# Patient Record
Sex: Female | Born: 1945 | Race: White | Hispanic: No | Smoking: Never smoker
Health system: Southern US, Community
[De-identification: ages and names within clinical notes are randomized; demographics above are authoritative.]

## PROBLEM LIST (undated history)

## (undated) DIAGNOSIS — IMO0001 Reserved for inherently not codable concepts without codable children: Secondary | ICD-10-CM

## (undated) DIAGNOSIS — L309 Dermatitis, unspecified: Secondary | ICD-10-CM

## (undated) DIAGNOSIS — Z87442 Personal history of urinary calculi: Secondary | ICD-10-CM

## (undated) DIAGNOSIS — I2510 Atherosclerotic heart disease of native coronary artery without angina pectoris: Secondary | ICD-10-CM

## (undated) DIAGNOSIS — K219 Gastro-esophageal reflux disease without esophagitis: Secondary | ICD-10-CM

## (undated) DIAGNOSIS — R350 Frequency of micturition: Secondary | ICD-10-CM

## (undated) DIAGNOSIS — M1990 Unspecified osteoarthritis, unspecified site: Secondary | ICD-10-CM

## (undated) DIAGNOSIS — J189 Pneumonia, unspecified organism: Secondary | ICD-10-CM

## (undated) DIAGNOSIS — N189 Chronic kidney disease, unspecified: Secondary | ICD-10-CM

## (undated) DIAGNOSIS — E785 Hyperlipidemia, unspecified: Secondary | ICD-10-CM

## (undated) DIAGNOSIS — K760 Fatty (change of) liver, not elsewhere classified: Secondary | ICD-10-CM

## (undated) DIAGNOSIS — Z8489 Family history of other specified conditions: Secondary | ICD-10-CM

## (undated) HISTORY — PX: CATARACT EXTRACTION: SUR2

## (undated) HISTORY — PX: APPENDECTOMY: SHX54

## (undated) HISTORY — DX: Atherosclerotic heart disease of native coronary artery without angina pectoris: I25.10

---

## 1984-03-28 HISTORY — PX: BREAST BIOPSY: SHX20

## 1999-07-23 ENCOUNTER — Other Ambulatory Visit: Admission: RE | Admit: 1999-07-23 | Discharge: 1999-07-23 | Payer: Self-pay | Admitting: *Deleted

## 2000-10-20 ENCOUNTER — Other Ambulatory Visit: Admission: RE | Admit: 2000-10-20 | Discharge: 2000-10-20 | Payer: Self-pay | Admitting: Obstetrics and Gynecology

## 2001-11-05 ENCOUNTER — Other Ambulatory Visit: Admission: RE | Admit: 2001-11-05 | Discharge: 2001-11-05 | Payer: Self-pay | Admitting: Obstetrics and Gynecology

## 2002-03-28 HISTORY — PX: JOINT REPLACEMENT: SHX530

## 2002-10-23 ENCOUNTER — Encounter: Payer: Self-pay | Admitting: Orthopedic Surgery

## 2002-10-28 ENCOUNTER — Inpatient Hospital Stay (HOSPITAL_COMMUNITY): Admission: RE | Admit: 2002-10-28 | Discharge: 2002-10-31 | Payer: Self-pay | Admitting: Orthopedic Surgery

## 2003-01-14 ENCOUNTER — Other Ambulatory Visit: Admission: RE | Admit: 2003-01-14 | Discharge: 2003-01-14 | Payer: Self-pay | Admitting: Obstetrics and Gynecology

## 2009-05-18 ENCOUNTER — Emergency Department (HOSPITAL_COMMUNITY): Admission: EM | Admit: 2009-05-18 | Discharge: 2009-05-18 | Payer: Self-pay | Admitting: Emergency Medicine

## 2010-06-18 LAB — URINALYSIS, ROUTINE W REFLEX MICROSCOPIC
Bilirubin Urine: NEGATIVE
Ketones, ur: 40 mg/dL — AB
Specific Gravity, Urine: 1.02 (ref 1.005–1.030)
Urobilinogen, UA: 1 mg/dL (ref 0.0–1.0)
pH: 6.5 (ref 5.0–8.0)

## 2010-06-18 LAB — CBC
RBC: 4.57 MIL/uL (ref 3.87–5.11)
WBC: 12.7 10*3/uL — ABNORMAL HIGH (ref 4.0–10.5)

## 2010-06-18 LAB — DIFFERENTIAL
Lymphs Abs: 1.3 10*3/uL (ref 0.7–4.0)
Monocytes Relative: 5 % (ref 3–12)
Neutro Abs: 10.8 10*3/uL — ABNORMAL HIGH (ref 1.7–7.7)
Neutrophils Relative %: 85 % — ABNORMAL HIGH (ref 43–77)

## 2010-06-18 LAB — POCT I-STAT, CHEM 8
Hemoglobin: 14.3 g/dL (ref 12.0–15.0)
Sodium: 138 mEq/L (ref 135–145)
TCO2: 27 mmol/L (ref 0–100)

## 2010-06-18 LAB — URINE MICROSCOPIC-ADD ON

## 2011-10-03 ENCOUNTER — Encounter (HOSPITAL_COMMUNITY): Admission: RE | Payer: Self-pay | Source: Ambulatory Visit

## 2011-10-03 ENCOUNTER — Ambulatory Visit (HOSPITAL_COMMUNITY): Admission: RE | Admit: 2011-10-03 | Payer: Self-pay | Source: Ambulatory Visit | Admitting: Orthopedic Surgery

## 2011-10-03 SURGERY — ARTHROPLASTY, KNEE, TOTAL
Anesthesia: Regional | Laterality: Right

## 2011-11-02 ENCOUNTER — Emergency Department (HOSPITAL_COMMUNITY)
Admission: EM | Admit: 2011-11-02 | Discharge: 2011-11-02 | Disposition: A | Discharge status: Home / Self Care | Payer: BC Managed Care – PPO | Attending: Emergency Medicine | Admitting: Emergency Medicine

## 2011-11-02 ENCOUNTER — Emergency Department (HOSPITAL_COMMUNITY): Payer: BC Managed Care – PPO

## 2011-11-02 ENCOUNTER — Encounter (HOSPITAL_COMMUNITY): Payer: Self-pay | Admitting: Emergency Medicine

## 2011-11-02 DIAGNOSIS — N1330 Unspecified hydronephrosis: Secondary | ICD-10-CM | POA: Insufficient documentation

## 2011-11-02 DIAGNOSIS — N201 Calculus of ureter: Secondary | ICD-10-CM | POA: Insufficient documentation

## 2011-11-02 DIAGNOSIS — R109 Unspecified abdominal pain: Secondary | ICD-10-CM | POA: Insufficient documentation

## 2011-11-02 DIAGNOSIS — N200 Calculus of kidney: Secondary | ICD-10-CM

## 2011-11-02 DIAGNOSIS — Z87442 Personal history of urinary calculi: Secondary | ICD-10-CM | POA: Insufficient documentation

## 2011-11-02 DIAGNOSIS — Z79899 Other long term (current) drug therapy: Secondary | ICD-10-CM | POA: Insufficient documentation

## 2011-11-02 HISTORY — DX: Unspecified osteoarthritis, unspecified site: M19.90

## 2011-11-02 HISTORY — DX: Gastro-esophageal reflux disease without esophagitis: K21.9

## 2011-11-02 HISTORY — DX: Reserved for inherently not codable concepts without codable children: IMO0001

## 2011-11-02 HISTORY — DX: Hyperlipidemia, unspecified: E78.5

## 2011-11-02 LAB — CBC WITH DIFFERENTIAL/PLATELET
Basophils Absolute: 0.1 10*3/uL (ref 0.0–0.1)
Eosinophils Relative: 3 % (ref 0–5)
HCT: 38.4 % (ref 36.0–46.0)
Hemoglobin: 13.3 g/dL (ref 12.0–15.0)
Lymphocytes Relative: 42 % (ref 12–46)
Lymphs Abs: 2.5 10*3/uL (ref 0.7–4.0)
MCH: 29.9 pg (ref 26.0–34.0)
MCV: 86.3 fL (ref 78.0–100.0)
Monocytes Absolute: 0.6 10*3/uL (ref 0.1–1.0)
Monocytes Relative: 10 % (ref 3–12)
Neutro Abs: 2.6 10*3/uL (ref 1.7–7.7)
Neutrophils Relative %: 44 % (ref 43–77)
RDW: 13.3 % (ref 11.5–15.5)
WBC: 5.9 10*3/uL (ref 4.0–10.5)

## 2011-11-02 LAB — COMPREHENSIVE METABOLIC PANEL
ALT: 26 U/L (ref 0–35)
Albumin: 4.2 g/dL (ref 3.5–5.2)
Alkaline Phosphatase: 88 U/L (ref 39–117)
BUN: 16 mg/dL (ref 6–23)
Calcium: 9.2 mg/dL (ref 8.4–10.5)
Chloride: 100 mEq/L (ref 96–112)
GFR calc Af Amer: >90 mL/min (ref >90)
Potassium: 3.6 mEq/L (ref 3.5–5.1)
Sodium: 135 mEq/L (ref 135–145)
Total Protein: 7.1 g/dL (ref 6.0–8.3)

## 2011-11-02 LAB — URINALYSIS, ROUTINE W REFLEX MICROSCOPIC
Bilirubin Urine: NEGATIVE
Glucose, UA: NEGATIVE mg/dL
Ketones, ur: NEGATIVE mg/dL
pH: 5.5 (ref 5.0–8.0)

## 2011-11-02 LAB — URINE MICROSCOPIC-ADD ON

## 2011-11-02 MED ORDER — ONDANSETRON HCL 4 MG/2ML IJ SOLN
4.0000 mg | Freq: Once | INTRAMUSCULAR | Status: AC
  Administered 2011-11-02: 4 mg via INTRAVENOUS
  Filled 2011-11-02: qty 2

## 2011-11-02 MED ORDER — FENTANYL CITRATE 0.05 MG/ML IJ SOLN
50.0000 ug | INTRAMUSCULAR | Status: AC
  Administered 2011-11-02: 50 ug via INTRAVENOUS
  Filled 2011-11-02: qty 4

## 2011-11-02 MED ORDER — PROMETHAZINE HCL 25 MG PO TABS: 12.5000 mg | ORAL_TABLET | Freq: Four times a day (QID) | ORAL | Status: DC | PRN

## 2011-11-02 MED ORDER — HYDROCODONE-ACETAMINOPHEN 5-325 MG PO TABS: 1.0000 | ORAL_TABLET | Freq: Four times a day (QID) | ORAL | Status: AC | PRN

## 2011-11-02 MED ORDER — HYDROMORPHONE HCL PF 1 MG/ML IJ SOLN
1.0000 mg | Freq: Once | INTRAMUSCULAR | Status: AC
  Administered 2011-11-02: 1 mg via INTRAVENOUS
  Filled 2011-11-02: qty 1

## 2011-11-02 MED ORDER — KETOROLAC TROMETHAMINE 30 MG/ML IJ SOLN
30.0000 mg | Freq: Once | INTRAMUSCULAR | Status: AC
  Administered 2011-11-02: 30 mg via INTRAVENOUS
  Filled 2011-11-02: qty 1

## 2011-11-02 NOTE — ED Notes (Signed)
Pt presenting to ed with c/o left side flank pain onset x 45 minutes ago pt with positive nausea no vomiting. Pt denies hematuria pt states history of kidney stones and the pain feels the same

## 2011-11-02 NOTE — ED Provider Notes (Signed)
History     CSN: [id number]  Arrival date & time 11/02/11  1018   First MD Initiated Contact with Patient 11/02/11 1048      Chief Complaint  Patient presents with  . Flank Pain    (Consider location/radiation/quality/duration/timing/severity/associated sxs/prior treatment) HPI The patient presents with new left flank pain.  She notes that she was in her usual state of health until approximately one hour prior to my exam.  Since onset she said pain persistently about the left flank with mild radiation towards the inguinal crease.  The pain is sharp, crampy, not relieved by anything.  She notes mild associated nausea, without vomiting.  She does have a history of kidney stones, notes that this feels similar.  She denies any ongoing dysuria, hematuria, dyspnea, fevers, chills, chest pain or other focal complaints Past Medical History  Diagnosis Date  . Hyperlipidemia   . Reflux   . Arthritis     Past Surgical History  Procedure Date  . Cesarean section   . Joint replacement     No family history on file.  History  Substance Use Topics  . Smoking status: Never Smoker   . Smokeless tobacco: Not on file  . Alcohol Use: No    OB History    Grav Para Term Preterm Abortions TAB SAB Ect Mult Living                  Review of Systems  Constitutional:       HPI  HENT:       HPI otherwise negative  Eyes: Negative.   Respiratory:       HPI, otherwise negative  Cardiovascular:       HPI, otherwise nmegative  Gastrointestinal: Negative for vomiting.  Genitourinary:       HPI, otherwise negative  Musculoskeletal:       HPI, otherwise negative  Skin: Negative.   Neurological: Negative for syncope.    Allergies  Sulfur  Home Medications   Current Outpatient Rx  Name Route Sig Dispense Refill  . VITAMIN D PO Oral Take 1 tablet by mouth daily.    Marland Kitchen CITALOPRAM HYDROBROMIDE 10 MG PO TABS Oral Take 20 mg by mouth daily.    . OMEGA-3 FATTY ACIDS 1000 MG PO CAPS Oral  Take 1 g by mouth 2 (two) times daily.    Marland Kitchen GLUCOSAMINE-CHONDROITIN 500-400 MG PO TABS Oral Take 1 tablet by mouth 3 (three) times daily.    Marland Kitchen NIACIN 500 MG PO TABS Oral Take 1,000 mg by mouth 2 (two) times daily.    Marland Kitchen OMEPRAZOLE 10 MG PO CPDR Oral Take 10 mg by mouth daily.    . RED YEAST RICE EXTRACT PO Oral Take 1 capsule by mouth 2 (two) times daily.    Marland Kitchen ROSUVASTATIN CALCIUM 20 MG PO TABS Oral Take 20 mg by mouth daily.    Marland Kitchen HYDROCODONE-ACETAMINOPHEN 5-325 MG PO TABS Oral Take 1 tablet by mouth every 6 (six) hours as needed for pain. 15 tablet 0  . PROMETHAZINE HCL 25 MG PO TABS Oral Take 0.5 tablets (12.5 mg total) by mouth every 6 (six) hours as needed for nausea. 15 tablet 0    BP 117/64  Pulse 61  Temp 97.9 F (36.6 C) (Oral)  Resp 20  SpO2 100%  Physical Exam  Nursing note and vitals reviewed. Constitutional: She is oriented to person, place, and time. She appears well-developed and well-nourished. No distress.  HENT:  Head: Normocephalic and atraumatic.  Eyes: Conjunctivae and EOM are normal.  Cardiovascular: Normal rate and regular rhythm.   Pulmonary/Chest: Effort normal and breath sounds normal. No stridor. No respiratory distress.  Abdominal: She exhibits no distension. There is tenderness in the left upper quadrant. There is CVA tenderness. There is no rigidity, no rebound and no guarding.    Musculoskeletal: She exhibits no edema.  Neurological: She is alert and oriented to person, place, and time. No cranial nerve deficit.  Skin: Skin is warm and dry.  Psychiatric: She has a normal mood and affect.    ED Course  Procedures (including critical care time)  Labs Reviewed  COMPREHENSIVE METABOLIC PANEL - Abnormal; Notable for the following:    Glucose, Bld 163 (*)     All other components within normal limits  URINALYSIS, ROUTINE W REFLEX MICROSCOPIC - Abnormal; Notable for the following:    Hgb urine dipstick MODERATE (*)     Leukocytes, UA SMALL (*)     All  other components within normal limits  URINE MICROSCOPIC-ADD ON - Abnormal; Notable for the following:    Squamous Epithelial / LPF FEW (*)     Bacteria, UA MANY (*)     All other components within normal limits  CBC WITH DIFFERENTIAL   Ct Abdomen Pelvis Wo Contrast  11/02/2011  *RADIOLOGY REPORT*  Clinical Data: Left-sided flank pain  CT ABDOMEN AND PELVIS WITHOUT CONTRAST  Technique:  Multidetector CT imaging of the abdomen and pelvis was performed following the standard protocol without intravenous contrast.  Comparison: None.  Findings:  Renal:  There is mild renal edema and perinephric stranding on the left.  There is mild pelvicaliectasis and hydroureter on the left. Hydroureter is minimal.  This obstructive pattern is secondary to a small calculus in the distal left ureter measuring 2 mm and positioned approximately 1 cm from the vesicoureteral junction. No right nephrolithiasis or ureterolithiasis.  No additional left nephrolithiasis.  Lung bases are clear.  No pericardial fluid.  No focal hepatic lesion on this noncontrast exam.  The gallbladder, pancreas, spleen, adrenal glands are normal.  The stomach, small bowel, and colon are unremarkable.  Abdominal aorta normal caliber.  No retroperitoneal periportal lymphadenopathy.  No free fluid the pelvis.  The uterus and ovaries and bladder are normal.  No pelvic lymphadenopathy. Review of  bone windows demonstrates no aggressive osseous lesions.  IMPRESSION:  1.  Small partial obstructing calculus within distal left ureter just proximal to the vesicoureteral junction. 2.  No nephrolithiasis identified. 3.  Significant degenerative changes of the spine.  Original Report Authenticated By: Genevive Bi, M.D.     1. Nephrolithiasis     1:32 PM Patient feels better  MDM  This generally well female with a history of kidney stones now presents with new left flank pain.  Given the patient's description of the pain, her history, there is any  suspicion of kidney stone.  This was demonstrated on CT, with mild hydroureter, mild hydronephrosis.  There is no leukocytosis, fever, patient is neither tachycardic or hypoxic, reassuring for the low suspicion of concurrent infection.  The patient improved substantially with analgesics, fluids, was discharged in stable condition with urology followup   Gerhard Munch, MD 11/02/11 [phone]

## 2012-04-13 DIAGNOSIS — J019 Acute sinusitis, unspecified: Secondary | ICD-10-CM | POA: Diagnosis not present

## 2012-04-16 DIAGNOSIS — M545 Low back pain, unspecified: Secondary | ICD-10-CM | POA: Diagnosis not present

## 2012-04-16 DIAGNOSIS — M546 Pain in thoracic spine: Secondary | ICD-10-CM | POA: Diagnosis not present

## 2012-04-16 DIAGNOSIS — IMO0001 Reserved for inherently not codable concepts without codable children: Secondary | ICD-10-CM | POA: Diagnosis not present

## 2012-04-16 DIAGNOSIS — M542 Cervicalgia: Secondary | ICD-10-CM | POA: Diagnosis not present

## 2012-04-17 DIAGNOSIS — M545 Low back pain, unspecified: Secondary | ICD-10-CM | POA: Diagnosis not present

## 2012-04-17 DIAGNOSIS — IMO0001 Reserved for inherently not codable concepts without codable children: Secondary | ICD-10-CM | POA: Diagnosis not present

## 2012-04-17 DIAGNOSIS — M546 Pain in thoracic spine: Secondary | ICD-10-CM | POA: Diagnosis not present

## 2012-04-17 DIAGNOSIS — M542 Cervicalgia: Secondary | ICD-10-CM | POA: Diagnosis not present

## 2012-04-18 DIAGNOSIS — IMO0001 Reserved for inherently not codable concepts without codable children: Secondary | ICD-10-CM | POA: Diagnosis not present

## 2012-04-18 DIAGNOSIS — M545 Low back pain, unspecified: Secondary | ICD-10-CM | POA: Diagnosis not present

## 2012-04-18 DIAGNOSIS — M546 Pain in thoracic spine: Secondary | ICD-10-CM | POA: Diagnosis not present

## 2012-04-18 DIAGNOSIS — M542 Cervicalgia: Secondary | ICD-10-CM | POA: Diagnosis not present

## 2012-04-19 DIAGNOSIS — M542 Cervicalgia: Secondary | ICD-10-CM | POA: Diagnosis not present

## 2012-04-19 DIAGNOSIS — IMO0001 Reserved for inherently not codable concepts without codable children: Secondary | ICD-10-CM | POA: Diagnosis not present

## 2012-04-19 DIAGNOSIS — M545 Low back pain, unspecified: Secondary | ICD-10-CM | POA: Diagnosis not present

## 2012-04-19 DIAGNOSIS — M546 Pain in thoracic spine: Secondary | ICD-10-CM | POA: Diagnosis not present

## 2012-04-20 DIAGNOSIS — Z01419 Encounter for gynecological examination (general) (routine) without abnormal findings: Secondary | ICD-10-CM | POA: Diagnosis not present

## 2012-04-20 DIAGNOSIS — R87610 Atypical squamous cells of undetermined significance on cytologic smear of cervix (ASC-US): Secondary | ICD-10-CM | POA: Diagnosis not present

## 2012-04-20 DIAGNOSIS — Z124 Encounter for screening for malignant neoplasm of cervix: Secondary | ICD-10-CM | POA: Diagnosis not present

## 2012-04-23 DIAGNOSIS — M25569 Pain in unspecified knee: Secondary | ICD-10-CM | POA: Diagnosis not present

## 2012-04-24 DIAGNOSIS — M546 Pain in thoracic spine: Secondary | ICD-10-CM | POA: Diagnosis not present

## 2012-04-24 DIAGNOSIS — M542 Cervicalgia: Secondary | ICD-10-CM | POA: Diagnosis not present

## 2012-04-24 DIAGNOSIS — IMO0001 Reserved for inherently not codable concepts without codable children: Secondary | ICD-10-CM | POA: Diagnosis not present

## 2012-04-24 DIAGNOSIS — M545 Low back pain, unspecified: Secondary | ICD-10-CM | POA: Diagnosis not present

## 2012-04-25 DIAGNOSIS — M545 Low back pain, unspecified: Secondary | ICD-10-CM | POA: Diagnosis not present

## 2012-04-25 DIAGNOSIS — M546 Pain in thoracic spine: Secondary | ICD-10-CM | POA: Diagnosis not present

## 2012-04-25 DIAGNOSIS — M542 Cervicalgia: Secondary | ICD-10-CM | POA: Diagnosis not present

## 2012-04-25 DIAGNOSIS — IMO0001 Reserved for inherently not codable concepts without codable children: Secondary | ICD-10-CM | POA: Diagnosis not present

## 2012-04-30 DIAGNOSIS — IMO0001 Reserved for inherently not codable concepts without codable children: Secondary | ICD-10-CM | POA: Diagnosis not present

## 2012-04-30 DIAGNOSIS — M545 Low back pain, unspecified: Secondary | ICD-10-CM | POA: Diagnosis not present

## 2012-04-30 DIAGNOSIS — M546 Pain in thoracic spine: Secondary | ICD-10-CM | POA: Diagnosis not present

## 2012-04-30 DIAGNOSIS — M542 Cervicalgia: Secondary | ICD-10-CM | POA: Diagnosis not present

## 2012-05-02 DIAGNOSIS — M545 Low back pain, unspecified: Secondary | ICD-10-CM | POA: Diagnosis not present

## 2012-05-02 DIAGNOSIS — IMO0001 Reserved for inherently not codable concepts without codable children: Secondary | ICD-10-CM | POA: Diagnosis not present

## 2012-05-02 DIAGNOSIS — M542 Cervicalgia: Secondary | ICD-10-CM | POA: Diagnosis not present

## 2012-05-02 DIAGNOSIS — M546 Pain in thoracic spine: Secondary | ICD-10-CM | POA: Diagnosis not present

## 2012-05-04 DIAGNOSIS — M1990 Unspecified osteoarthritis, unspecified site: Secondary | ICD-10-CM | POA: Diagnosis not present

## 2012-05-07 DIAGNOSIS — IMO0001 Reserved for inherently not codable concepts without codable children: Secondary | ICD-10-CM | POA: Diagnosis not present

## 2012-05-07 DIAGNOSIS — M545 Low back pain, unspecified: Secondary | ICD-10-CM | POA: Diagnosis not present

## 2012-05-07 DIAGNOSIS — M546 Pain in thoracic spine: Secondary | ICD-10-CM | POA: Diagnosis not present

## 2012-05-07 DIAGNOSIS — M542 Cervicalgia: Secondary | ICD-10-CM | POA: Diagnosis not present

## 2012-05-08 DIAGNOSIS — M546 Pain in thoracic spine: Secondary | ICD-10-CM | POA: Diagnosis not present

## 2012-05-08 DIAGNOSIS — M545 Low back pain, unspecified: Secondary | ICD-10-CM | POA: Diagnosis not present

## 2012-05-08 DIAGNOSIS — M542 Cervicalgia: Secondary | ICD-10-CM | POA: Diagnosis not present

## 2012-05-08 DIAGNOSIS — IMO0001 Reserved for inherently not codable concepts without codable children: Secondary | ICD-10-CM | POA: Diagnosis not present

## 2012-05-14 DIAGNOSIS — M546 Pain in thoracic spine: Secondary | ICD-10-CM | POA: Diagnosis not present

## 2012-05-14 DIAGNOSIS — M542 Cervicalgia: Secondary | ICD-10-CM | POA: Diagnosis not present

## 2012-05-14 DIAGNOSIS — M545 Low back pain, unspecified: Secondary | ICD-10-CM | POA: Diagnosis not present

## 2012-05-14 DIAGNOSIS — IMO0001 Reserved for inherently not codable concepts without codable children: Secondary | ICD-10-CM | POA: Diagnosis not present

## 2012-05-16 DIAGNOSIS — M545 Low back pain, unspecified: Secondary | ICD-10-CM | POA: Diagnosis not present

## 2012-05-16 DIAGNOSIS — M546 Pain in thoracic spine: Secondary | ICD-10-CM | POA: Diagnosis not present

## 2012-05-16 DIAGNOSIS — IMO0001 Reserved for inherently not codable concepts without codable children: Secondary | ICD-10-CM | POA: Diagnosis not present

## 2012-05-16 DIAGNOSIS — M542 Cervicalgia: Secondary | ICD-10-CM | POA: Diagnosis not present

## 2012-05-21 DIAGNOSIS — M542 Cervicalgia: Secondary | ICD-10-CM | POA: Diagnosis not present

## 2012-05-21 DIAGNOSIS — M545 Low back pain, unspecified: Secondary | ICD-10-CM | POA: Diagnosis not present

## 2012-05-21 DIAGNOSIS — M546 Pain in thoracic spine: Secondary | ICD-10-CM | POA: Diagnosis not present

## 2012-05-21 DIAGNOSIS — IMO0001 Reserved for inherently not codable concepts without codable children: Secondary | ICD-10-CM | POA: Diagnosis not present

## 2012-05-28 ENCOUNTER — Encounter (HOSPITAL_COMMUNITY): Payer: Self-pay | Admitting: Pharmacy Technician

## 2012-05-29 ENCOUNTER — Other Ambulatory Visit: Payer: Self-pay | Admitting: Orthopedic Surgery

## 2012-06-01 ENCOUNTER — Other Ambulatory Visit (HOSPITAL_COMMUNITY): Payer: BC Managed Care – PPO

## 2012-06-04 NOTE — Pre-Procedure Instructions (Signed)
Priscilla Taylor  06/04/2012   Your procedure is scheduled on:  Monday June 11, 2012.  Report to [facility] at 8:00 AM.  Call this number if you have problems the morning of surgery: [phone]   Remember:   Do not eat food or drink liquids after midnight.   Take these medicines the morning of surgery with A SIP OF WATER: Citalopram (Celexa), Omeprazole (Prilosec)   Do not wear jewelry, make-up or nail polish.  Do not wear lotions, powders, or perfumes. You may NOT wear deodorant.  Do not shave 48 hours prior to surgery.   Do not bring valuables to the hospital.  Contacts, dentures or bridgework may not be worn into surgery.  Leave suitcase in the car. After surgery it may be brought to your room.  For patients admitted to the hospital, checkout time is 11:00 AM the day of  discharge.   Patients discharged the day of surgery will not be allowed to drive  home.  Name and phone number of your driver: Family/Friend  Special Instructions: Shower using CHG 2 nights before surgery and the night before surgery.  If you shower the day of surgery use CHG.  Use special wash - you have one bottle of CHG for all showers.  You should use approximately 1/3 of the bottle for each shower.   Please read over the following fact sheets that you were given: Pain Booklet, Coughing and Deep Breathing, Blood Transfusion Information, Total Joint Packet, MRSA Information and Surgical Site Infection Prevention

## 2012-06-05 ENCOUNTER — Encounter (HOSPITAL_COMMUNITY)
Admission: RE | Admit: 2012-06-05 | Discharge: 2012-06-05 | Disposition: A | Discharge status: Home / Self Care | Payer: Medicare Other | Source: Ambulatory Visit | Attending: Orthopedic Surgery | Admitting: Orthopedic Surgery

## 2012-06-05 ENCOUNTER — Encounter (HOSPITAL_COMMUNITY): Payer: Self-pay

## 2012-06-05 DIAGNOSIS — D62 Acute posthemorrhagic anemia: Secondary | ICD-10-CM | POA: Diagnosis not present

## 2012-06-05 DIAGNOSIS — IMO0002 Reserved for concepts with insufficient information to code with codable children: Secondary | ICD-10-CM | POA: Diagnosis not present

## 2012-06-05 DIAGNOSIS — Z01812 Encounter for preprocedural laboratory examination: Secondary | ICD-10-CM | POA: Diagnosis not present

## 2012-06-05 DIAGNOSIS — M25569 Pain in unspecified knee: Secondary | ICD-10-CM | POA: Diagnosis not present

## 2012-06-05 DIAGNOSIS — M1710 Unilateral primary osteoarthritis, unspecified knee: Secondary | ICD-10-CM | POA: Diagnosis not present

## 2012-06-05 DIAGNOSIS — Z96659 Presence of unspecified artificial knee joint: Secondary | ICD-10-CM | POA: Diagnosis not present

## 2012-06-05 DIAGNOSIS — J984 Other disorders of lung: Secondary | ICD-10-CM | POA: Diagnosis not present

## 2012-06-05 DIAGNOSIS — G8918 Other acute postprocedural pain: Secondary | ICD-10-CM | POA: Diagnosis not present

## 2012-06-05 DIAGNOSIS — E785 Hyperlipidemia, unspecified: Secondary | ICD-10-CM | POA: Diagnosis not present

## 2012-06-05 DIAGNOSIS — K219 Gastro-esophageal reflux disease without esophagitis: Secondary | ICD-10-CM | POA: Diagnosis not present

## 2012-06-05 DIAGNOSIS — Z01818 Encounter for other preprocedural examination: Secondary | ICD-10-CM | POA: Diagnosis not present

## 2012-06-05 HISTORY — DX: Chronic kidney disease, unspecified: N18.9

## 2012-06-05 HISTORY — DX: Pneumonia, unspecified organism: J18.9

## 2012-06-05 HISTORY — DX: Dermatitis, unspecified: L30.9

## 2012-06-05 HISTORY — DX: Frequency of micturition: R35.0

## 2012-06-05 HISTORY — DX: Gastro-esophageal reflux disease without esophagitis: K21.9

## 2012-06-05 LAB — CBC WITH DIFFERENTIAL/PLATELET
Basophils Absolute: 0.1 10*3/uL (ref 0.0–0.1)
Basophils Relative: 1 % (ref 0–1)
Eosinophils Relative: 2 % (ref 0–5)
Lymphocytes Relative: 40 % (ref 12–46)
MCV: 85.9 fL (ref 78.0–100.0)
Platelets: 278 10*3/uL (ref 150–400)
RDW: 13.5 % (ref 11.5–15.5)
WBC: 5.7 10*3/uL (ref 4.0–10.5)

## 2012-06-05 LAB — COMPREHENSIVE METABOLIC PANEL
ALT: 21 U/L (ref 0–35)
AST: 20 U/L (ref 0–37)
Albumin: 3.9 g/dL (ref 3.5–5.2)
CO2: 29 mEq/L (ref 19–32)
Calcium: 9.5 mg/dL (ref 8.4–10.5)
GFR calc non Af Amer: >90 mL/min (ref >90)
Sodium: 140 mEq/L (ref 135–145)
Total Protein: 7.3 g/dL (ref 6.0–8.3)

## 2012-06-05 LAB — APTT: aPTT: 33 seconds (ref 24–37)

## 2012-06-05 LAB — URINALYSIS, ROUTINE W REFLEX MICROSCOPIC
Glucose, UA: NEGATIVE mg/dL
Nitrite: NEGATIVE
Specific Gravity, Urine: 1.019 (ref 1.005–1.030)
pH: 7.5 (ref 5.0–8.0)

## 2012-06-05 LAB — TYPE AND SCREEN

## 2012-06-05 LAB — SURGICAL PCR SCREEN: MRSA, PCR: NEGATIVE

## 2012-06-05 LAB — URINE MICROSCOPIC-ADD ON

## 2012-06-05 LAB — ABO/RH: ABO/RH(D): O POS

## 2012-06-05 NOTE — Progress Notes (Signed)
Patient denied having a stress test, cardiac cath, or sleep study. PCP is Dr. Donovan Kail. Advance Directives (POA & Living Will) placed on chart.

## 2012-06-06 DIAGNOSIS — M25569 Pain in unspecified knee: Secondary | ICD-10-CM | POA: Diagnosis not present

## 2012-06-07 LAB — URINE CULTURE: Colony Count: >=100000

## 2012-06-10 MED ORDER — VANCOMYCIN HCL IN DEXTROSE 1-5 GM/200ML-% IV SOLN
1000.0000 mg | INTRAVENOUS | Status: AC
  Administered 2012-06-11: 1000 mg via INTRAVENOUS
  Filled 2012-06-10: qty 200

## 2012-06-11 ENCOUNTER — Encounter (HOSPITAL_COMMUNITY): Payer: Self-pay | Admitting: Anesthesiology

## 2012-06-11 ENCOUNTER — Encounter (HOSPITAL_COMMUNITY)
Admission: RE | Disposition: A | Discharge status: Home Health | Payer: Self-pay | Source: Ambulatory Visit | Attending: Orthopedic Surgery

## 2012-06-11 ENCOUNTER — Inpatient Hospital Stay (HOSPITAL_COMMUNITY): Payer: Medicare Other | Admitting: Anesthesiology

## 2012-06-11 ENCOUNTER — Inpatient Hospital Stay (HOSPITAL_COMMUNITY)
Admission: RE | Admit: 2012-06-11 | Discharge: 2012-06-13 | DRG: 470 | Disposition: A | Discharge status: Home Health | Payer: Medicare Other | Source: Ambulatory Visit | Attending: Orthopedic Surgery | Admitting: Orthopedic Surgery

## 2012-06-11 DIAGNOSIS — D62 Acute posthemorrhagic anemia: Secondary | ICD-10-CM | POA: Diagnosis not present

## 2012-06-11 DIAGNOSIS — E785 Hyperlipidemia, unspecified: Secondary | ICD-10-CM | POA: Diagnosis not present

## 2012-06-11 DIAGNOSIS — K219 Gastro-esophageal reflux disease without esophagitis: Secondary | ICD-10-CM | POA: Diagnosis present

## 2012-06-11 DIAGNOSIS — Z96659 Presence of unspecified artificial knee joint: Secondary | ICD-10-CM

## 2012-06-11 DIAGNOSIS — M1712 Unilateral primary osteoarthritis, left knee: Secondary | ICD-10-CM

## 2012-06-11 DIAGNOSIS — M1710 Unilateral primary osteoarthritis, unspecified knee: Principal | ICD-10-CM | POA: Diagnosis present

## 2012-06-11 DIAGNOSIS — Z01812 Encounter for preprocedural laboratory examination: Secondary | ICD-10-CM

## 2012-06-11 HISTORY — PX: TOTAL KNEE ARTHROPLASTY: SHX125

## 2012-06-11 LAB — CBC
HCT: 35.9 % — ABNORMAL LOW (ref 36.0–46.0)
MCHC: 34.3 g/dL (ref 30.0–36.0)
Platelets: 297 10*3/uL (ref 150–400)
RDW: 13.6 % (ref 11.5–15.5)
WBC: 16.7 10*3/uL — ABNORMAL HIGH (ref 4.0–10.5)

## 2012-06-11 LAB — CREATININE, SERUM
Creatinine, Ser: 0.47 mg/dL — ABNORMAL LOW (ref 0.50–1.10)
GFR calc non Af Amer: >90 mL/min (ref >90)

## 2012-06-11 SURGERY — ARTHROPLASTY, KNEE, TOTAL
Anesthesia: General | Site: Knee | Laterality: Right | Wound class: Clean

## 2012-06-11 MED ORDER — ACETAMINOPHEN 650 MG RE SUPP: 650.0000 mg | Freq: Four times a day (QID) | RECTAL | Status: DC | PRN

## 2012-06-11 MED ORDER — OXYCODONE HCL 5 MG PO TABS
5.0000 mg | ORAL_TABLET | ORAL | Status: DC | PRN
  Administered 2012-06-12: 5 mg via ORAL
  Administered 2012-06-12 (×2): 10 mg via ORAL
  Administered 2012-06-12: 5 mg via ORAL
  Administered 2012-06-13: 10 mg via ORAL
  Filled 2012-06-11: qty 2
  Filled 2012-06-11: qty 1
  Filled 2012-06-11 (×2): qty 2
  Filled 2012-06-11: qty 1

## 2012-06-11 MED ORDER — GLYCOPYRROLATE 0.2 MG/ML IJ SOLN
INTRAMUSCULAR | Status: DC | PRN
  Administered 2012-06-11: 0.4 mg via INTRAVENOUS

## 2012-06-11 MED ORDER — SODIUM CHLORIDE 0.9 % IV SOLN: INTRAVENOUS | Status: DC

## 2012-06-11 MED ORDER — PROPOFOL 10 MG/ML IV BOLUS
INTRAVENOUS | Status: DC | PRN
  Administered 2012-06-11: 200 mg via INTRAVENOUS

## 2012-06-11 MED ORDER — SENNOSIDES-DOCUSATE SODIUM 8.6-50 MG PO TABS: 1.0000 | ORAL_TABLET | Freq: Every evening | ORAL | Status: DC | PRN

## 2012-06-11 MED ORDER — ONDANSETRON HCL 4 MG/2ML IJ SOLN
INTRAMUSCULAR | Status: DC | PRN
  Administered 2012-06-11: 4 mg via INTRAVENOUS

## 2012-06-11 MED ORDER — ACETAMINOPHEN 10 MG/ML IV SOLN
1000.0000 mg | Freq: Four times a day (QID) | INTRAVENOUS | Status: AC
  Administered 2012-06-11 – 2012-06-12 (×4): 1000 mg via INTRAVENOUS
  Filled 2012-06-11 (×4): qty 100

## 2012-06-11 MED ORDER — NIACIN 500 MG PO TABS
500.0000 mg | ORAL_TABLET | Freq: Four times a day (QID) | ORAL | Status: DC
Start: 2012-06-11 — End: 2012-06-13
  Administered 2012-06-11 – 2012-06-13 (×6): 500 mg via ORAL
  Filled 2012-06-11 (×10): qty 1

## 2012-06-11 MED ORDER — ALUM & MAG HYDROXIDE-SIMETH 200-200-20 MG/5ML PO SUSP: 30.0000 mL | ORAL | Status: DC | PRN

## 2012-06-11 MED ORDER — HYDROMORPHONE HCL PF 1 MG/ML IJ SOLN
1.0000 mg | INTRAMUSCULAR | Status: DC | PRN
Start: 2012-06-11 — End: 2012-06-13

## 2012-06-11 MED ORDER — CITALOPRAM HYDROBROMIDE 20 MG PO TABS
20.0000 mg | ORAL_TABLET | Freq: Every day | ORAL | Status: DC
  Administered 2012-06-12 – 2012-06-13 (×2): 20 mg via ORAL
  Filled 2012-06-11 (×2): qty 1

## 2012-06-11 MED ORDER — ATORVASTATIN CALCIUM 40 MG PO TABS
40.0000 mg | ORAL_TABLET | Freq: Every day | ORAL | Status: DC
  Administered 2012-06-11 – 2012-06-12 (×2): 40 mg via ORAL
  Filled 2012-06-11 (×3): qty 1

## 2012-06-11 MED ORDER — METOCLOPRAMIDE HCL 5 MG/ML IJ SOLN: 10.0000 mg | Freq: Once | INTRAMUSCULAR | Status: DC | PRN

## 2012-06-11 MED ORDER — ACETAMINOPHEN 325 MG PO TABS: 650.0000 mg | ORAL_TABLET | Freq: Four times a day (QID) | ORAL | Status: DC | PRN

## 2012-06-11 MED ORDER — MENTHOL 3 MG MT LOZG: 1.0000 | LOZENGE | OROMUCOSAL | Status: DC | PRN

## 2012-06-11 MED ORDER — OXYCODONE HCL 5 MG PO TABS: 5.0000 mg | ORAL_TABLET | Freq: Once | ORAL | Status: DC | PRN

## 2012-06-11 MED ORDER — PHENOL 1.4 % MT LIQD: 1.0000 | OROMUCOSAL | Status: DC | PRN

## 2012-06-11 MED ORDER — FLEET ENEMA 7-19 GM/118ML RE ENEM: 1.0000 | ENEMA | Freq: Once | RECTAL | Status: AC | PRN

## 2012-06-11 MED ORDER — FENTANYL CITRATE 0.05 MG/ML IJ SOLN
INTRAMUSCULAR | Status: AC
  Administered 2012-06-11: 50 ug
  Filled 2012-06-11: qty 2

## 2012-06-11 MED ORDER — METOCLOPRAMIDE HCL 5 MG/ML IJ SOLN: 5.0000 mg | Freq: Three times a day (TID) | INTRAMUSCULAR | Status: DC | PRN

## 2012-06-11 MED ORDER — ZOLPIDEM TARTRATE 5 MG PO TABS: 5.0000 mg | ORAL_TABLET | Freq: Every evening | ORAL | Status: DC | PRN

## 2012-06-11 MED ORDER — BUPIVACAINE ON-Q PAIN PUMP (FOR ORDER SET NO CHG)
INJECTION | Status: DC
  Filled 2012-06-11: qty 1

## 2012-06-11 MED ORDER — ARTIFICIAL TEARS OP OINT
TOPICAL_OINTMENT | OPHTHALMIC | Status: DC | PRN
  Administered 2012-06-11: 1 via OPHTHALMIC

## 2012-06-11 MED ORDER — MIDAZOLAM HCL 2 MG/2ML IJ SOLN
INTRAMUSCULAR | Status: AC
  Administered 2012-06-11: 1 mg
  Filled 2012-06-11: qty 2

## 2012-06-11 MED ORDER — ROCURONIUM BROMIDE 100 MG/10ML IV SOLN
INTRAVENOUS | Status: DC | PRN
  Administered 2012-06-11: 50 mg via INTRAVENOUS

## 2012-06-11 MED ORDER — SODIUM CHLORIDE 0.9 % IV SOLN
INTRAVENOUS | Status: DC
  Administered 2012-06-11 – 2012-06-12 (×2): via INTRAVENOUS

## 2012-06-11 MED ORDER — ONDANSETRON HCL 4 MG PO TABS: 4.0000 mg | ORAL_TABLET | Freq: Four times a day (QID) | ORAL | Status: DC | PRN

## 2012-06-11 MED ORDER — BUPIVACAINE-EPINEPHRINE PF 0.5-1:200000 % IJ SOLN
INTRAMUSCULAR | Status: DC | PRN
  Administered 2012-06-11: 30 mL

## 2012-06-11 MED ORDER — ONDANSETRON HCL 4 MG/2ML IJ SOLN: 4.0000 mg | Freq: Four times a day (QID) | INTRAMUSCULAR | Status: DC | PRN

## 2012-06-11 MED ORDER — BUPIVACAINE 0.25 % ON-Q PUMP SINGLE CATH 300ML
INJECTION | Status: DC | PRN
  Administered 2012-06-11: 300 mL

## 2012-06-11 MED ORDER — BISACODYL 5 MG PO TBEC: 5.0000 mg | DELAYED_RELEASE_TABLET | Freq: Every day | ORAL | Status: DC | PRN

## 2012-06-11 MED ORDER — EPHEDRINE SULFATE 50 MG/ML IJ SOLN
INTRAMUSCULAR | Status: DC | PRN
  Administered 2012-06-11 (×2): 5 mg via INTRAVENOUS
  Administered 2012-06-11: 10 mg via INTRAVENOUS

## 2012-06-11 MED ORDER — MORPHINE SULFATE 10 MG/ML IJ SOLN
INTRAMUSCULAR | Status: DC | PRN
  Administered 2012-06-11: 10 mg via INTRAVENOUS

## 2012-06-11 MED ORDER — DIPHENHYDRAMINE HCL 12.5 MG/5ML PO ELIX: 12.5000 mg | ORAL_SOLUTION | ORAL | Status: DC | PRN

## 2012-06-11 MED ORDER — OXYCODONE HCL ER 10 MG PO T12A
10.0000 mg | EXTENDED_RELEASE_TABLET | Freq: Two times a day (BID) | ORAL | Status: DC
  Administered 2012-06-11 – 2012-06-13 (×4): 10 mg via ORAL
  Filled 2012-06-11 (×4): qty 1

## 2012-06-11 MED ORDER — NEOSTIGMINE METHYLSULFATE 1 MG/ML IJ SOLN
INTRAMUSCULAR | Status: DC | PRN
  Administered 2012-06-11: 3 mg via INTRAVENOUS

## 2012-06-11 MED ORDER — OXYCODONE HCL 5 MG/5ML PO SOLN: 5.0000 mg | Freq: Once | ORAL | Status: DC | PRN

## 2012-06-11 MED ORDER — ENOXAPARIN SODIUM 30 MG/0.3ML ~~LOC~~ SOLN
30.0000 mg | Freq: Two times a day (BID) | SUBCUTANEOUS | Status: DC
  Administered 2012-06-12 – 2012-06-13 (×3): 30 mg via SUBCUTANEOUS
  Filled 2012-06-11 (×5): qty 0.3

## 2012-06-11 MED ORDER — FENTANYL CITRATE 0.05 MG/ML IJ SOLN
INTRAMUSCULAR | Status: DC | PRN
  Administered 2012-06-11: 50 ug via INTRAVENOUS
  Administered 2012-06-11: 100 ug via INTRAVENOUS
  Administered 2012-06-11 (×2): 50 ug via INTRAVENOUS

## 2012-06-11 MED ORDER — METHOCARBAMOL 500 MG PO TABS
500.0000 mg | ORAL_TABLET | Freq: Four times a day (QID) | ORAL | Status: DC | PRN
  Administered 2012-06-12 – 2012-06-13 (×5): 500 mg via ORAL
  Filled 2012-06-11 (×5): qty 1

## 2012-06-11 MED ORDER — LACTATED RINGERS IV SOLN
INTRAVENOUS | Status: DC | PRN
  Administered 2012-06-11 (×2): via INTRAVENOUS

## 2012-06-11 MED ORDER — CHLORHEXIDINE GLUCONATE 4 % EX LIQD: 60.0000 mL | Freq: Once | CUTANEOUS | Status: DC

## 2012-06-11 MED ORDER — METOCLOPRAMIDE HCL 10 MG PO TABS: 5.0000 mg | ORAL_TABLET | Freq: Three times a day (TID) | ORAL | Status: DC | PRN

## 2012-06-11 MED ORDER — LATANOPROST 0.005 % OP SOLN
1.0000 [drp] | Freq: Every day | OPHTHALMIC | Status: DC
  Administered 2012-06-12 – 2012-06-13 (×2): 1 [drp] via OPHTHALMIC
  Filled 2012-06-11: qty 2.5

## 2012-06-11 MED ORDER — DOCUSATE SODIUM 100 MG PO CAPS
100.0000 mg | ORAL_CAPSULE | Freq: Two times a day (BID) | ORAL | Status: DC
  Administered 2012-06-11 – 2012-06-13 (×2): 100 mg via ORAL
  Filled 2012-06-11 (×5): qty 1

## 2012-06-11 MED ORDER — VANCOMYCIN HCL IN DEXTROSE 1-5 GM/200ML-% IV SOLN
1000.0000 mg | Freq: Two times a day (BID) | INTRAVENOUS | Status: AC
  Administered 2012-06-11: 1000 mg via INTRAVENOUS
  Filled 2012-06-11: qty 200

## 2012-06-11 MED ORDER — HYDROMORPHONE HCL PF 1 MG/ML IJ SOLN: 0.2500 mg | INTRAMUSCULAR | Status: DC | PRN

## 2012-06-11 MED ORDER — ACETAMINOPHEN 10 MG/ML IV SOLN
1000.0000 mg | Freq: Four times a day (QID) | INTRAVENOUS | Status: DC
  Administered 2012-06-11: 1000 mg via INTRAVENOUS
  Filled 2012-06-11 (×3): qty 100

## 2012-06-11 MED ORDER — DEXAMETHASONE SODIUM PHOSPHATE 4 MG/ML IJ SOLN
INTRAMUSCULAR | Status: DC | PRN
  Administered 2012-06-11: 4 mg via INTRAVENOUS

## 2012-06-11 MED ORDER — BUPIVACAINE-EPINEPHRINE 0.25% -1:200000 IJ SOLN
INTRAMUSCULAR | Status: DC | PRN
  Administered 2012-06-11: 20 mL

## 2012-06-11 MED ORDER — BUPIVACAINE 0.25 % ON-Q PUMP SINGLE CATH 300ML
300.0000 mL | INJECTION | Status: DC
  Filled 2012-06-11: qty 300

## 2012-06-11 MED ORDER — SODIUM CHLORIDE 0.9 % IR SOLN
Status: DC | PRN
  Administered 2012-06-11: 3000 mL

## 2012-06-11 MED ORDER — METHOCARBAMOL 100 MG/ML IJ SOLN: 500.0000 mg | Freq: Four times a day (QID) | INTRAVENOUS | Status: DC | PRN

## 2012-06-11 MED ORDER — PANTOPRAZOLE SODIUM 40 MG PO TBEC
40.0000 mg | DELAYED_RELEASE_TABLET | Freq: Every day | ORAL | Status: DC
  Administered 2012-06-12 – 2012-06-13 (×2): 40 mg via ORAL
  Filled 2012-06-11 (×3): qty 1

## 2012-06-11 MED ORDER — LIDOCAINE HCL (CARDIAC) 20 MG/ML IV SOLN
INTRAVENOUS | Status: DC | PRN
  Administered 2012-06-11: 50 mg via INTRAVENOUS

## 2012-06-11 SURGICAL SUPPLY — 56 items
BANDAGE ESMARK 6X9 LF (GAUZE/BANDAGES/DRESSINGS) ×1 IMPLANT
BLADE SAGITTAL 13X1.27X60 (BLADE) ×2 IMPLANT
BLADE SAW SGTL 83.5X18.5 (BLADE) ×2 IMPLANT
BNDG ESMARK 6X9 LF (GAUZE/BANDAGES/DRESSINGS) ×2
BOWL SMART MIX CTS (DISPOSABLE) ×2 IMPLANT
CATH KIT ON Q 5IN SLV (PAIN MANAGEMENT) ×2 IMPLANT
CEMENT BONE SIMPLEX SPEEDSET (Cement) ×4 IMPLANT
CLOTH BEACON ORANGE TIMEOUT ST (SAFETY) ×2 IMPLANT
COVER SURGICAL LIGHT HANDLE (MISCELLANEOUS) ×2 IMPLANT
CUFF TOURNIQUET SINGLE 34IN LL (TOURNIQUET CUFF) ×2 IMPLANT
DRAPE EXTREMITY T 121X128X90 (DRAPE) ×2 IMPLANT
DRAPE INCISE IOBAN 66X45 STRL (DRAPES) ×4 IMPLANT
DRAPE PROXIMA HALF (DRAPES) ×2 IMPLANT
DRAPE U-SHAPE 47X51 STRL (DRAPES) ×2 IMPLANT
DRSG ADAPTIC 3X8 NADH LF (GAUZE/BANDAGES/DRESSINGS) ×2 IMPLANT
DRSG PAD ABDOMINAL 8X10 ST (GAUZE/BANDAGES/DRESSINGS) ×2 IMPLANT
DURAPREP 26ML APPLICATOR (WOUND CARE) ×4 IMPLANT
ELECT REM PT RETURN 9FT ADLT (ELECTROSURGICAL) ×2
ELECTRODE REM PT RTRN 9FT ADLT (ELECTROSURGICAL) ×1 IMPLANT
EVACUATOR 1/8 PVC DRAIN (DRAIN) ×2 IMPLANT
GLOVE BIOGEL M 7.0 STRL (GLOVE) IMPLANT
GLOVE BIOGEL PI IND STRL 7.5 (GLOVE) IMPLANT
GLOVE BIOGEL PI IND STRL 8.5 (GLOVE) ×1 IMPLANT
GLOVE BIOGEL PI INDICATOR 7.5 (GLOVE)
GLOVE BIOGEL PI INDICATOR 8.5 (GLOVE) ×1
GLOVE BIOGEL PI ORTHO PRO SZ8 (GLOVE) ×1
GLOVE PI ORTHO PRO STRL SZ8 (GLOVE) ×1 IMPLANT
GLOVE SURG ORTHO 8.0 STRL STRW (GLOVE) ×4 IMPLANT
GOWN PREVENTION PLUS XLARGE (GOWN DISPOSABLE) ×4 IMPLANT
GOWN STRL NON-REIN LRG LVL3 (GOWN DISPOSABLE) ×4 IMPLANT
HANDPIECE INTERPULSE COAX TIP (DISPOSABLE) ×1
HOOD PEEL AWAY FACE SHEILD DIS (HOOD) ×8 IMPLANT
KIT BASIN OR (CUSTOM PROCEDURE TRAY) ×2 IMPLANT
KIT ROOM TURNOVER OR (KITS) ×2 IMPLANT
MANIFOLD NEPTUNE II (INSTRUMENTS) ×2 IMPLANT
NEEDLE 22X1 1/2 (OR ONLY) (NEEDLE) IMPLANT
NS IRRIG 1000ML POUR BTL (IV SOLUTION) ×2 IMPLANT
PACK TOTAL JOINT (CUSTOM PROCEDURE TRAY) ×2 IMPLANT
PAD ARMBOARD 7.5X6 YLW CONV (MISCELLANEOUS) ×4 IMPLANT
PADDING CAST COTTON 6X4 STRL (CAST SUPPLIES) ×2 IMPLANT
POSITIONER HEAD PRONE TRACH (MISCELLANEOUS) ×2 IMPLANT
SET HNDPC FAN SPRY TIP SCT (DISPOSABLE) ×1 IMPLANT
SPONGE GAUZE 4X4 12PLY (GAUZE/BANDAGES/DRESSINGS) ×2 IMPLANT
STAPLER VISISTAT 35W (STAPLE) ×2 IMPLANT
SUCTION FRAZIER TIP 10 FR DISP (SUCTIONS) ×2 IMPLANT
SUT BONE WAX W31G (SUTURE) ×2 IMPLANT
SUT VIC AB 0 CTB1 27 (SUTURE) ×4 IMPLANT
SUT VIC AB 1 CT1 27 (SUTURE) ×2
SUT VIC AB 1 CT1 27XBRD ANBCTR (SUTURE) ×2 IMPLANT
SUT VIC AB 2-0 CT1 27 (SUTURE) ×4
SUT VIC AB 2-0 CT1 TAPERPNT 27 (SUTURE) ×2 IMPLANT
SYR CONTROL 10ML LL (SYRINGE) IMPLANT
TOWEL OR 17X24 6PK STRL BLUE (TOWEL DISPOSABLE) ×2 IMPLANT
TOWEL OR 17X26 10 PK STRL BLUE (TOWEL DISPOSABLE) ×2 IMPLANT
TRAY FOLEY CATH 14FR (SET/KITS/TRAYS/PACK) ×2 IMPLANT
WATER STERILE IRR 1000ML POUR (IV SOLUTION) ×4 IMPLANT

## 2012-06-11 NOTE — Evaluation (Signed)
Physical Therapy Evaluation Patient Details Name: Priscilla Taylor MRN: [id number] DOB: 1946-03-05 Today's Date: 06/11/2012 Time: 7846-9629 PT Time Calculation (min): 28 min  PT Assessment / Plan / Recommendation Clinical Impression  Pt is a 67 yo female s/p R TKA mobilizing well for first day. c/o light-headedness due to not eating due to sx. anticipate pt to progress well and be safe for d/c home when medically ready. Pt with good home set up and support.    PT Assessment  Patient needs continued PT services    Follow Up Recommendations  Home health PT;Supervision/Assistance - 24 hour    Does the patient have the potential to tolerate intense rehabilitation      Barriers to Discharge None      Equipment Recommendations  Rolling walker with 5" wheels    Recommendations for Other Services     Frequency 7X/week    Precautions / Restrictions Precautions Precautions: Knee Precaution Booklet Issued: Yes (comment) Precaution Comments: went over initial HEP Restrictions Weight Bearing Restrictions: Yes RLE Weight Bearing: Weight bearing as tolerated   Pertinent Vitals/Pain Pt denies pain at this time      Mobility  Bed Mobility Bed Mobility: Supine to Sit Supine to Sit: 4: Min assist;With rails;HOB elevated Details for Bed Mobility Assistance: assist for R LE mangement Transfers Transfers: Editor, commissioning Transfers: 4: Min assist Details for Transfer Assistance: used RW, v/c's for sequencing, no R knee buckling Ambulation/Gait Ambulation/Gait Assistance: Not tested (comment) Ambulation Distance (Feet):  (took 3 steps to chair)    Exercises Total Joint Exercises Ankle Circles/Pumps: AROM;10 reps;Both Quad Sets: AROM;Right;10 reps Heel Slides: AROM;Right;5 reps;Seated   PT Diagnosis: Difficulty walking;Acute pain  PT Problem List: Decreased strength;Decreased range of motion;Decreased activity tolerance;Decreased mobility;Decreased balance PT  Treatment Interventions: DME instruction;Gait training;Stair training;Functional mobility training;Therapeutic activities;Therapeutic exercise   PT Goals Acute Rehab PT Goals PT Goal Formulation: With patient Time For Goal Achievement: 06/18/12 Potential to Achieve Goals: Good Pt will go Supine/Side to Sit: Independently;with HOB 0 degrees PT Goal: Supine/Side to Sit - Progress: Goal set today Pt will go Sit to Supine/Side: with modified independence;with HOB 0 degrees PT Goal: Sit to Supine/Side - Progress: Goal set today Pt will go Sit to Stand: with modified independence;with upper extremity assist (up to RW) PT Goal: Sit to Stand - Progress: Goal set today Pt will Ambulate: >150 feet;with modified independence;with least restrictive assistive device PT Goal: Ambulate - Progress: Goal set today Pt will Go Up / Down Stairs: 1-2 stairs;with supervision;with rolling walker PT Goal: Up/Down Stairs - Progress: Goal set today Pt will Perform Home Exercise Program: Independently PT Goal: Perform Home Exercise Program - Progress: Goal set today  Visit Information  Last PT Received On: 06/11/12 Assistance Needed: +1    Subjective Data  Subjective: Pt received supine in bed with report of no pain and report "I'm starving"   Prior Functioning  Home Living Lives With: Spouse Available Help at Discharge: Family (for the first week) Type of Home: House Home Access: Stairs to enter [employer] of Steps: 1 Entrance Stairs-Rails: None Home Layout: One level Firefighter: Standard Bathroom Accessibility: Yes How Accessible: Accessible via walker Home Adaptive Equipment: Shower chair with back;Walker - four wheeled Prior Function Level of Independence: Independent Able to Take Stairs?: Yes Driving: Yes Communication Communication: No difficulties Dominant Hand: Right    Cognition  Cognition Overall Cognitive Status: Appears within functional limits for tasks  assessed/performed Arousal/Alertness: Awake/alert Orientation Level: Oriented X4 /  Intact Behavior During Session: [facility] for tasks performed    Extremity/Trunk Assessment Right Upper Extremity Assessment RUE ROM/Strength/Tone: Within functional levels RUE Sensation: WFL - Light Touch RUE Coordination: WFL - gross/fine motor Left Upper Extremity Assessment LUE ROM/Strength/Tone: Within functional levels LUE Sensation: WFL - Light Touch LUE Coordination: WFL - gross/fine motor Right Lower Extremity Assessment RLE ROM/Strength/Tone: Deficits RLE ROM/Strength/Tone Deficits: able to initiate quad set, 45 deg active R knee flex RLE Sensation:  (numbness t/o leg) Left Lower Extremity Assessment LLE ROM/Strength/Tone: Within functional levels LLE Sensation: WFL - Light Touch LLE Coordination: WFL - gross/fine motor Trunk Assessment Trunk Assessment: Normal   Balance    End of Session PT - End of Session Equipment Utilized During Treatment: Gait belt Activity Tolerance: Patient tolerated treatment well Patient left: in chair;with call bell/phone within reach;with family/visitor present Nurse Communication: Mobility status   GP     Marcene Brawn 06/11/2012, 5:19 PM  Lewis Shock, PT, DPT Pager #: [phone] Office #: [phone]

## 2012-06-11 NOTE — Anesthesia Procedure Notes (Signed)
Anesthesia Regional Block:  Femoral nerve block  Pre-Anesthetic Checklist: ,, timeout performed, Correct Patient, Correct Site, Correct Laterality, Correct Procedure, Correct Position, site marked, Risks and benefits discussed,  Surgical consent,  Pre-op evaluation,  At surgeon's request and post-op pain management  Laterality: Right  Prep: chloraprep       Needles:   Needle Type: Other     Needle Length: 9cm  Needle Gauge: 21    Additional Needles:  Procedures: ultrasound guided (picture in chart) Femoral nerve block Narrative:  Start time: 06/11/2012 8:57 AM End time: 06/11/2012 9:05 AM Injection made incrementally with aspirations every 5 mL.  Performed by: Personally  Anesthesiologist: C. Adir Schicker MD  Additional Notes: Ultrasound guidance used to: id relevant anatomy, confirm needle position, local anesthetic spread, avoidance of vascular puncture. Picture saved. No complications. Block performed personally by Janetta Hora. Gelene Mink, MD    Femoral nerve block

## 2012-06-11 NOTE — Preoperative (Signed)
Beta Blockers   Reason not to administer Beta Blockers:Not Applicable 

## 2012-06-11 NOTE — H&P (Signed)
  Priscilla Taylor MRN:  [id number] DOB/SEX:  1945/07/20/female  CHIEF COMPLAINT:  Painful right Knee  HISTORY: Patient is a 67 y.o. female presented with a history of pain in the right knee. Onset of symptoms was gradual starting several years ago with gradually worsening course since that time. Prior procedures on the knee include arthroscopy. Patient has been treated conservatively with over-the-counter NSAIDs and activity modification. Patient currently rates pain in the knee at 9 out of 10 with activity. There is pain at night.  PAST MEDICAL HISTORY: There are no active problems to display for this patient.  Past Medical History  Diagnosis Date  . Hyperlipidemia   . Reflux   . Arthritis   . Pneumonia     hx of years ago  . GERD (gastroesophageal reflux disease)   . Eczema   . Frequency of urination     at bedtime  . Chronic kidney disease     x 2   Past Surgical History  Procedure Laterality Date  . Cesarean section    . Joint replacement Left 2004    knee  . Breast biopsy Left 1986  . Appendectomy       MEDICATIONS:   No prescriptions prior to admission    ALLERGIES:   Allergies  Allergen Reactions  . Sulfur Hives  . Amoxicillin Other (See Comments)    vaginitis    REVIEW OF SYSTEMS:  Pertinent items are noted in HPI.   FAMILY HISTORY:  No family history on file.  SOCIAL HISTORY:   History  Substance Use Topics  . Smoking status: Never Smoker   . Smokeless tobacco: Not on file  . Alcohol Use: 0.6 oz/week    1 Glasses of wine per week     Comment: rare     EXAMINATION:  Vital signs in last 24 hours:    General appearance: alert, cooperative and no distress Lungs: clear to auscultation bilaterally Heart: regular rate and rhythm, S1, S2 normal, no murmur, click, rub or gallop Abdomen: soft, non-tender; bowel sounds normal; no masses,  no organomegaly Extremities: extremities normal, atraumatic, no cyanosis or edema and Homans sign is  negative, no sign of DVT Pulses: 2+ and symmetric Skin: Skin color, texture, turgor normal. No rashes or lesions Neurologic: Alert and oriented X 3, normal strength and tone. Normal symmetric reflexes. Normal coordination and gait  Musculoskeletal:  ROM 0-115, Ligaments intact,  Imaging Review Plain radiographs demonstrate severe degenerative joint disease of the right knee. The overall alignment is mild valgus. The bone quality appears to be good for age and reported activity level.  Assessment/Plan: End stage arthritis, right knee   The patient history, physical examination and imaging studies are consistent with advanced degenerative joint disease of the right knee. The patient has failed conservative treatment.  The clearance notes were reviewed.  After discussion with the patient it was felt that Total Knee Replacement was indicated. The procedure,  risks, and benefits of total knee arthroplasty were presented and reviewed. The risks including but not limited to aseptic loosening, infection, blood clots, vascular injury, stiffness, patella tracking problems complications among others were discussed. The patient acknowledged the explanation, agreed to proceed with the plan.  Myrikal Messmer 06/11/2012, 6:44 AM

## 2012-06-11 NOTE — Anesthesia Preprocedure Evaluation (Signed)
Anesthesia Evaluation  Patient identified by MRN, date of birth, ID band Patient awake    Reviewed: Allergy & Precautions, H&P , NPO status , Patient's Chart, lab work & pertinent test results, reviewed documented beta blocker date and time   Airway Mallampati: II TM Distance: >3 FB Neck ROM: full    Dental   Pulmonary neg pulmonary ROS,  breath sounds clear to auscultation        Cardiovascular negative cardio ROS  Rhythm:regular     Neuro/Psych negative neurological ROS  negative psych ROS   GI/Hepatic Neg liver ROS, GERD-  Medicated and Controlled,  Endo/Other  negative endocrine ROS  Renal/GU negative Renal ROS  negative genitourinary   Musculoskeletal   Abdominal   Peds  Hematology negative hematology ROS (+)   Anesthesia Other Findings See surgeon's H&P   Reproductive/Obstetrics negative OB ROS                           Anesthesia Physical Anesthesia Plan  ASA: II  Anesthesia Plan: General   Post-op Pain Management:    Induction: Intravenous  Airway Management Planned: Oral ETT  Additional Equipment:   Intra-op Plan:   Post-operative Plan: Extubation in OR  Informed Consent: I have reviewed the patients History and Physical, chart, labs and discussed the procedure including the risks, benefits and alternatives for the proposed anesthesia with the patient or authorized representative who has indicated his/her understanding and acceptance.   Dental Advisory Given  Plan Discussed with: CRNA and Surgeon  Anesthesia Plan Comments:         Anesthesia Quick Evaluation  

## 2012-06-11 NOTE — Transfer of Care (Signed)
Immediate Anesthesia Transfer of Care Note  Patient: Priscilla Taylor  Procedure(s) Performed: Procedure(s): TOTAL KNEE ARTHROPLASTY (Right)  Patient Location: PACU  Anesthesia Type:GA combined with regional for post-op pain  Level of Consciousness: awake, alert  and oriented  Airway & Oxygen Therapy: Patient Spontanous Breathing and Patient connected to nasal cannula oxygen  Post-op Assessment: Report given to PACU RN  Post vital signs: Reviewed and stable  Complications: No apparent anesthesia complications

## 2012-06-11 NOTE — Anesthesia Postprocedure Evaluation (Signed)
Anesthesia Post Note  Patient: Priscilla Taylor  Procedure(s) Performed: Procedure(s) (LRB): TOTAL KNEE ARTHROPLASTY (Right)  Anesthesia type: General  Patient location: PACU  Post pain: Pain level controlled and Adequate analgesia  Post assessment: Post-op Vital signs reviewed, Patient's Cardiovascular Status Stable, Respiratory Function Stable, Patent Airway and Pain level controlled  Last Vitals:  Filed Vitals:   06/11/12 0910  BP:   Pulse: 95  Temp:   Resp: 16    Post vital signs: Reviewed and stable  Level of consciousness: awake, alert  and oriented  Complications: No apparent anesthesia complications

## 2012-06-11 NOTE — Progress Notes (Signed)
Orthopedic Tech Progress Note Patient Details:  Priscilla Taylor 07/15/1945 [id number]  CPM Right Knee CPM Right Knee: On Right Knee Flexion (Degrees): 90 Right Knee Extension (Degrees): 0 Additional Comments: trapeze bar   Cammer, Mickie Bail 06/11/2012, 2:31 PM

## 2012-06-11 NOTE — Progress Notes (Signed)
UR COMPLETED  

## 2012-06-12 ENCOUNTER — Encounter (HOSPITAL_COMMUNITY): Payer: Self-pay | Admitting: General Practice

## 2012-06-12 LAB — CBC
Hemoglobin: 10.6 g/dL — ABNORMAL LOW (ref 12.0–15.0)
MCH: 29.2 pg (ref 26.0–34.0)
Platelets: 256 10*3/uL (ref 150–400)
RBC: 3.63 MIL/uL — ABNORMAL LOW (ref 3.87–5.11)
WBC: 12.2 10*3/uL — ABNORMAL HIGH (ref 4.0–10.5)

## 2012-06-12 LAB — BASIC METABOLIC PANEL
CO2: 25 mEq/L (ref 19–32)
Calcium: 8.8 mg/dL (ref 8.4–10.5)
Potassium: 4.6 mEq/L (ref 3.5–5.1)
Sodium: 138 mEq/L (ref 135–145)

## 2012-06-12 MED ORDER — PNEUMOCOCCAL VAC POLYVALENT 25 MCG/0.5ML IJ INJ
0.5000 mL | INJECTION | Freq: Once | INTRAMUSCULAR | Status: AC
  Administered 2012-06-13: 0.5 mL via INTRAMUSCULAR
  Filled 2012-06-12 (×2): qty 0.5

## 2012-06-12 MED ORDER — METHOCARBAMOL 500 MG PO TABS: 500.0000 mg | ORAL_TABLET | Freq: Four times a day (QID) | ORAL | Status: DC | PRN

## 2012-06-12 MED ORDER — ENOXAPARIN SODIUM 30 MG/0.3ML ~~LOC~~ SOLN: 30.0000 mg | Freq: Two times a day (BID) | SUBCUTANEOUS | Status: DC

## 2012-06-12 MED ORDER — ENOXAPARIN SODIUM 40 MG/0.4ML ~~LOC~~ SOLN: 40.0000 mg | SUBCUTANEOUS | Status: DC

## 2012-06-12 MED ORDER — OXYCODONE HCL 5 MG PO TABS: 5.0000 mg | ORAL_TABLET | ORAL | Status: DC | PRN

## 2012-06-12 MED ORDER — OXYCODONE HCL ER 10 MG PO T12A: 10.0000 mg | EXTENDED_RELEASE_TABLET | Freq: Two times a day (BID) | ORAL | Status: DC

## 2012-06-12 NOTE — Op Note (Signed)
TOTAL KNEE REPLACEMENT OPERATIVE NOTE:  06/11/2012  7:44 AM  PATIENT:  Priscilla Taylor  67 y.o. female  PRE-OPERATIVE DIAGNOSIS:  osteoarthritis right knee  POST-OPERATIVE DIAGNOSIS:  osteoarthritis right knee  PROCEDURE:  Procedure(s): TOTAL KNEE ARTHROPLASTY  SURGEON:  Surgeon(s): Dannielle Huh, MD  PHYSICIAN ASSISTANT: Altamese Cabal, [facility]  ANESTHESIA:   general  DRAINS: Hemovac and On-Q Marcaine Pain Pump  SPECIMEN: None  COUNTS:  Correct  TOURNIQUET:   Total Tourniquet Time Documented: Thigh (Right) - 49 minutes Total: Thigh (Right) - 49 minutes   DICTATION:  Indication for procedure:    The patient is a 67 y.o. female who has failed conservative treatment for osteoarthritis right knee.  Informed consent was obtained prior to anesthesia. The risks versus benefits of the operation were explain and in a way the patient can, and did, understand.   Description of procedure:     The patient was taken to the operating room and placed under anesthesia.  The patient was positioned in the usual fashion taking care that all body parts were adequately padded and/or protected.  I foley catheter was not placed.  A tourniquet was applied and the leg prepped and draped in the usual sterile fashion.  The extremity was exsanguinated with the esmarch and tourniquet inflated to 350 mmHg.  Pre-operative range of motion was normal.  The knee was in 5 degree of mild varus.  A midline incision approximately 6-7 inches long was made with a #10 blade.  A new blade was used to make a parapatellar arthrotomy going 2-3 cm into the quadriceps tendon, over the patella, and alongside the medial aspect of the patellar tendon.  A synovectomy was then performed with the #10 blade and forceps. I then elevated the deep MCL off the medial tibial metaphysis subperiosteally around to the semimembranosus attachment.    I everted the patella and used calipers to measure patellar thickness.  I used the reamer  to ream down to appropriate thickness to recreate the native thickness.  I then removed excess bone with the rongeur and sagittal saw.  I used the appropriately sized template and drilled the three lug holes.  I then put the trial in place and measured the thickness with the calipers to ensure recreation of the native thickness.  The trial was then removed and the patella subluxed and the knee brought into flexion.  A homan retractor was place to retract and protect the patella and lateral structures.  A Z-retractor was place medially to protect the medial structures.  The extra-medullary alignment system was used to make cut the tibial articular surface perpendicular to the anamotic axis of the tibia and in 3 degrees of posterior slope.  The cut surface and alignment jig was removed.  I then used the intramedullary alignment guide to make a 3 valgus cut on the distal femur.  I then marked out the epicondylar axis on the distal femur.  The posterior condylar axis measured 3 degrees.  I then used the anterior referencing sizer and measured the femur to be a size D.  The 4-In-1 cutting block was screwed into place in external rotation matching the posterior condylar angle, making our cuts perpendicular to the epicondylar axis.  Anterior, posterior and chamfer cuts were made with the sagittal saw.  The cutting block and cut pieces were removed.  A lamina spreader was placed in 90 degrees of flexion.  The ACL, PCL, menisci, and posterior condylar osteophytes were removed.  A 12 mm spacer  blocked was found to offer good flexion and extension gap balance after minimal in degree releasing.   The scoop retractor was then placed and the femoral finishing block was pinned in place.  The small sagittal saw was used as well as the lug drill to finish the femur.  The block and cut surfaces were removed and the medullary canal hole filled with autograft bone from the cut pieces.  The tibia was delivered forward in deep  flexion and external rotation.  A size 3 tray was selected and pinned into place centered on the medial 1/3 of the tibial tubercle.  The reamer and keel was used to prepare the tibia through the tray.    I then trialed with the size D femur, size 3 tibia, a 12 mm insert and the 32 patella.  I had excellent flexion/extension gap balance, excellent patella tracking.  Flexion was full and beyond 120 degrees; extension was zero.  These components were chosen and the staff opened them to me on the back table while the knee was lavaged copiously and the cement mixed.  I cemented in the components and removed all excess cement.  The polyethylene tibial component was snapped into place and the knee placed in extension while cement was hardening.  The capsule was infilltrated with 20cc of .25% Marcaine with epinephrine.  A hemovac was place in the joint exiting superolaterally.  A pain pump was place superomedially superficial to the arthrotomy.  Once the cement was hard, the tourniquet was let down.  Hemostasis was obtained.  The arthrotomy was closed with figure-8 #1 vicryl sutures.  The deep soft tissues were closed with #0 vicryls and the subcuticular layer closed with a running #2-0 vicryl.  The skin was reapproximated and closed with skin staples.  The wound was dressed with xeroform, 4 x4's, 2 ABD sponges, a single layer of webril and a TED stocking.   The patient was then awakened, extubated, and taken to the recovery room in stable condition.  BLOOD LOSS:  300cc DRAINS: 1 hemovac, 1 pain catheter COMPLICATIONS:  None.  PLAN OF CARE: Admit to inpatient   PATIENT DISPOSITION:  PACU - hemodynamically stable.   Delay start of Pharmacological VTE agent (>24hrs) due to surgical blood loss or risk of bleeding:  not applicable  Please fax a copy of this op note to my office at [phone] (please only include page 1 and 2 of the Case Information op note)

## 2012-06-12 NOTE — Care Management Note (Signed)
CARE MANAGEMENT NOTE 06/12/2012  Patient:  Priscilla Taylor, Priscilla Taylor   Account Number:  [id number]  Date Initiated:  06/12/2012  Documentation initiated by:  Vance Peper  Subjective/Objective Assessment:   67 yr old female s/p right total knee arthroplasty.     Action/Plan:   CM spoke with patient concerning home health and DME needs at discharge. patient preoperatively setup with [facility], no changes.Patient has 3in1 and CPM, rolling walker to be delivered to patient's room. Has family support.   Anticipated DC Date:  06/13/2012   Anticipated DC Plan:  HOME W HOME HEALTH SERVICES      DC Planning Services  CM consult      PAC Choice  DURABLE MEDICAL EQUIPMENT  HOME HEALTH   Choice offered to / List presented to:  C-1 Patient   DME arranged  3-N-1      DME agency  [employer]     HH arranged  HH-2 PT      [facility] agency  [facility]   Status of service:  Completed, signed off Medicare Important Message given?   (If response is "NO", the following Medicare IM given date fields will be blank) Date Medicare IM given:   Date Additional Medicare IM given:    Discharge Disposition:  HOME W HOME HEALTH SERVICES  Per UR Regulation:    If discussed at Long Length of Stay Meetings, dates discussed:    Comments:

## 2012-06-12 NOTE — Progress Notes (Signed)
Physical Therapy Treatment Patient Details Name: Priscilla Taylor MRN: [id number] DOB: [birth year] Today's Date: 06/12/2012 Time: 7829-5621 PT Time Calculation (min): 24 min  PT Assessment / Plan / Recommendation Comments on Treatment Session  Patient progressing with overall mobility this session. Complains of feeling very weak. Anticipate discharge tomorrow after stair training. Patients husband in room and very helpful. Continue with current POC    Follow Up Recommendations  Home health PT;Supervision/Assistance - 24 hour     Does the patient have the potential to tolerate intense rehabilitation     Barriers to Discharge        Equipment Recommendations  Rolling walker with 5" wheels    Recommendations for Other Services    Frequency 7X/week   Plan Discharge plan remains appropriate;Frequency remains appropriate    Precautions / Restrictions Precautions Precautions: Knee Restrictions RLE Weight Bearing: Weight bearing as tolerated   Pertinent Vitals/Pain no apparent distress     Mobility  Bed Mobility Bed Mobility: Sit to Supine Sit to Supine: 5: Supervision Transfers Sit to Stand: 4: Min guard;With armrests;From chair/3-in-1 Stand to Sit: 4: Min guard;With armrests;To chair/3-in-1;To bed Details for Transfer Assistance: Cues for safe technique and hand placement Ambulation/Gait Ambulation/Gait Assistance: 4: Min guard Ambulation Distance (Feet): 75 Feet Assistive device: Rolling walker Ambulation/Gait Assistance Details: Cues for posture and to look forward. Cues for RW management Gait Pattern: Step-to pattern;Decreased step length - right;Decreased step length - left;Antalgic Gait velocity: decreased    Exercises Total Joint Exercises Quad Sets: AROM;Right;10 reps Heel Slides: Right;10 reps;AAROM Hip ABduction/ADduction: AAROM;Right;10 reps Straight Leg Raises: AAROM;Right;10 reps   PT Diagnosis:    PT Problem List:   PT Treatment Interventions:     PT  Goals Acute Rehab PT Goals PT Goal: Sit to Supine/Side - Progress: Progressing toward goal PT Goal: Sit to Stand - Progress: Progressing toward goal PT Goal: Ambulate - Progress: Progressing toward goal PT Goal: Perform Home Exercise Program - Progress: Progressing toward goal  Visit Information  Last PT Received On: 06/12/12 Assistance Needed: +1    Subjective Data      Cognition  Cognition Overall Cognitive Status: Appears within functional limits for tasks assessed/performed Arousal/Alertness: Awake/alert Orientation Level: Appears intact for tasks assessed Behavior During Session: [facility] for tasks performed    Balance     End of Session PT - End of Session Equipment Utilized During Treatment: Gait belt Activity Tolerance: Patient tolerated treatment well Patient left: with call bell/phone within reach;with family/visitor present;in bed CPM Right Knee CPM Right Knee: On Right Knee Flexion (Degrees): 82 Right Knee Extension (Degrees): 0   GP     Mirah Nevins, Adline Potter 06/12/2012, 2:42 PM 06/12/2012 Fredrich Birks PTA [phone] pager [phone] office

## 2012-06-12 NOTE — Plan of Care (Signed)
Problem: Consults Goal: Diagnosis- Total Joint Replacement Primary Total Knee Right     

## 2012-06-12 NOTE — Progress Notes (Signed)
Occupational Therapy  Order received, reviewed chart/spoke with pt.  No acute OT needs identified.  Will sign off.   Geraldo Haris, OTR/L [phone] 

## 2012-06-12 NOTE — Progress Notes (Signed)
SPORTS MEDICINE AND JOINT REPLACEMENT  Georgena Spurling, MD   Altamese Cabal, PA-C [address] [place], [place], [place]  [address]                             [phone]   PROGRESS NOTE  Subjective:  negative for Chest Pain  negative for Shortness of Breath  negative for Nausea/Vomiting   negative for Calf Pain  negative for Bowel Movement   Tolerating Diet: yes         Patient reports pain as 5 on 0-10 scale.    Objective: Vital signs in last 24 hours:   Patient Vitals for the past 24 hrs:  BP Temp Temp src Pulse Resp SpO2  06/12/12 0800 - - - - 18 94 %  06/12/12 0631 121/55 mmHg 98.6 F (37 C) Oral 101 18 98 %  06/12/12 0226 138/51 mmHg 98.4 F (36.9 C) Oral 75 18 97 %  06/11/12 2021 121/50 mmHg 98 F (36.7 C) Oral 73 18 98 %  06/11/12 1600 - - - - 16 99 %  06/11/12 1526 133/44 mmHg 98.1 F (36.7 C) Oral 62 16 95 %  06/11/12 1500 145/51 mmHg 98.1 F (36.7 C) - 67 14 95 %  06/11/12 1445 - - - 93 14 94 %  06/11/12 1439 - - - 103 14 94 %  06/11/12 1436 151/48 mmHg - - - - -  06/11/12 1430 - - - 94 15 95 %  06/11/12 1421 128/50 mmHg - - - - -  06/11/12 1415 - - - 68 15 94 %  06/11/12 1407 129/54 mmHg - - - - -  06/11/12 1400 - - - 75 15 91 %  06/11/12 1351 133/47 mmHg - - - - -  06/11/12 1345 - - - 68 14 94 %  06/11/12 1336 140/46 mmHg - - - - -  06/11/12 1330 - - - 62 15 93 %  06/11/12 1321 135/53 mmHg - - - - -  06/11/12 1315 135/53 mmHg - - 65 14 93 %  06/11/12 1310 - - - 64 14 93 %  06/11/12 1306 142/53 mmHg - - - - -  06/11/12 1300 - - - 68 13 93 %  06/11/12 1251 145/55 mmHg - - - - -  06/11/12 1245 - - - 64 15 92 %  06/11/12 1236 154/51 mmHg - - - - -  06/11/12 1230 - - - 64 16 94 %  06/11/12 1220 155/59 mmHg 97.1 F (36.2 C) - 93 12 91 %    @flow {1959:LAST@   Intake/Output from previous day:   03/17 0701 - 03/18 0700 In: 2043.8 [I.V.:1743.8] Out: 3475 [Urine:2950; Drains:475]   Intake/Output this shift:       Intake/Output     03/17 0701 -  03/18 0700 03/18 0701 - 03/19 0700   I.V. 1743.8    IV Piggyback 300    Total Intake 2043.8     Urine 2950    Drains 475    Blood 50    Total Output 3475     Net -1431.3             LABORATORY DATA:  Recent Labs  06/11/12 1704 06/12/12 0555  WBC 16.7* 12.2*  HGB 12.3 10.6*  HCT 35.9* 31.4*  PLT 297 256    Recent Labs  06/11/12 1704 06/12/12 0555  NA  --  138  K  --  4.6  CL  --  103  CO2  --  25  BUN  --  9  CREATININE 0.47* 0.56  GLUCOSE  --  133*  CALCIUM  --  8.8   Lab Results  Component Value Date   INR 0.95 06/05/2012    Examination:  General appearance: alert, cooperative and no distress Extremities: Homans sign is negative, no sign of DVT  Wound Exam: clean, dry, intact   Drainage:  Scant/small amount Serosanguinous exudate  Motor Exam: EHL and FHL Intact  Sensory Exam: Deep Peroneal normal   Assessment:    1 Day Post-Op  Procedure(s) (LRB): TOTAL KNEE ARTHROPLASTY (Right)  ADDITIONAL DIAGNOSIS:  Active Problems:   * No active hospital problems. *  Acute Blood Loss Anemia   Plan: Physical Therapy as ordered Weight Bearing as Tolerated (WBAT)  DVT Prophylaxis:  Lovenox  DISCHARGE PLAN: Home  DISCHARGE NEEDS: HHPT, CPM, Walker and 3-in-1 comode seat         Annely Sliva 06/12/2012, 11:41 AM

## 2012-06-12 NOTE — Progress Notes (Signed)
Physical Therapy Treatment Patient Details Name: Priscilla Taylor MRN: [id number] DOB: 1945-11-01 Today's Date: 06/12/2012 Time: 7829-5621 PT Time Calculation (min): 30 min  PT Assessment / Plan / Recommendation Comments on Treatment Session  Patient presents with R TKA. Progressing with short ambulation this morning. Will attempt long hall ambulation and possibly one step this afternoon.    Follow Up Recommendations  Home health PT;Supervision/Assistance - 24 hour     Does the patient have the potential to tolerate intense rehabilitation     Barriers to Discharge        Equipment Recommendations  Rolling walker with 5" wheels    Recommendations for Other Services    Frequency 7X/week   Plan Discharge plan remains appropriate;Frequency remains appropriate    Precautions / Restrictions Precautions Precautions: Knee Restrictions RLE Weight Bearing: Weight bearing as tolerated   Pertinent Vitals/Pain 8/10 R Knee pain. Patient medicated during session    Mobility  Bed Mobility Supine to Sit: 5: Supervision Transfers Transfers: Stand to Sit;Sit to Stand Sit to Stand: 4: Min assist;With upper extremity assist;From bed;From chair/3-in-1 Stand to Sit: 4: Min assist;With upper extremity assist;To chair/3-in-1 Details for Transfer Assistance: Cues for safe technique and hand placement Ambulation/Gait Ambulation/Gait Assistance: 4: Min guard Ambulation Distance (Feet): 30 Feet Assistive device: Rolling walker Ambulation/Gait Assistance Details: Cues for gait sequence, posture and RW management Gait Pattern: Step-to pattern;Decreased step length - right;Decreased step length - left;Antalgic Gait velocity: decreased    Exercises Total Joint Exercises Ankle Circles/Pumps: AROM;10 reps;Both Quad Sets: AROM;Right;10 reps Heel Slides: Right;10 reps;AAROM Hip ABduction/ADduction: AAROM;Right;10 reps Straight Leg Raises: AAROM;Right;10 reps   PT Diagnosis:    PT Problem List:    PT Treatment Interventions:     PT Goals Acute Rehab PT Goals PT Goal: Supine/Side to Sit - Progress: Progressing toward goal PT Goal: Sit to Stand - Progress: Progressing toward goal PT Goal: Ambulate - Progress: Progressing toward goal PT Goal: Perform Home Exercise Program - Progress: Progressing toward goal  Visit Information  Last PT Received On: 06/12/12 Assistance Needed: +1    Subjective Data      Cognition  Cognition Overall Cognitive Status: Appears within functional limits for tasks assessed/performed Arousal/Alertness: Awake/alert Orientation Level: Appears intact for tasks assessed Behavior During Session: [facility] for tasks performed    Balance     End of Session PT - End of Session Equipment Utilized During Treatment: Gait belt Activity Tolerance: Patient tolerated treatment well Patient left: in chair;with call bell/phone within reach;with family/visitor present Nurse Communication: Mobility status   GP     Fredrich Birks 06/12/2012, 9:14 AM 06/12/2012 Fredrich Birks PTA [phone] pager [phone] office

## 2012-06-13 LAB — CBC
Hemoglobin: 10.1 g/dL — ABNORMAL LOW (ref 12.0–15.0)
MCV: 85.5 fL (ref 78.0–100.0)
Platelets: 221 10*3/uL (ref 150–400)
RBC: 3.46 MIL/uL — ABNORMAL LOW (ref 3.87–5.11)
WBC: 11.9 10*3/uL — ABNORMAL HIGH (ref 4.0–10.5)

## 2012-06-13 LAB — BASIC METABOLIC PANEL
CO2: 25 mEq/L (ref 19–32)
Chloride: 103 mEq/L (ref 96–112)
Creatinine, Ser: 0.6 mg/dL (ref 0.50–1.10)
Glucose, Bld: 146 mg/dL — ABNORMAL HIGH (ref 70–99)
Sodium: 138 mEq/L (ref 135–145)

## 2012-06-13 NOTE — Discharge Summary (Signed)
SPORTS MEDICINE & JOINT REPLACEMENT   Georgena Spurling, MD   Altamese Cabal, PA-C [address] [place], [place], [place]  [address]                             [phone]  PATIENT ID: Priscilla Taylor        MRN:  [id number]          DOB/AGE: June 07, 1945 / 67 y.o.    DISCHARGE SUMMARY  ADMISSION DATE:    06/11/2012 DISCHARGE DATE:   06/13/2012   ADMISSION DIAGNOSIS: osteoarthritis right knee    DISCHARGE DIAGNOSIS:  osteoarthritis right knee    ADDITIONAL DIAGNOSIS: Active Problems:   * No active hospital problems. *  Past Medical History  Diagnosis Date  . Hyperlipidemia   . Reflux   . Arthritis   . Pneumonia     hx of years ago  . GERD (gastroesophageal reflux disease)   . Eczema   . Frequency of urination     at bedtime  . Chronic kidney disease     x 2    PROCEDURE: Procedure(s): TOTAL KNEE ARTHROPLASTY on 06/11/2012  CONSULTS:     HISTORY:  See H&P in chart  HOSPITAL COURSE:  Priscilla Taylor is a 67 y.o. admitted on 06/11/2012 and found to have a diagnosis of osteoarthritis right knee.  After appropriate laboratory studies were obtained  they were taken to the operating room on 06/11/2012 and underwent Procedure(s): TOTAL KNEE ARTHROPLASTY.   They were given perioperative antibiotics:  Anti-infectives   Start     Dose/Rate Route Frequency Ordered Stop   06/11/12 2200  vancomycin (VANCOCIN) IVPB 1000 mg/200 mL premix     1,000 mg 200 mL/hr over 60 Minutes Intravenous Every 12 hours 06/11/12 1545 06/11/12 2234   06/11/12 0600  vancomycin (VANCOCIN) IVPB 1000 mg/200 mL premix     1,000 mg 200 mL/hr over 60 Minutes Intravenous On call to O.R. 06/10/12 1247 06/11/12 1009    .  Tolerated the procedure well.  Placed with a foley intraoperatively.  Given Ofirmev at induction and for 48 hours.    POD# 1: Vital signs were stable.  Patient denied Chest pain, shortness of breath, or calf pain.  Patient was started on Lovenox 30 mg subcutaneously twice daily at 8am.   Consults to PT, OT, and care management were made.  The patient was weight bearing as tolerated.  CPM was placed on the operative leg 0-90 degrees for 6-8 hours a day.  Incentive spirometry was taught.  Dressing was changed.  Marcaine pump and hemovac were discontinued.      POD #2, Continued  PT for ambulation and exercise program.  IV saline locked.  O2 discontinued.    The remainder of the hospital course was dedicated to ambulation and strengthening.   The patient was discharged on 2 Days Post-Op in  Good condition.  Blood products given:none  DIAGNOSTIC STUDIES: Recent vital signs: Patient Vitals for the past 24 hrs:  BP Temp Pulse Resp SpO2  06/13/12 0544 127/53 mmHg 99.5 F (37.5 C) 118 16 92 %  06/13/12 0400 - - - 16 -  06/13/12 0000 - - - 18 -  06/12/12 2059 122/60 mmHg 98.7 F (37.1 C) 101 18 92 %  06/12/12 2000 - - - 16 96 %  06/12/12 1538 - - - 18 -       Recent laboratory studies:  Recent Labs  06/11/12 1704 06/12/12 0555 06/13/12 0455  WBC 16.7* 12.2* 11.9*  HGB 12.3 10.6* 10.1*  HCT 35.9* 31.4* 29.6*  PLT 297 256 221    Recent Labs  06/11/12 1704 06/12/12 0555 06/13/12 0455  NA  --  138 138  K  --  4.6 4.2  CL  --  103 103  CO2  --  25 25  BUN  --  9 11  CREATININE 0.47* 0.56 0.60  GLUCOSE  --  133* 146*  CALCIUM  --  8.8 8.7   Lab Results  Component Value Date   INR 0.95 06/05/2012     Recent Radiographic Studies :  Dg Chest 2 View  06/05/2012  *RADIOLOGY REPORT*  Clinical Data: Preoperative evaluation for knee arthroplasty  CHEST - 2 VIEW  Comparison: None.  Findings:  There is minimal scarring in the left base.  The lungs are otherwise clear.  Heart size and pulmonary vascularity are normal.  No adenopathy.  There is degenerative change in the thoracic spine.  IMPRESSION: Minimal scarring left base.  No edema or consolidation.   Original Report Authenticated By: Bretta Bang, M.D.     DISCHARGE INSTRUCTIONS: Discharge Orders   Future  Orders Complete By Expires     CPM  As directed     Comments:      Continuous passive motion machine (CPM):      Use the CPM from 0 to 90 for 6-8 hours per day.      You may increase by 10 per day.  You may break it up into 2 or 3 sessions per day.      Use CPM for 2 weeks or until you are told to stop.    Call MD / Call 911  As directed     Comments:      If you experience chest pain or shortness of breath, CALL 911 and be transported to the hospital emergency room.  If you develope a fever above 101 F, pus (white drainage) or increased drainage or redness at the wound, or calf pain, call your surgeon's office.    Change dressing  As directed     Comments:      Change dressing on thursday, then change the dressing daily with sterile 4 x 4 inch gauze dressing and apply TED hose.  You may clean the incision with alcohol prior to redressing.    Constipation Prevention  As directed     Comments:      Drink plenty of fluids.  Prune juice may be helpful.  You may use a stool softener, such as Colace (over the counter) 100 mg twice a day.  Use MiraLax (over the counter) for constipation as needed.    Diet - low sodium heart healthy  As directed     Do not put a pillow under the knee. Place it under the heel.  As directed     Driving restrictions  As directed     Comments:      No driving for 6 weeks    Increase activity slowly as tolerated  As directed     Lifting restrictions  As directed     Comments:      No lifting for 6 weeks    TED hose  As directed     Comments:      Use stockings (TED hose) for 3 weeks on both leg(s).  You may remove them at night for sleeping.    Weight bearing  as tolerated  As directed        DISCHARGE MEDICATIONS:     Medication List    STOP taking these medications       Fish Oil 1200 MG Caps     RED YEAST RICE PO      TAKE these medications       citalopram 20 MG tablet  Commonly known as:  CELEXA  Take 20 mg by mouth daily.     enoxaparin 30  MG/0.3ML injection  Commonly known as:  LOVENOX  Inject 0.3 mLs (30 mg total) into the skin every 12 (twelve) hours.     enoxaparin 40 MG/0.4ML injection  Commonly known as:  LOVENOX  Inject 0.4 mLs (40 mg total) into the skin daily.     latanoprost 0.005 % ophthalmic solution  Commonly known as:  XALATAN  Place 1 drop into both eyes daily.     methocarbamol 500 MG tablet  Commonly known as:  ROBAXIN  Take 1 tablet (500 mg total) by mouth every 6 (six) hours as needed.     multivitamin tablet  Take 1 tablet by mouth daily.     niacin 500 MG tablet  Take 500 mg by mouth 4 (four) times daily.     omeprazole 20 MG capsule  Commonly known as:  PRILOSEC  Take 20 mg by mouth daily.     oxyCODONE 5 MG immediate release tablet  Commonly known as:  Oxy IR/ROXICODONE  Take 1-2 tablets (5-10 mg total) by mouth every 3 (three) hours as needed.     OxyCODONE 10 mg T12a  Commonly known as:  OXYCONTIN  Take 1 tablet (10 mg total) by mouth every 12 (twelve) hours.     rosuvastatin 20 MG tablet  Commonly known as:  CRESTOR  Take 20 mg by mouth daily.        FOLLOW UP VISIT:       Follow-up Information   Follow up with Raymon Mutton, MD. Call on 06/26/2012.   Contact information:   [address] [place] [address] [phone]       DISPOSITION: HOME   CONDITION:  Good   Aritza Brunet 06/13/2012, 1:46 PM

## 2012-06-13 NOTE — Progress Notes (Signed)
Discharge instructions completed with patient. Reviewed new medication list, as well as medications that the patient needs to take tonight after being discharged.

## 2012-06-13 NOTE — Progress Notes (Signed)
SPORTS MEDICINE AND JOINT REPLACEMENT  Georgena Spurling, MD   Altamese Cabal, PA-C [address] [place], [place], [place]  [address]                             [phone]   PROGRESS NOTE  Subjective:  negative for Chest Pain  negative for Shortness of Breath  negative for Nausea/Vomiting   negative for Calf Pain  negative for Bowel Movement   Tolerating Diet: yes         Patient reports pain as 4 on 0-10 scale.    Objective: Vital signs in last 24 hours:   Patient Vitals for the past 24 hrs:  BP Temp Pulse Resp SpO2  06/13/12 0544 127/53 mmHg 99.5 F (37.5 C) 118 16 92 %  06/13/12 0400 - - - 16 -  06/13/12 0000 - - - 18 -  06/12/12 2059 122/60 mmHg 98.7 F (37.1 C) 101 18 92 %  06/12/12 2000 - - - 16 96 %  06/12/12 1538 - - - 18 -    @flow {1959:LAST@   Intake/Output from previous day:   03/18 0701 - 03/19 0700 In: 240 [P.O.:240] Out: -    Intake/Output this shift:       Intake/Output     03/18 0701 - 03/19 0700 03/19 0701 - 03/20 0700   P.O. 240    I.V.     IV Piggyback     Total Intake 240     Urine     Drains     Blood     Total Output       Net +240          Urine Occurrence 2 x       LABORATORY DATA:  Recent Labs  06/11/12 1704 06/12/12 0555 06/13/12 0455  WBC 16.7* 12.2* 11.9*  HGB 12.3 10.6* 10.1*  HCT 35.9* 31.4* 29.6*  PLT 297 256 221    Recent Labs  06/11/12 1704 06/12/12 0555 06/13/12 0455  NA  --  138 138  K  --  4.6 4.2  CL  --  103 103  CO2  --  25 25  BUN  --  9 11  CREATININE 0.47* 0.56 0.60  GLUCOSE  --  133* 146*  CALCIUM  --  8.8 8.7   Lab Results  Component Value Date   INR 0.95 06/05/2012    Examination:  General appearance: alert, cooperative and no distress Extremities: Homans sign is negative, no sign of DVT  Wound Exam: clean, dry, intact   Drainage:  None: wound tissue dry  Motor Exam: EHL and FHL Intact  Sensory Exam: Deep Peroneal normal   Assessment:    2 Days Post-Op  Procedure(s)  (LRB): TOTAL KNEE ARTHROPLASTY (Right)  ADDITIONAL DIAGNOSIS:  Active Problems:   * No active hospital problems. *  Acute Blood Loss Anemia   Plan: Physical Therapy as ordered Weight Bearing as Tolerated (WBAT)  DVT Prophylaxis:  Lovenox  DISCHARGE PLAN: Home  DISCHARGE NEEDS: HHPT, CPM, Walker and 3-in-1 comode seat         Priscilla Taylor 06/13/2012, 1:43 PM

## 2012-06-13 NOTE — Progress Notes (Signed)
Physical Therapy Treatment Patient Details Name: Priscilla Taylor MRN: [id number] DOB: 07/24/1945 Today's Date: 06/13/2012 Time: 7829-5621 PT Time Calculation (min): 29 min  PT Assessment / Plan / Recommendation Comments on Treatment Session  Patient able to tolerate stair training and increase independence with mobility.     Follow Up Recommendations  Home health PT;Supervision/Assistance - 24 hour     Does the patient have the potential to tolerate intense rehabilitation     Barriers to Discharge        Equipment Recommendations  Rolling walker with 5" wheels    Recommendations for Other Services    Frequency 7X/week   Plan Discharge plan remains appropriate;Frequency remains appropriate    Precautions / Restrictions Precautions Precautions: Knee Restrictions RLE Weight Bearing: Weight bearing as tolerated   Pertinent Vitals/Pain     Mobility  Bed Mobility Supine to Sit: 6: Modified independent (Device/Increase time) Sit to Supine: 6: Modified independent (Device/Increase time) Transfers Sit to Stand: 6: Modified independent (Device/Increase time) Stand to Sit: 6: Modified independent (Device/Increase time) Ambulation/Gait Ambulation/Gait Assistance: 5: Supervision Ambulation Distance (Feet): 90 Feet Assistive device: Rolling walker Ambulation/Gait Assistance Details: Cues for posture.  Patient with good stabilty and heel strike on R LE Gait Pattern: Step-through pattern;Decreased stride length Stairs: Yes Stairs Assistance: 4: Min guard Stair Management Technique: No rails;Backwards;With walker Number of Stairs: 1    Exercises Total Joint Exercises Quad Sets: AROM;Right;10 reps Short Arc QuadBarbaraann Boys;Right;10 reps Heel Slides: Right;10 reps;AROM Hip ABduction/ADduction: Right;10 reps;AROM Straight Leg Raises: Right;10 reps;AROM   PT Diagnosis:    PT Problem List:   PT Treatment Interventions:     PT Goals Acute Rehab PT Goals PT Goal: Supine/Side to  Sit - Progress: Met PT Goal: Sit to Supine/Side - Progress: Met PT Goal: Sit to Stand - Progress: Met PT Goal: Ambulate - Progress: Progressing toward goal PT Goal: Up/Down Stairs - Progress: Progressing toward goal PT Goal: Perform Home Exercise Program - Progress: Progressing toward goal  Visit Information  Last PT Received On: 06/13/12 Assistance Needed: +1    Subjective Data      Cognition  Cognition Overall Cognitive Status: Appears within functional limits for tasks assessed/performed Arousal/Alertness: Awake/alert Orientation Level: Appears intact for tasks assessed Behavior During Session: [facility] for tasks performed    Balance     End of Session PT - End of Session Equipment Utilized During Treatment: Gait belt Activity Tolerance: Patient tolerated treatment well Patient left: with call bell/phone within reach;with family/visitor present;in bed Nurse Communication: Mobility status   GP     Fredrich Birks 06/13/2012, 11:52 AM  06/13/2012 Fredrich Birks PTA [phone] pager [phone] office

## 2012-06-15 DIAGNOSIS — Z471 Aftercare following joint replacement surgery: Secondary | ICD-10-CM | POA: Diagnosis not present

## 2012-06-15 DIAGNOSIS — Z4800 Encounter for change or removal of nonsurgical wound dressing: Secondary | ICD-10-CM | POA: Diagnosis not present

## 2012-06-15 DIAGNOSIS — IMO0001 Reserved for inherently not codable concepts without codable children: Secondary | ICD-10-CM | POA: Diagnosis not present

## 2012-06-15 DIAGNOSIS — Z96659 Presence of unspecified artificial knee joint: Secondary | ICD-10-CM | POA: Diagnosis not present

## 2012-06-15 DIAGNOSIS — R32 Unspecified urinary incontinence: Secondary | ICD-10-CM | POA: Diagnosis not present

## 2012-06-18 DIAGNOSIS — Z471 Aftercare following joint replacement surgery: Secondary | ICD-10-CM | POA: Diagnosis not present

## 2012-06-18 DIAGNOSIS — R32 Unspecified urinary incontinence: Secondary | ICD-10-CM | POA: Diagnosis not present

## 2012-06-18 DIAGNOSIS — Z4800 Encounter for change or removal of nonsurgical wound dressing: Secondary | ICD-10-CM | POA: Diagnosis not present

## 2012-06-18 DIAGNOSIS — IMO0001 Reserved for inherently not codable concepts without codable children: Secondary | ICD-10-CM | POA: Diagnosis not present

## 2012-06-18 DIAGNOSIS — Z96659 Presence of unspecified artificial knee joint: Secondary | ICD-10-CM | POA: Diagnosis not present

## 2012-06-19 DIAGNOSIS — Z471 Aftercare following joint replacement surgery: Secondary | ICD-10-CM | POA: Diagnosis not present

## 2012-06-19 DIAGNOSIS — IMO0001 Reserved for inherently not codable concepts without codable children: Secondary | ICD-10-CM | POA: Diagnosis not present

## 2012-06-19 DIAGNOSIS — Z96659 Presence of unspecified artificial knee joint: Secondary | ICD-10-CM | POA: Diagnosis not present

## 2012-06-19 DIAGNOSIS — Z4800 Encounter for change or removal of nonsurgical wound dressing: Secondary | ICD-10-CM | POA: Diagnosis not present

## 2012-06-19 DIAGNOSIS — R32 Unspecified urinary incontinence: Secondary | ICD-10-CM | POA: Diagnosis not present

## 2012-06-20 DIAGNOSIS — R32 Unspecified urinary incontinence: Secondary | ICD-10-CM | POA: Diagnosis not present

## 2012-06-20 DIAGNOSIS — Z4800 Encounter for change or removal of nonsurgical wound dressing: Secondary | ICD-10-CM | POA: Diagnosis not present

## 2012-06-20 DIAGNOSIS — IMO0001 Reserved for inherently not codable concepts without codable children: Secondary | ICD-10-CM | POA: Diagnosis not present

## 2012-06-20 DIAGNOSIS — Z471 Aftercare following joint replacement surgery: Secondary | ICD-10-CM | POA: Diagnosis not present

## 2012-06-20 DIAGNOSIS — Z96659 Presence of unspecified artificial knee joint: Secondary | ICD-10-CM | POA: Diagnosis not present

## 2012-06-21 DIAGNOSIS — Z96659 Presence of unspecified artificial knee joint: Secondary | ICD-10-CM | POA: Diagnosis not present

## 2012-06-21 DIAGNOSIS — Z471 Aftercare following joint replacement surgery: Secondary | ICD-10-CM | POA: Diagnosis not present

## 2012-06-21 DIAGNOSIS — R32 Unspecified urinary incontinence: Secondary | ICD-10-CM | POA: Diagnosis not present

## 2012-06-21 DIAGNOSIS — IMO0001 Reserved for inherently not codable concepts without codable children: Secondary | ICD-10-CM | POA: Diagnosis not present

## 2012-06-21 DIAGNOSIS — Z4800 Encounter for change or removal of nonsurgical wound dressing: Secondary | ICD-10-CM | POA: Diagnosis not present

## 2012-06-22 DIAGNOSIS — Z96659 Presence of unspecified artificial knee joint: Secondary | ICD-10-CM | POA: Diagnosis not present

## 2012-06-22 DIAGNOSIS — R32 Unspecified urinary incontinence: Secondary | ICD-10-CM | POA: Diagnosis not present

## 2012-06-22 DIAGNOSIS — IMO0001 Reserved for inherently not codable concepts without codable children: Secondary | ICD-10-CM | POA: Diagnosis not present

## 2012-06-22 DIAGNOSIS — Z471 Aftercare following joint replacement surgery: Secondary | ICD-10-CM | POA: Diagnosis not present

## 2012-06-22 DIAGNOSIS — Z4800 Encounter for change or removal of nonsurgical wound dressing: Secondary | ICD-10-CM | POA: Diagnosis not present

## 2012-06-25 DIAGNOSIS — Z96659 Presence of unspecified artificial knee joint: Secondary | ICD-10-CM | POA: Diagnosis not present

## 2012-06-25 DIAGNOSIS — Z4800 Encounter for change or removal of nonsurgical wound dressing: Secondary | ICD-10-CM | POA: Diagnosis not present

## 2012-06-25 DIAGNOSIS — R32 Unspecified urinary incontinence: Secondary | ICD-10-CM | POA: Diagnosis not present

## 2012-06-25 DIAGNOSIS — Z471 Aftercare following joint replacement surgery: Secondary | ICD-10-CM | POA: Diagnosis not present

## 2012-06-25 DIAGNOSIS — IMO0001 Reserved for inherently not codable concepts without codable children: Secondary | ICD-10-CM | POA: Diagnosis not present

## 2012-06-26 DIAGNOSIS — Z4800 Encounter for change or removal of nonsurgical wound dressing: Secondary | ICD-10-CM | POA: Diagnosis not present

## 2012-06-26 DIAGNOSIS — R32 Unspecified urinary incontinence: Secondary | ICD-10-CM | POA: Diagnosis not present

## 2012-06-26 DIAGNOSIS — Z96659 Presence of unspecified artificial knee joint: Secondary | ICD-10-CM | POA: Diagnosis not present

## 2012-06-26 DIAGNOSIS — IMO0001 Reserved for inherently not codable concepts without codable children: Secondary | ICD-10-CM | POA: Diagnosis not present

## 2012-06-26 DIAGNOSIS — Z471 Aftercare following joint replacement surgery: Secondary | ICD-10-CM | POA: Diagnosis not present

## 2012-06-27 DIAGNOSIS — IMO0001 Reserved for inherently not codable concepts without codable children: Secondary | ICD-10-CM | POA: Diagnosis not present

## 2012-06-27 DIAGNOSIS — Z96659 Presence of unspecified artificial knee joint: Secondary | ICD-10-CM | POA: Diagnosis not present

## 2012-06-27 DIAGNOSIS — R32 Unspecified urinary incontinence: Secondary | ICD-10-CM | POA: Diagnosis not present

## 2012-06-27 DIAGNOSIS — Z4800 Encounter for change or removal of nonsurgical wound dressing: Secondary | ICD-10-CM | POA: Diagnosis not present

## 2012-06-27 DIAGNOSIS — Z471 Aftercare following joint replacement surgery: Secondary | ICD-10-CM | POA: Diagnosis not present

## 2012-06-28 DIAGNOSIS — Z471 Aftercare following joint replacement surgery: Secondary | ICD-10-CM | POA: Diagnosis not present

## 2012-06-28 DIAGNOSIS — Z4800 Encounter for change or removal of nonsurgical wound dressing: Secondary | ICD-10-CM | POA: Diagnosis not present

## 2012-06-28 DIAGNOSIS — Z96659 Presence of unspecified artificial knee joint: Secondary | ICD-10-CM | POA: Diagnosis not present

## 2012-06-28 DIAGNOSIS — R32 Unspecified urinary incontinence: Secondary | ICD-10-CM | POA: Diagnosis not present

## 2012-06-28 DIAGNOSIS — IMO0001 Reserved for inherently not codable concepts without codable children: Secondary | ICD-10-CM | POA: Diagnosis not present

## 2012-06-29 DIAGNOSIS — R32 Unspecified urinary incontinence: Secondary | ICD-10-CM | POA: Diagnosis not present

## 2012-06-29 DIAGNOSIS — Z471 Aftercare following joint replacement surgery: Secondary | ICD-10-CM | POA: Diagnosis not present

## 2012-06-29 DIAGNOSIS — IMO0001 Reserved for inherently not codable concepts without codable children: Secondary | ICD-10-CM | POA: Diagnosis not present

## 2012-06-29 DIAGNOSIS — Z4800 Encounter for change or removal of nonsurgical wound dressing: Secondary | ICD-10-CM | POA: Diagnosis not present

## 2012-06-29 DIAGNOSIS — Z96659 Presence of unspecified artificial knee joint: Secondary | ICD-10-CM | POA: Diagnosis not present

## 2012-07-02 DIAGNOSIS — Z4800 Encounter for change or removal of nonsurgical wound dressing: Secondary | ICD-10-CM | POA: Diagnosis not present

## 2012-07-02 DIAGNOSIS — Z96659 Presence of unspecified artificial knee joint: Secondary | ICD-10-CM | POA: Diagnosis not present

## 2012-07-02 DIAGNOSIS — IMO0001 Reserved for inherently not codable concepts without codable children: Secondary | ICD-10-CM | POA: Diagnosis not present

## 2012-07-02 DIAGNOSIS — Z471 Aftercare following joint replacement surgery: Secondary | ICD-10-CM | POA: Diagnosis not present

## 2012-07-02 DIAGNOSIS — R32 Unspecified urinary incontinence: Secondary | ICD-10-CM | POA: Diagnosis not present

## 2012-07-04 DIAGNOSIS — M1710 Unilateral primary osteoarthritis, unspecified knee: Secondary | ICD-10-CM | POA: Diagnosis not present

## 2012-07-04 DIAGNOSIS — M25569 Pain in unspecified knee: Secondary | ICD-10-CM | POA: Diagnosis not present

## 2012-07-04 DIAGNOSIS — M25669 Stiffness of unspecified knee, not elsewhere classified: Secondary | ICD-10-CM | POA: Diagnosis not present

## 2012-07-04 DIAGNOSIS — R269 Unspecified abnormalities of gait and mobility: Secondary | ICD-10-CM | POA: Diagnosis not present

## 2012-07-05 DIAGNOSIS — M25669 Stiffness of unspecified knee, not elsewhere classified: Secondary | ICD-10-CM | POA: Diagnosis not present

## 2012-07-05 DIAGNOSIS — R269 Unspecified abnormalities of gait and mobility: Secondary | ICD-10-CM | POA: Diagnosis not present

## 2012-07-05 DIAGNOSIS — M25569 Pain in unspecified knee: Secondary | ICD-10-CM | POA: Diagnosis not present

## 2012-07-05 DIAGNOSIS — M1710 Unilateral primary osteoarthritis, unspecified knee: Secondary | ICD-10-CM | POA: Diagnosis not present

## 2012-07-10 DIAGNOSIS — R269 Unspecified abnormalities of gait and mobility: Secondary | ICD-10-CM | POA: Diagnosis not present

## 2012-07-10 DIAGNOSIS — M25669 Stiffness of unspecified knee, not elsewhere classified: Secondary | ICD-10-CM | POA: Diagnosis not present

## 2012-07-10 DIAGNOSIS — M1710 Unilateral primary osteoarthritis, unspecified knee: Secondary | ICD-10-CM | POA: Diagnosis not present

## 2012-07-10 DIAGNOSIS — M25569 Pain in unspecified knee: Secondary | ICD-10-CM | POA: Diagnosis not present

## 2012-07-11 DIAGNOSIS — M1710 Unilateral primary osteoarthritis, unspecified knee: Secondary | ICD-10-CM | POA: Diagnosis not present

## 2012-07-11 DIAGNOSIS — M25669 Stiffness of unspecified knee, not elsewhere classified: Secondary | ICD-10-CM | POA: Diagnosis not present

## 2012-07-11 DIAGNOSIS — M25569 Pain in unspecified knee: Secondary | ICD-10-CM | POA: Diagnosis not present

## 2012-07-11 DIAGNOSIS — R269 Unspecified abnormalities of gait and mobility: Secondary | ICD-10-CM | POA: Diagnosis not present

## 2012-07-16 DIAGNOSIS — M25569 Pain in unspecified knee: Secondary | ICD-10-CM | POA: Diagnosis not present

## 2012-07-16 DIAGNOSIS — M25669 Stiffness of unspecified knee, not elsewhere classified: Secondary | ICD-10-CM | POA: Diagnosis not present

## 2012-07-16 DIAGNOSIS — R269 Unspecified abnormalities of gait and mobility: Secondary | ICD-10-CM | POA: Diagnosis not present

## 2012-07-16 DIAGNOSIS — M1710 Unilateral primary osteoarthritis, unspecified knee: Secondary | ICD-10-CM | POA: Diagnosis not present

## 2012-07-18 DIAGNOSIS — M1710 Unilateral primary osteoarthritis, unspecified knee: Secondary | ICD-10-CM | POA: Diagnosis not present

## 2012-07-18 DIAGNOSIS — M25569 Pain in unspecified knee: Secondary | ICD-10-CM | POA: Diagnosis not present

## 2012-07-18 DIAGNOSIS — R269 Unspecified abnormalities of gait and mobility: Secondary | ICD-10-CM | POA: Diagnosis not present

## 2012-07-18 DIAGNOSIS — M25669 Stiffness of unspecified knee, not elsewhere classified: Secondary | ICD-10-CM | POA: Diagnosis not present

## 2012-07-20 DIAGNOSIS — R269 Unspecified abnormalities of gait and mobility: Secondary | ICD-10-CM | POA: Diagnosis not present

## 2012-07-20 DIAGNOSIS — M1710 Unilateral primary osteoarthritis, unspecified knee: Secondary | ICD-10-CM | POA: Diagnosis not present

## 2012-07-20 DIAGNOSIS — M25669 Stiffness of unspecified knee, not elsewhere classified: Secondary | ICD-10-CM | POA: Diagnosis not present

## 2012-07-20 DIAGNOSIS — M25569 Pain in unspecified knee: Secondary | ICD-10-CM | POA: Diagnosis not present

## 2012-07-23 DIAGNOSIS — M25569 Pain in unspecified knee: Secondary | ICD-10-CM | POA: Diagnosis not present

## 2012-07-23 DIAGNOSIS — M1710 Unilateral primary osteoarthritis, unspecified knee: Secondary | ICD-10-CM | POA: Diagnosis not present

## 2012-07-23 DIAGNOSIS — R269 Unspecified abnormalities of gait and mobility: Secondary | ICD-10-CM | POA: Diagnosis not present

## 2012-07-23 DIAGNOSIS — M25669 Stiffness of unspecified knee, not elsewhere classified: Secondary | ICD-10-CM | POA: Diagnosis not present

## 2012-07-25 DIAGNOSIS — M25569 Pain in unspecified knee: Secondary | ICD-10-CM | POA: Diagnosis not present

## 2012-07-25 DIAGNOSIS — R269 Unspecified abnormalities of gait and mobility: Secondary | ICD-10-CM | POA: Diagnosis not present

## 2012-07-25 DIAGNOSIS — M25669 Stiffness of unspecified knee, not elsewhere classified: Secondary | ICD-10-CM | POA: Diagnosis not present

## 2012-07-26 DIAGNOSIS — R269 Unspecified abnormalities of gait and mobility: Secondary | ICD-10-CM | POA: Diagnosis not present

## 2012-07-26 DIAGNOSIS — M25569 Pain in unspecified knee: Secondary | ICD-10-CM | POA: Diagnosis not present

## 2012-07-26 DIAGNOSIS — M1710 Unilateral primary osteoarthritis, unspecified knee: Secondary | ICD-10-CM | POA: Diagnosis not present

## 2012-07-26 DIAGNOSIS — Z96659 Presence of unspecified artificial knee joint: Secondary | ICD-10-CM | POA: Insufficient documentation

## 2012-07-26 DIAGNOSIS — M25669 Stiffness of unspecified knee, not elsewhere classified: Secondary | ICD-10-CM | POA: Diagnosis not present

## 2012-07-31 DIAGNOSIS — M25569 Pain in unspecified knee: Secondary | ICD-10-CM | POA: Diagnosis not present

## 2012-07-31 DIAGNOSIS — R269 Unspecified abnormalities of gait and mobility: Secondary | ICD-10-CM | POA: Diagnosis not present

## 2012-07-31 DIAGNOSIS — M25669 Stiffness of unspecified knee, not elsewhere classified: Secondary | ICD-10-CM | POA: Diagnosis not present

## 2012-08-03 DIAGNOSIS — M25569 Pain in unspecified knee: Secondary | ICD-10-CM | POA: Diagnosis not present

## 2012-08-03 DIAGNOSIS — M25669 Stiffness of unspecified knee, not elsewhere classified: Secondary | ICD-10-CM | POA: Diagnosis not present

## 2012-08-03 DIAGNOSIS — M1710 Unilateral primary osteoarthritis, unspecified knee: Secondary | ICD-10-CM | POA: Diagnosis not present

## 2012-08-03 DIAGNOSIS — R269 Unspecified abnormalities of gait and mobility: Secondary | ICD-10-CM | POA: Diagnosis not present

## 2012-08-06 DIAGNOSIS — R269 Unspecified abnormalities of gait and mobility: Secondary | ICD-10-CM | POA: Diagnosis not present

## 2012-08-06 DIAGNOSIS — M25669 Stiffness of unspecified knee, not elsewhere classified: Secondary | ICD-10-CM | POA: Diagnosis not present

## 2012-08-06 DIAGNOSIS — M1710 Unilateral primary osteoarthritis, unspecified knee: Secondary | ICD-10-CM | POA: Diagnosis not present

## 2012-08-06 DIAGNOSIS — M25569 Pain in unspecified knee: Secondary | ICD-10-CM | POA: Diagnosis not present

## 2012-08-08 DIAGNOSIS — M25569 Pain in unspecified knee: Secondary | ICD-10-CM | POA: Diagnosis not present

## 2012-08-08 DIAGNOSIS — R269 Unspecified abnormalities of gait and mobility: Secondary | ICD-10-CM | POA: Diagnosis not present

## 2012-08-08 DIAGNOSIS — M1710 Unilateral primary osteoarthritis, unspecified knee: Secondary | ICD-10-CM | POA: Diagnosis not present

## 2012-08-08 DIAGNOSIS — M25669 Stiffness of unspecified knee, not elsewhere classified: Secondary | ICD-10-CM | POA: Diagnosis not present

## 2012-08-09 DIAGNOSIS — R269 Unspecified abnormalities of gait and mobility: Secondary | ICD-10-CM | POA: Diagnosis not present

## 2012-08-09 DIAGNOSIS — M25569 Pain in unspecified knee: Secondary | ICD-10-CM | POA: Diagnosis not present

## 2012-08-09 DIAGNOSIS — M1710 Unilateral primary osteoarthritis, unspecified knee: Secondary | ICD-10-CM | POA: Diagnosis not present

## 2012-08-09 DIAGNOSIS — M25669 Stiffness of unspecified knee, not elsewhere classified: Secondary | ICD-10-CM | POA: Diagnosis not present

## 2012-08-13 DIAGNOSIS — M999 Biomechanical lesion, unspecified: Secondary | ICD-10-CM | POA: Diagnosis not present

## 2012-08-13 DIAGNOSIS — M5137 Other intervertebral disc degeneration, lumbosacral region: Secondary | ICD-10-CM | POA: Diagnosis not present

## 2012-08-14 DIAGNOSIS — M25669 Stiffness of unspecified knee, not elsewhere classified: Secondary | ICD-10-CM | POA: Diagnosis not present

## 2012-08-14 DIAGNOSIS — M25569 Pain in unspecified knee: Secondary | ICD-10-CM | POA: Diagnosis not present

## 2012-08-14 DIAGNOSIS — M1710 Unilateral primary osteoarthritis, unspecified knee: Secondary | ICD-10-CM | POA: Diagnosis not present

## 2012-08-14 DIAGNOSIS — R269 Unspecified abnormalities of gait and mobility: Secondary | ICD-10-CM | POA: Diagnosis not present

## 2012-08-15 DIAGNOSIS — M5137 Other intervertebral disc degeneration, lumbosacral region: Secondary | ICD-10-CM | POA: Diagnosis not present

## 2012-08-15 DIAGNOSIS — M999 Biomechanical lesion, unspecified: Secondary | ICD-10-CM | POA: Diagnosis not present

## 2012-08-16 DIAGNOSIS — R269 Unspecified abnormalities of gait and mobility: Secondary | ICD-10-CM | POA: Diagnosis not present

## 2012-08-16 DIAGNOSIS — M25669 Stiffness of unspecified knee, not elsewhere classified: Secondary | ICD-10-CM | POA: Diagnosis not present

## 2012-08-16 DIAGNOSIS — M25569 Pain in unspecified knee: Secondary | ICD-10-CM | POA: Diagnosis not present

## 2012-08-16 DIAGNOSIS — M1710 Unilateral primary osteoarthritis, unspecified knee: Secondary | ICD-10-CM | POA: Diagnosis not present

## 2012-08-21 DIAGNOSIS — M999 Biomechanical lesion, unspecified: Secondary | ICD-10-CM | POA: Diagnosis not present

## 2012-08-21 DIAGNOSIS — R269 Unspecified abnormalities of gait and mobility: Secondary | ICD-10-CM | POA: Diagnosis not present

## 2012-08-21 DIAGNOSIS — M5137 Other intervertebral disc degeneration, lumbosacral region: Secondary | ICD-10-CM | POA: Diagnosis not present

## 2012-08-21 DIAGNOSIS — M25569 Pain in unspecified knee: Secondary | ICD-10-CM | POA: Diagnosis not present

## 2012-08-21 DIAGNOSIS — M1710 Unilateral primary osteoarthritis, unspecified knee: Secondary | ICD-10-CM | POA: Diagnosis not present

## 2012-08-21 DIAGNOSIS — M25669 Stiffness of unspecified knee, not elsewhere classified: Secondary | ICD-10-CM | POA: Diagnosis not present

## 2012-08-22 DIAGNOSIS — M999 Biomechanical lesion, unspecified: Secondary | ICD-10-CM | POA: Diagnosis not present

## 2012-08-22 DIAGNOSIS — M5137 Other intervertebral disc degeneration, lumbosacral region: Secondary | ICD-10-CM | POA: Diagnosis not present

## 2012-08-23 DIAGNOSIS — R269 Unspecified abnormalities of gait and mobility: Secondary | ICD-10-CM | POA: Diagnosis not present

## 2012-08-23 DIAGNOSIS — M25569 Pain in unspecified knee: Secondary | ICD-10-CM | POA: Diagnosis not present

## 2012-08-23 DIAGNOSIS — M1710 Unilateral primary osteoarthritis, unspecified knee: Secondary | ICD-10-CM | POA: Diagnosis not present

## 2012-08-23 DIAGNOSIS — M25669 Stiffness of unspecified knee, not elsewhere classified: Secondary | ICD-10-CM | POA: Diagnosis not present

## 2012-08-27 DIAGNOSIS — M999 Biomechanical lesion, unspecified: Secondary | ICD-10-CM | POA: Diagnosis not present

## 2012-08-27 DIAGNOSIS — M5137 Other intervertebral disc degeneration, lumbosacral region: Secondary | ICD-10-CM | POA: Diagnosis not present

## 2012-08-29 DIAGNOSIS — R269 Unspecified abnormalities of gait and mobility: Secondary | ICD-10-CM | POA: Diagnosis not present

## 2012-08-29 DIAGNOSIS — M1710 Unilateral primary osteoarthritis, unspecified knee: Secondary | ICD-10-CM | POA: Diagnosis not present

## 2012-08-29 DIAGNOSIS — M999 Biomechanical lesion, unspecified: Secondary | ICD-10-CM | POA: Diagnosis not present

## 2012-08-29 DIAGNOSIS — M25669 Stiffness of unspecified knee, not elsewhere classified: Secondary | ICD-10-CM | POA: Diagnosis not present

## 2012-08-29 DIAGNOSIS — M5137 Other intervertebral disc degeneration, lumbosacral region: Secondary | ICD-10-CM | POA: Diagnosis not present

## 2012-08-29 DIAGNOSIS — M25569 Pain in unspecified knee: Secondary | ICD-10-CM | POA: Diagnosis not present

## 2012-09-03 DIAGNOSIS — M999 Biomechanical lesion, unspecified: Secondary | ICD-10-CM | POA: Diagnosis not present

## 2012-09-03 DIAGNOSIS — M5137 Other intervertebral disc degeneration, lumbosacral region: Secondary | ICD-10-CM | POA: Diagnosis not present

## 2012-09-03 DIAGNOSIS — K219 Gastro-esophageal reflux disease without esophagitis: Secondary | ICD-10-CM | POA: Diagnosis not present

## 2012-09-04 DIAGNOSIS — M999 Biomechanical lesion, unspecified: Secondary | ICD-10-CM | POA: Diagnosis not present

## 2012-09-04 DIAGNOSIS — M5137 Other intervertebral disc degeneration, lumbosacral region: Secondary | ICD-10-CM | POA: Diagnosis not present

## 2012-09-11 DIAGNOSIS — M999 Biomechanical lesion, unspecified: Secondary | ICD-10-CM | POA: Diagnosis not present

## 2012-09-11 DIAGNOSIS — M5137 Other intervertebral disc degeneration, lumbosacral region: Secondary | ICD-10-CM | POA: Diagnosis not present

## 2012-09-12 DIAGNOSIS — M5137 Other intervertebral disc degeneration, lumbosacral region: Secondary | ICD-10-CM | POA: Diagnosis not present

## 2012-09-12 DIAGNOSIS — M999 Biomechanical lesion, unspecified: Secondary | ICD-10-CM | POA: Diagnosis not present

## 2012-09-24 DIAGNOSIS — M5137 Other intervertebral disc degeneration, lumbosacral region: Secondary | ICD-10-CM | POA: Diagnosis not present

## 2012-09-24 DIAGNOSIS — M999 Biomechanical lesion, unspecified: Secondary | ICD-10-CM | POA: Diagnosis not present

## 2012-10-01 DIAGNOSIS — M999 Biomechanical lesion, unspecified: Secondary | ICD-10-CM | POA: Diagnosis not present

## 2012-10-01 DIAGNOSIS — M5137 Other intervertebral disc degeneration, lumbosacral region: Secondary | ICD-10-CM | POA: Diagnosis not present

## 2012-10-08 DIAGNOSIS — M5137 Other intervertebral disc degeneration, lumbosacral region: Secondary | ICD-10-CM | POA: Diagnosis not present

## 2012-10-08 DIAGNOSIS — M999 Biomechanical lesion, unspecified: Secondary | ICD-10-CM | POA: Diagnosis not present

## 2012-10-18 DIAGNOSIS — Z803 Family history of malignant neoplasm of breast: Secondary | ICD-10-CM | POA: Diagnosis not present

## 2012-10-18 DIAGNOSIS — Z1231 Encounter for screening mammogram for malignant neoplasm of breast: Secondary | ICD-10-CM | POA: Diagnosis not present

## 2012-11-13 DIAGNOSIS — Z79899 Other long term (current) drug therapy: Secondary | ICD-10-CM | POA: Diagnosis not present

## 2012-11-13 DIAGNOSIS — E782 Mixed hyperlipidemia: Secondary | ICD-10-CM | POA: Diagnosis not present

## 2012-11-16 DIAGNOSIS — Z0000 Encounter for general adult medical examination without abnormal findings: Secondary | ICD-10-CM | POA: Diagnosis not present

## 2012-11-16 DIAGNOSIS — Z23 Encounter for immunization: Secondary | ICD-10-CM | POA: Diagnosis not present

## 2012-11-16 DIAGNOSIS — K3189 Other diseases of stomach and duodenum: Secondary | ICD-10-CM | POA: Diagnosis not present

## 2012-11-29 DIAGNOSIS — Z96659 Presence of unspecified artificial knee joint: Secondary | ICD-10-CM | POA: Diagnosis not present

## 2012-12-13 DIAGNOSIS — H2510 Age-related nuclear cataract, unspecified eye: Secondary | ICD-10-CM | POA: Diagnosis not present

## 2012-12-14 DIAGNOSIS — K219 Gastro-esophageal reflux disease without esophagitis: Secondary | ICD-10-CM | POA: Diagnosis not present

## 2012-12-14 DIAGNOSIS — R197 Diarrhea, unspecified: Secondary | ICD-10-CM | POA: Diagnosis not present

## 2012-12-14 DIAGNOSIS — R143 Flatulence: Secondary | ICD-10-CM | POA: Diagnosis not present

## 2012-12-14 DIAGNOSIS — R141 Gas pain: Secondary | ICD-10-CM | POA: Diagnosis not present

## 2013-01-10 DIAGNOSIS — H40059 Ocular hypertension, unspecified eye: Secondary | ICD-10-CM | POA: Diagnosis not present

## 2013-01-16 DIAGNOSIS — K319 Disease of stomach and duodenum, unspecified: Secondary | ICD-10-CM | POA: Diagnosis not present

## 2013-01-16 DIAGNOSIS — K219 Gastro-esophageal reflux disease without esophagitis: Secondary | ICD-10-CM | POA: Diagnosis not present

## 2013-01-16 DIAGNOSIS — R197 Diarrhea, unspecified: Secondary | ICD-10-CM | POA: Diagnosis not present

## 2013-01-16 DIAGNOSIS — K2960 Other gastritis without bleeding: Secondary | ICD-10-CM | POA: Diagnosis not present

## 2013-01-16 DIAGNOSIS — D131 Benign neoplasm of stomach: Secondary | ICD-10-CM | POA: Diagnosis not present

## 2013-02-14 DIAGNOSIS — H2510 Age-related nuclear cataract, unspecified eye: Secondary | ICD-10-CM | POA: Diagnosis not present

## 2013-02-28 DIAGNOSIS — M999 Biomechanical lesion, unspecified: Secondary | ICD-10-CM | POA: Diagnosis not present

## 2013-02-28 DIAGNOSIS — M5137 Other intervertebral disc degeneration, lumbosacral region: Secondary | ICD-10-CM | POA: Diagnosis not present

## 2013-03-04 DIAGNOSIS — M999 Biomechanical lesion, unspecified: Secondary | ICD-10-CM | POA: Diagnosis not present

## 2013-03-04 DIAGNOSIS — M5137 Other intervertebral disc degeneration, lumbosacral region: Secondary | ICD-10-CM | POA: Diagnosis not present

## 2013-03-05 DIAGNOSIS — M999 Biomechanical lesion, unspecified: Secondary | ICD-10-CM | POA: Diagnosis not present

## 2013-03-05 DIAGNOSIS — M5137 Other intervertebral disc degeneration, lumbosacral region: Secondary | ICD-10-CM | POA: Diagnosis not present

## 2013-03-06 DIAGNOSIS — M999 Biomechanical lesion, unspecified: Secondary | ICD-10-CM | POA: Diagnosis not present

## 2013-03-06 DIAGNOSIS — M5137 Other intervertebral disc degeneration, lumbosacral region: Secondary | ICD-10-CM | POA: Diagnosis not present

## 2013-03-11 DIAGNOSIS — M5137 Other intervertebral disc degeneration, lumbosacral region: Secondary | ICD-10-CM | POA: Diagnosis not present

## 2013-03-11 DIAGNOSIS — M999 Biomechanical lesion, unspecified: Secondary | ICD-10-CM | POA: Diagnosis not present

## 2013-03-13 DIAGNOSIS — M5137 Other intervertebral disc degeneration, lumbosacral region: Secondary | ICD-10-CM | POA: Diagnosis not present

## 2013-03-13 DIAGNOSIS — M999 Biomechanical lesion, unspecified: Secondary | ICD-10-CM | POA: Diagnosis not present

## 2013-04-03 DIAGNOSIS — M5137 Other intervertebral disc degeneration, lumbosacral region: Secondary | ICD-10-CM | POA: Diagnosis not present

## 2013-04-03 DIAGNOSIS — M999 Biomechanical lesion, unspecified: Secondary | ICD-10-CM | POA: Diagnosis not present

## 2013-04-11 DIAGNOSIS — H2510 Age-related nuclear cataract, unspecified eye: Secondary | ICD-10-CM | POA: Diagnosis not present

## 2013-04-17 DIAGNOSIS — M5137 Other intervertebral disc degeneration, lumbosacral region: Secondary | ICD-10-CM | POA: Diagnosis not present

## 2013-04-17 DIAGNOSIS — M999 Biomechanical lesion, unspecified: Secondary | ICD-10-CM | POA: Diagnosis not present

## 2013-04-30 DIAGNOSIS — M999 Biomechanical lesion, unspecified: Secondary | ICD-10-CM | POA: Diagnosis not present

## 2013-04-30 DIAGNOSIS — M5137 Other intervertebral disc degeneration, lumbosacral region: Secondary | ICD-10-CM | POA: Diagnosis not present

## 2013-05-30 DIAGNOSIS — Z471 Aftercare following joint replacement surgery: Secondary | ICD-10-CM | POA: Diagnosis not present

## 2013-05-30 DIAGNOSIS — Z96659 Presence of unspecified artificial knee joint: Secondary | ICD-10-CM | POA: Diagnosis not present

## 2013-07-04 DIAGNOSIS — H2510 Age-related nuclear cataract, unspecified eye: Secondary | ICD-10-CM | POA: Diagnosis not present

## 2013-07-19 DIAGNOSIS — Z01419 Encounter for gynecological examination (general) (routine) without abnormal findings: Secondary | ICD-10-CM | POA: Diagnosis not present

## 2013-07-19 DIAGNOSIS — B977 Papillomavirus as the cause of diseases classified elsewhere: Secondary | ICD-10-CM | POA: Diagnosis not present

## 2013-07-19 DIAGNOSIS — R87610 Atypical squamous cells of undetermined significance on cytologic smear of cervix (ASC-US): Secondary | ICD-10-CM | POA: Diagnosis not present

## 2013-07-19 DIAGNOSIS — Z9189 Other specified personal risk factors, not elsewhere classified: Secondary | ICD-10-CM | POA: Diagnosis not present

## 2013-07-26 DIAGNOSIS — M949 Disorder of cartilage, unspecified: Secondary | ICD-10-CM | POA: Diagnosis not present

## 2013-07-26 DIAGNOSIS — M899 Disorder of bone, unspecified: Secondary | ICD-10-CM | POA: Diagnosis not present

## 2013-08-22 DIAGNOSIS — H40059 Ocular hypertension, unspecified eye: Secondary | ICD-10-CM | POA: Diagnosis not present

## 2013-10-04 DIAGNOSIS — H40059 Ocular hypertension, unspecified eye: Secondary | ICD-10-CM | POA: Diagnosis not present

## 2013-10-24 DIAGNOSIS — Z1231 Encounter for screening mammogram for malignant neoplasm of breast: Secondary | ICD-10-CM | POA: Diagnosis not present

## 2013-10-31 DIAGNOSIS — R928 Other abnormal and inconclusive findings on diagnostic imaging of breast: Secondary | ICD-10-CM | POA: Diagnosis not present

## 2013-10-31 DIAGNOSIS — N63 Unspecified lump in unspecified breast: Secondary | ICD-10-CM | POA: Diagnosis not present

## 2013-11-13 DIAGNOSIS — N63 Unspecified lump in unspecified breast: Secondary | ICD-10-CM | POA: Diagnosis not present

## 2013-11-13 DIAGNOSIS — D249 Benign neoplasm of unspecified breast: Secondary | ICD-10-CM | POA: Diagnosis not present

## 2013-11-13 DIAGNOSIS — N6039 Fibrosclerosis of unspecified breast: Secondary | ICD-10-CM | POA: Diagnosis not present

## 2013-11-21 DIAGNOSIS — R454 Irritability and anger: Secondary | ICD-10-CM | POA: Diagnosis not present

## 2013-11-21 DIAGNOSIS — K219 Gastro-esophageal reflux disease without esophagitis: Secondary | ICD-10-CM | POA: Diagnosis not present

## 2013-11-21 DIAGNOSIS — Z23 Encounter for immunization: Secondary | ICD-10-CM | POA: Diagnosis not present

## 2013-11-21 DIAGNOSIS — E782 Mixed hyperlipidemia: Secondary | ICD-10-CM | POA: Diagnosis not present

## 2013-11-21 DIAGNOSIS — Z0000 Encounter for general adult medical examination without abnormal findings: Secondary | ICD-10-CM | POA: Diagnosis not present

## 2013-11-22 DIAGNOSIS — E782 Mixed hyperlipidemia: Secondary | ICD-10-CM | POA: Diagnosis not present

## 2013-11-22 DIAGNOSIS — R454 Irritability and anger: Secondary | ICD-10-CM | POA: Diagnosis not present

## 2013-11-22 DIAGNOSIS — K219 Gastro-esophageal reflux disease without esophagitis: Secondary | ICD-10-CM | POA: Diagnosis not present

## 2013-11-22 DIAGNOSIS — Z0000 Encounter for general adult medical examination without abnormal findings: Secondary | ICD-10-CM | POA: Diagnosis not present

## 2013-11-22 DIAGNOSIS — Z79899 Other long term (current) drug therapy: Secondary | ICD-10-CM | POA: Diagnosis not present

## 2013-12-05 DIAGNOSIS — D1801 Hemangioma of skin and subcutaneous tissue: Secondary | ICD-10-CM | POA: Diagnosis not present

## 2013-12-05 DIAGNOSIS — L218 Other seborrheic dermatitis: Secondary | ICD-10-CM | POA: Diagnosis not present

## 2013-12-05 DIAGNOSIS — D239 Other benign neoplasm of skin, unspecified: Secondary | ICD-10-CM | POA: Diagnosis not present

## 2013-12-05 DIAGNOSIS — L821 Other seborrheic keratosis: Secondary | ICD-10-CM | POA: Diagnosis not present

## 2013-12-12 DIAGNOSIS — M999 Biomechanical lesion, unspecified: Secondary | ICD-10-CM | POA: Diagnosis not present

## 2013-12-12 DIAGNOSIS — IMO0001 Reserved for inherently not codable concepts without codable children: Secondary | ICD-10-CM | POA: Diagnosis not present

## 2013-12-12 DIAGNOSIS — M545 Low back pain, unspecified: Secondary | ICD-10-CM | POA: Diagnosis not present

## 2013-12-12 DIAGNOSIS — M5137 Other intervertebral disc degeneration, lumbosacral region: Secondary | ICD-10-CM | POA: Diagnosis not present

## 2013-12-25 DIAGNOSIS — M5137 Other intervertebral disc degeneration, lumbosacral region: Secondary | ICD-10-CM | POA: Diagnosis not present

## 2013-12-25 DIAGNOSIS — IMO0001 Reserved for inherently not codable concepts without codable children: Secondary | ICD-10-CM | POA: Diagnosis not present

## 2013-12-25 DIAGNOSIS — M545 Low back pain, unspecified: Secondary | ICD-10-CM | POA: Diagnosis not present

## 2013-12-25 DIAGNOSIS — M999 Biomechanical lesion, unspecified: Secondary | ICD-10-CM | POA: Diagnosis not present

## 2014-01-07 DIAGNOSIS — J329 Chronic sinusitis, unspecified: Secondary | ICD-10-CM | POA: Diagnosis not present

## 2014-01-15 DIAGNOSIS — M9903 Segmental and somatic dysfunction of lumbar region: Secondary | ICD-10-CM | POA: Diagnosis not present

## 2014-01-15 DIAGNOSIS — M545 Low back pain: Secondary | ICD-10-CM | POA: Diagnosis not present

## 2014-01-31 DIAGNOSIS — H40053 Ocular hypertension, bilateral: Secondary | ICD-10-CM | POA: Diagnosis not present

## 2014-02-04 DIAGNOSIS — M5136 Other intervertebral disc degeneration, lumbar region: Secondary | ICD-10-CM | POA: Diagnosis not present

## 2014-02-04 DIAGNOSIS — M9903 Segmental and somatic dysfunction of lumbar region: Secondary | ICD-10-CM | POA: Diagnosis not present

## 2014-02-18 DIAGNOSIS — M5136 Other intervertebral disc degeneration, lumbar region: Secondary | ICD-10-CM | POA: Diagnosis not present

## 2014-02-18 DIAGNOSIS — M9903 Segmental and somatic dysfunction of lumbar region: Secondary | ICD-10-CM | POA: Diagnosis not present

## 2014-02-24 DIAGNOSIS — M9903 Segmental and somatic dysfunction of lumbar region: Secondary | ICD-10-CM | POA: Diagnosis not present

## 2014-02-24 DIAGNOSIS — M5136 Other intervertebral disc degeneration, lumbar region: Secondary | ICD-10-CM | POA: Diagnosis not present

## 2014-02-26 DIAGNOSIS — M5136 Other intervertebral disc degeneration, lumbar region: Secondary | ICD-10-CM | POA: Diagnosis not present

## 2014-02-26 DIAGNOSIS — M9903 Segmental and somatic dysfunction of lumbar region: Secondary | ICD-10-CM | POA: Diagnosis not present

## 2014-02-27 DIAGNOSIS — R1013 Epigastric pain: Secondary | ICD-10-CM | POA: Diagnosis not present

## 2014-02-27 DIAGNOSIS — R1011 Right upper quadrant pain: Secondary | ICD-10-CM | POA: Diagnosis not present

## 2014-03-06 DIAGNOSIS — M9903 Segmental and somatic dysfunction of lumbar region: Secondary | ICD-10-CM | POA: Diagnosis not present

## 2014-03-06 DIAGNOSIS — M5136 Other intervertebral disc degeneration, lumbar region: Secondary | ICD-10-CM | POA: Diagnosis not present

## 2014-03-06 DIAGNOSIS — R1011 Right upper quadrant pain: Secondary | ICD-10-CM | POA: Diagnosis not present

## 2014-05-01 DIAGNOSIS — M545 Low back pain: Secondary | ICD-10-CM | POA: Diagnosis not present

## 2014-06-05 DIAGNOSIS — I83813 Varicose veins of bilateral lower extremities with pain: Secondary | ICD-10-CM | POA: Diagnosis not present

## 2014-06-12 DIAGNOSIS — I83811 Varicose veins of right lower extremities with pain: Secondary | ICD-10-CM | POA: Diagnosis not present

## 2014-06-12 DIAGNOSIS — I87323 Chronic venous hypertension (idiopathic) with inflammation of bilateral lower extremity: Secondary | ICD-10-CM | POA: Diagnosis not present

## 2014-07-10 DIAGNOSIS — R3915 Urgency of urination: Secondary | ICD-10-CM | POA: Diagnosis not present

## 2014-07-24 DIAGNOSIS — Z01419 Encounter for gynecological examination (general) (routine) without abnormal findings: Secondary | ICD-10-CM | POA: Diagnosis not present

## 2014-07-24 DIAGNOSIS — R87610 Atypical squamous cells of undetermined significance on cytologic smear of cervix (ASC-US): Secondary | ICD-10-CM | POA: Diagnosis not present

## 2014-07-24 DIAGNOSIS — Z124 Encounter for screening for malignant neoplasm of cervix: Secondary | ICD-10-CM | POA: Diagnosis not present

## 2014-08-07 DIAGNOSIS — H40053 Ocular hypertension, bilateral: Secondary | ICD-10-CM | POA: Diagnosis not present

## 2014-08-21 DIAGNOSIS — I878 Other specified disorders of veins: Secondary | ICD-10-CM | POA: Diagnosis not present

## 2014-08-21 DIAGNOSIS — R319 Hematuria, unspecified: Secondary | ICD-10-CM | POA: Diagnosis not present

## 2014-08-21 DIAGNOSIS — N8320 Unspecified ovarian cysts: Secondary | ICD-10-CM | POA: Diagnosis not present

## 2014-08-21 DIAGNOSIS — R109 Unspecified abdominal pain: Secondary | ICD-10-CM | POA: Diagnosis not present

## 2014-08-21 DIAGNOSIS — M4800 Spinal stenosis, site unspecified: Secondary | ICD-10-CM | POA: Diagnosis not present

## 2014-08-21 DIAGNOSIS — M129 Arthropathy, unspecified: Secondary | ICD-10-CM | POA: Diagnosis not present

## 2014-08-21 DIAGNOSIS — Z9089 Acquired absence of other organs: Secondary | ICD-10-CM | POA: Diagnosis not present

## 2014-10-06 DIAGNOSIS — H4011X1 Primary open-angle glaucoma, mild stage: Secondary | ICD-10-CM | POA: Diagnosis not present

## 2014-10-16 DIAGNOSIS — N8320 Unspecified ovarian cysts: Secondary | ICD-10-CM | POA: Diagnosis not present

## 2014-10-23 DIAGNOSIS — S40862A Insect bite (nonvenomous) of left upper arm, initial encounter: Secondary | ICD-10-CM | POA: Diagnosis not present

## 2014-11-20 DIAGNOSIS — H34831 Tributary (branch) retinal vein occlusion, right eye: Secondary | ICD-10-CM | POA: Diagnosis not present

## 2014-11-27 DIAGNOSIS — E782 Mixed hyperlipidemia: Secondary | ICD-10-CM | POA: Diagnosis not present

## 2014-11-27 DIAGNOSIS — I999 Unspecified disorder of circulatory system: Secondary | ICD-10-CM | POA: Diagnosis not present

## 2014-11-27 DIAGNOSIS — Z0000 Encounter for general adult medical examination without abnormal findings: Secondary | ICD-10-CM | POA: Diagnosis not present

## 2014-11-27 DIAGNOSIS — M1990 Unspecified osteoarthritis, unspecified site: Secondary | ICD-10-CM | POA: Diagnosis not present

## 2014-11-27 DIAGNOSIS — L309 Dermatitis, unspecified: Secondary | ICD-10-CM | POA: Diagnosis not present

## 2014-11-27 DIAGNOSIS — Z79899 Other long term (current) drug therapy: Secondary | ICD-10-CM | POA: Diagnosis not present

## 2014-11-28 ENCOUNTER — Other Ambulatory Visit: Payer: Self-pay | Admitting: Family Medicine

## 2014-11-28 DIAGNOSIS — R0989 Other specified symptoms and signs involving the circulatory and respiratory systems: Secondary | ICD-10-CM

## 2014-12-05 ENCOUNTER — Ambulatory Visit
Admission: RE | Admit: 2014-12-05 | Discharge: 2014-12-05 | Disposition: A | Discharge status: Home / Self Care | Payer: Medicare Other | Source: Ambulatory Visit | Attending: Family Medicine | Admitting: Family Medicine

## 2014-12-05 DIAGNOSIS — I6523 Occlusion and stenosis of bilateral carotid arteries: Secondary | ICD-10-CM | POA: Diagnosis not present

## 2014-12-05 DIAGNOSIS — R0989 Other specified symptoms and signs involving the circulatory and respiratory systems: Secondary | ICD-10-CM

## 2014-12-10 DIAGNOSIS — Z09 Encounter for follow-up examination after completed treatment for conditions other than malignant neoplasm: Secondary | ICD-10-CM | POA: Diagnosis not present

## 2014-12-19 DIAGNOSIS — H4011X1 Primary open-angle glaucoma, mild stage: Secondary | ICD-10-CM | POA: Diagnosis not present

## 2014-12-19 DIAGNOSIS — H2511 Age-related nuclear cataract, right eye: Secondary | ICD-10-CM | POA: Diagnosis not present

## 2014-12-19 DIAGNOSIS — H34831 Tributary (branch) retinal vein occlusion, right eye: Secondary | ICD-10-CM | POA: Diagnosis not present

## 2014-12-19 DIAGNOSIS — H2512 Age-related nuclear cataract, left eye: Secondary | ICD-10-CM | POA: Diagnosis not present

## 2015-01-05 DIAGNOSIS — H2511 Age-related nuclear cataract, right eye: Secondary | ICD-10-CM | POA: Diagnosis not present

## 2015-01-30 DIAGNOSIS — H348392 Tributary (branch) retinal vein occlusion, unspecified eye, stable: Secondary | ICD-10-CM | POA: Diagnosis not present

## 2015-04-30 DIAGNOSIS — H2512 Age-related nuclear cataract, left eye: Secondary | ICD-10-CM | POA: Diagnosis not present

## 2015-04-30 DIAGNOSIS — H25812 Combined forms of age-related cataract, left eye: Secondary | ICD-10-CM | POA: Diagnosis not present

## 2015-05-21 DIAGNOSIS — I83891 Varicose veins of right lower extremities with other complications: Secondary | ICD-10-CM | POA: Diagnosis not present

## 2015-05-21 DIAGNOSIS — I83811 Varicose veins of right lower extremities with pain: Secondary | ICD-10-CM | POA: Diagnosis not present

## 2015-06-04 DIAGNOSIS — I8311 Varicose veins of right lower extremity with inflammation: Secondary | ICD-10-CM | POA: Diagnosis not present

## 2015-06-04 DIAGNOSIS — I83811 Varicose veins of right lower extremities with pain: Secondary | ICD-10-CM | POA: Diagnosis not present

## 2015-06-08 DIAGNOSIS — I8311 Varicose veins of right lower extremity with inflammation: Secondary | ICD-10-CM | POA: Diagnosis not present

## 2015-06-11 DIAGNOSIS — I8311 Varicose veins of right lower extremity with inflammation: Secondary | ICD-10-CM | POA: Diagnosis not present

## 2015-06-11 DIAGNOSIS — H40053 Ocular hypertension, bilateral: Secondary | ICD-10-CM | POA: Diagnosis not present

## 2015-06-15 DIAGNOSIS — K219 Gastro-esophageal reflux disease without esophagitis: Secondary | ICD-10-CM | POA: Diagnosis not present

## 2015-06-15 DIAGNOSIS — R002 Palpitations: Secondary | ICD-10-CM | POA: Diagnosis not present

## 2015-06-15 DIAGNOSIS — R55 Syncope and collapse: Secondary | ICD-10-CM | POA: Diagnosis not present

## 2015-06-25 DIAGNOSIS — I8311 Varicose veins of right lower extremity with inflammation: Secondary | ICD-10-CM | POA: Diagnosis not present

## 2015-06-25 DIAGNOSIS — I83891 Varicose veins of right lower extremities with other complications: Secondary | ICD-10-CM | POA: Diagnosis not present

## 2015-07-02 DIAGNOSIS — E782 Mixed hyperlipidemia: Secondary | ICD-10-CM | POA: Diagnosis not present

## 2015-07-02 DIAGNOSIS — R002 Palpitations: Secondary | ICD-10-CM | POA: Diagnosis not present

## 2015-07-09 DIAGNOSIS — I8311 Varicose veins of right lower extremity with inflammation: Secondary | ICD-10-CM | POA: Diagnosis not present

## 2015-08-04 DIAGNOSIS — L03115 Cellulitis of right lower limb: Secondary | ICD-10-CM | POA: Diagnosis not present

## 2015-08-04 DIAGNOSIS — M25531 Pain in right wrist: Secondary | ICD-10-CM | POA: Diagnosis not present

## 2015-08-06 DIAGNOSIS — M8580 Other specified disorders of bone density and structure, unspecified site: Secondary | ICD-10-CM | POA: Diagnosis not present

## 2015-08-06 DIAGNOSIS — M8589 Other specified disorders of bone density and structure, multiple sites: Secondary | ICD-10-CM | POA: Diagnosis not present

## 2015-09-03 DIAGNOSIS — I83811 Varicose veins of right lower extremities with pain: Secondary | ICD-10-CM | POA: Diagnosis not present

## 2015-09-11 DIAGNOSIS — H401131 Primary open-angle glaucoma, bilateral, mild stage: Secondary | ICD-10-CM | POA: Diagnosis not present

## 2015-09-18 DIAGNOSIS — A630 Anogenital (venereal) warts: Secondary | ICD-10-CM | POA: Diagnosis not present

## 2015-09-18 DIAGNOSIS — Z01419 Encounter for gynecological examination (general) (routine) without abnormal findings: Secondary | ICD-10-CM | POA: Diagnosis not present

## 2015-09-18 DIAGNOSIS — Z779 Other contact with and (suspected) exposures hazardous to health: Secondary | ICD-10-CM | POA: Diagnosis not present

## 2015-11-26 DIAGNOSIS — K219 Gastro-esophageal reflux disease without esophagitis: Secondary | ICD-10-CM | POA: Diagnosis not present

## 2015-11-26 DIAGNOSIS — M25531 Pain in right wrist: Secondary | ICD-10-CM | POA: Diagnosis not present

## 2015-12-11 DIAGNOSIS — Z1231 Encounter for screening mammogram for malignant neoplasm of breast: Secondary | ICD-10-CM | POA: Diagnosis not present

## 2015-12-11 DIAGNOSIS — H401131 Primary open-angle glaucoma, bilateral, mild stage: Secondary | ICD-10-CM | POA: Diagnosis not present

## 2015-12-11 DIAGNOSIS — Z803 Family history of malignant neoplasm of breast: Secondary | ICD-10-CM | POA: Diagnosis not present

## 2015-12-17 DIAGNOSIS — N6001 Solitary cyst of right breast: Secondary | ICD-10-CM | POA: Diagnosis not present

## 2015-12-17 DIAGNOSIS — Z79899 Other long term (current) drug therapy: Secondary | ICD-10-CM | POA: Diagnosis not present

## 2015-12-17 DIAGNOSIS — E782 Mixed hyperlipidemia: Secondary | ICD-10-CM | POA: Diagnosis not present

## 2015-12-24 DIAGNOSIS — M1990 Unspecified osteoarthritis, unspecified site: Secondary | ICD-10-CM | POA: Diagnosis not present

## 2015-12-24 DIAGNOSIS — F418 Other specified anxiety disorders: Secondary | ICD-10-CM | POA: Diagnosis not present

## 2015-12-24 DIAGNOSIS — E782 Mixed hyperlipidemia: Secondary | ICD-10-CM | POA: Diagnosis not present

## 2015-12-24 DIAGNOSIS — K219 Gastro-esophageal reflux disease without esophagitis: Secondary | ICD-10-CM | POA: Diagnosis not present

## 2015-12-24 DIAGNOSIS — Z0000 Encounter for general adult medical examination without abnormal findings: Secondary | ICD-10-CM | POA: Diagnosis not present

## 2015-12-25 DIAGNOSIS — M654 Radial styloid tenosynovitis [de Quervain]: Secondary | ICD-10-CM | POA: Diagnosis not present

## 2016-01-22 DIAGNOSIS — M654 Radial styloid tenosynovitis [de Quervain]: Secondary | ICD-10-CM | POA: Diagnosis not present

## 2016-02-25 DIAGNOSIS — I83891 Varicose veins of right lower extremities with other complications: Secondary | ICD-10-CM | POA: Diagnosis not present

## 2016-02-25 DIAGNOSIS — I83811 Varicose veins of right lower extremities with pain: Secondary | ICD-10-CM | POA: Diagnosis not present

## 2016-03-03 DIAGNOSIS — H401131 Primary open-angle glaucoma, bilateral, mild stage: Secondary | ICD-10-CM | POA: Diagnosis not present

## 2016-05-12 DIAGNOSIS — J321 Chronic frontal sinusitis: Secondary | ICD-10-CM | POA: Diagnosis not present

## 2016-05-26 DIAGNOSIS — H40053 Ocular hypertension, bilateral: Secondary | ICD-10-CM | POA: Diagnosis not present

## 2016-06-23 DIAGNOSIS — N6001 Solitary cyst of right breast: Secondary | ICD-10-CM | POA: Diagnosis not present

## 2016-06-23 DIAGNOSIS — N6314 Unspecified lump in the right breast, lower inner quadrant: Secondary | ICD-10-CM | POA: Diagnosis not present

## 2016-08-30 DIAGNOSIS — H401131 Primary open-angle glaucoma, bilateral, mild stage: Secondary | ICD-10-CM | POA: Diagnosis not present

## 2016-10-11 DIAGNOSIS — M47812 Spondylosis without myelopathy or radiculopathy, cervical region: Secondary | ICD-10-CM | POA: Diagnosis not present

## 2016-10-11 DIAGNOSIS — M9903 Segmental and somatic dysfunction of lumbar region: Secondary | ICD-10-CM | POA: Diagnosis not present

## 2016-10-11 DIAGNOSIS — M9901 Segmental and somatic dysfunction of cervical region: Secondary | ICD-10-CM | POA: Diagnosis not present

## 2016-10-11 DIAGNOSIS — M47816 Spondylosis without myelopathy or radiculopathy, lumbar region: Secondary | ICD-10-CM | POA: Diagnosis not present

## 2016-10-17 DIAGNOSIS — M47812 Spondylosis without myelopathy or radiculopathy, cervical region: Secondary | ICD-10-CM | POA: Diagnosis not present

## 2016-10-17 DIAGNOSIS — M9903 Segmental and somatic dysfunction of lumbar region: Secondary | ICD-10-CM | POA: Diagnosis not present

## 2016-10-17 DIAGNOSIS — M9901 Segmental and somatic dysfunction of cervical region: Secondary | ICD-10-CM | POA: Diagnosis not present

## 2016-10-17 DIAGNOSIS — M47816 Spondylosis without myelopathy or radiculopathy, lumbar region: Secondary | ICD-10-CM | POA: Diagnosis not present

## 2016-10-19 DIAGNOSIS — M9901 Segmental and somatic dysfunction of cervical region: Secondary | ICD-10-CM | POA: Diagnosis not present

## 2016-10-19 DIAGNOSIS — M47816 Spondylosis without myelopathy or radiculopathy, lumbar region: Secondary | ICD-10-CM | POA: Diagnosis not present

## 2016-10-19 DIAGNOSIS — M9903 Segmental and somatic dysfunction of lumbar region: Secondary | ICD-10-CM | POA: Diagnosis not present

## 2016-10-19 DIAGNOSIS — M47812 Spondylosis without myelopathy or radiculopathy, cervical region: Secondary | ICD-10-CM | POA: Diagnosis not present

## 2016-10-22 DIAGNOSIS — B309 Viral conjunctivitis, unspecified: Secondary | ICD-10-CM | POA: Diagnosis not present

## 2016-10-22 DIAGNOSIS — N390 Urinary tract infection, site not specified: Secondary | ICD-10-CM | POA: Diagnosis not present

## 2016-10-24 DIAGNOSIS — M47816 Spondylosis without myelopathy or radiculopathy, lumbar region: Secondary | ICD-10-CM | POA: Diagnosis not present

## 2016-10-24 DIAGNOSIS — M9903 Segmental and somatic dysfunction of lumbar region: Secondary | ICD-10-CM | POA: Diagnosis not present

## 2016-10-24 DIAGNOSIS — M9901 Segmental and somatic dysfunction of cervical region: Secondary | ICD-10-CM | POA: Diagnosis not present

## 2016-10-24 DIAGNOSIS — M47812 Spondylosis without myelopathy or radiculopathy, cervical region: Secondary | ICD-10-CM | POA: Diagnosis not present

## 2016-10-26 DIAGNOSIS — R350 Frequency of micturition: Secondary | ICD-10-CM | POA: Diagnosis not present

## 2016-10-26 DIAGNOSIS — M47816 Spondylosis without myelopathy or radiculopathy, lumbar region: Secondary | ICD-10-CM | POA: Diagnosis not present

## 2016-10-26 DIAGNOSIS — M9903 Segmental and somatic dysfunction of lumbar region: Secondary | ICD-10-CM | POA: Diagnosis not present

## 2016-10-26 DIAGNOSIS — M47812 Spondylosis without myelopathy or radiculopathy, cervical region: Secondary | ICD-10-CM | POA: Diagnosis not present

## 2016-10-26 DIAGNOSIS — M9901 Segmental and somatic dysfunction of cervical region: Secondary | ICD-10-CM | POA: Diagnosis not present

## 2016-10-31 DIAGNOSIS — M9903 Segmental and somatic dysfunction of lumbar region: Secondary | ICD-10-CM | POA: Diagnosis not present

## 2016-10-31 DIAGNOSIS — M47812 Spondylosis without myelopathy or radiculopathy, cervical region: Secondary | ICD-10-CM | POA: Diagnosis not present

## 2016-10-31 DIAGNOSIS — M9901 Segmental and somatic dysfunction of cervical region: Secondary | ICD-10-CM | POA: Diagnosis not present

## 2016-10-31 DIAGNOSIS — M47816 Spondylosis without myelopathy or radiculopathy, lumbar region: Secondary | ICD-10-CM | POA: Diagnosis not present

## 2016-11-02 DIAGNOSIS — M47812 Spondylosis without myelopathy or radiculopathy, cervical region: Secondary | ICD-10-CM | POA: Diagnosis not present

## 2016-11-02 DIAGNOSIS — M9901 Segmental and somatic dysfunction of cervical region: Secondary | ICD-10-CM | POA: Diagnosis not present

## 2016-11-02 DIAGNOSIS — M9903 Segmental and somatic dysfunction of lumbar region: Secondary | ICD-10-CM | POA: Diagnosis not present

## 2016-11-02 DIAGNOSIS — M47816 Spondylosis without myelopathy or radiculopathy, lumbar region: Secondary | ICD-10-CM | POA: Diagnosis not present

## 2016-11-07 DIAGNOSIS — M47812 Spondylosis without myelopathy or radiculopathy, cervical region: Secondary | ICD-10-CM | POA: Diagnosis not present

## 2016-11-07 DIAGNOSIS — M9903 Segmental and somatic dysfunction of lumbar region: Secondary | ICD-10-CM | POA: Diagnosis not present

## 2016-11-07 DIAGNOSIS — M9901 Segmental and somatic dysfunction of cervical region: Secondary | ICD-10-CM | POA: Diagnosis not present

## 2016-11-07 DIAGNOSIS — M47816 Spondylosis without myelopathy or radiculopathy, lumbar region: Secondary | ICD-10-CM | POA: Diagnosis not present

## 2016-11-09 DIAGNOSIS — M47812 Spondylosis without myelopathy or radiculopathy, cervical region: Secondary | ICD-10-CM | POA: Diagnosis not present

## 2016-11-09 DIAGNOSIS — M47816 Spondylosis without myelopathy or radiculopathy, lumbar region: Secondary | ICD-10-CM | POA: Diagnosis not present

## 2016-11-09 DIAGNOSIS — M9901 Segmental and somatic dysfunction of cervical region: Secondary | ICD-10-CM | POA: Diagnosis not present

## 2016-11-09 DIAGNOSIS — M9903 Segmental and somatic dysfunction of lumbar region: Secondary | ICD-10-CM | POA: Diagnosis not present

## 2016-11-14 DIAGNOSIS — M9903 Segmental and somatic dysfunction of lumbar region: Secondary | ICD-10-CM | POA: Diagnosis not present

## 2016-11-14 DIAGNOSIS — M9901 Segmental and somatic dysfunction of cervical region: Secondary | ICD-10-CM | POA: Diagnosis not present

## 2016-11-14 DIAGNOSIS — M47816 Spondylosis without myelopathy or radiculopathy, lumbar region: Secondary | ICD-10-CM | POA: Diagnosis not present

## 2016-11-14 DIAGNOSIS — M47812 Spondylosis without myelopathy or radiculopathy, cervical region: Secondary | ICD-10-CM | POA: Diagnosis not present

## 2016-11-17 DIAGNOSIS — M47816 Spondylosis without myelopathy or radiculopathy, lumbar region: Secondary | ICD-10-CM | POA: Diagnosis not present

## 2016-11-17 DIAGNOSIS — M9903 Segmental and somatic dysfunction of lumbar region: Secondary | ICD-10-CM | POA: Diagnosis not present

## 2016-11-17 DIAGNOSIS — M9901 Segmental and somatic dysfunction of cervical region: Secondary | ICD-10-CM | POA: Diagnosis not present

## 2016-11-17 DIAGNOSIS — M47812 Spondylosis without myelopathy or radiculopathy, cervical region: Secondary | ICD-10-CM | POA: Diagnosis not present

## 2016-11-21 DIAGNOSIS — M9901 Segmental and somatic dysfunction of cervical region: Secondary | ICD-10-CM | POA: Diagnosis not present

## 2016-11-21 DIAGNOSIS — M9903 Segmental and somatic dysfunction of lumbar region: Secondary | ICD-10-CM | POA: Diagnosis not present

## 2016-11-21 DIAGNOSIS — M47816 Spondylosis without myelopathy or radiculopathy, lumbar region: Secondary | ICD-10-CM | POA: Diagnosis not present

## 2016-11-21 DIAGNOSIS — M47812 Spondylosis without myelopathy or radiculopathy, cervical region: Secondary | ICD-10-CM | POA: Diagnosis not present

## 2016-11-29 DIAGNOSIS — H40053 Ocular hypertension, bilateral: Secondary | ICD-10-CM | POA: Diagnosis not present

## 2016-12-05 DIAGNOSIS — M47816 Spondylosis without myelopathy or radiculopathy, lumbar region: Secondary | ICD-10-CM | POA: Diagnosis not present

## 2016-12-05 DIAGNOSIS — M9903 Segmental and somatic dysfunction of lumbar region: Secondary | ICD-10-CM | POA: Diagnosis not present

## 2016-12-05 DIAGNOSIS — M9901 Segmental and somatic dysfunction of cervical region: Secondary | ICD-10-CM | POA: Diagnosis not present

## 2016-12-05 DIAGNOSIS — M47812 Spondylosis without myelopathy or radiculopathy, cervical region: Secondary | ICD-10-CM | POA: Diagnosis not present

## 2016-12-16 DIAGNOSIS — Z1231 Encounter for screening mammogram for malignant neoplasm of breast: Secondary | ICD-10-CM | POA: Diagnosis not present

## 2017-01-20 DIAGNOSIS — Z79899 Other long term (current) drug therapy: Secondary | ICD-10-CM | POA: Diagnosis not present

## 2017-01-20 DIAGNOSIS — E782 Mixed hyperlipidemia: Secondary | ICD-10-CM | POA: Diagnosis not present

## 2017-01-26 DIAGNOSIS — L989 Disorder of the skin and subcutaneous tissue, unspecified: Secondary | ICD-10-CM | POA: Diagnosis not present

## 2017-01-26 DIAGNOSIS — F418 Other specified anxiety disorders: Secondary | ICD-10-CM | POA: Diagnosis not present

## 2017-01-26 DIAGNOSIS — Z1211 Encounter for screening for malignant neoplasm of colon: Secondary | ICD-10-CM | POA: Diagnosis not present

## 2017-01-26 DIAGNOSIS — Z0000 Encounter for general adult medical examination without abnormal findings: Secondary | ICD-10-CM | POA: Diagnosis not present

## 2017-01-26 DIAGNOSIS — M1990 Unspecified osteoarthritis, unspecified site: Secondary | ICD-10-CM | POA: Diagnosis not present

## 2017-01-26 DIAGNOSIS — Z23 Encounter for immunization: Secondary | ICD-10-CM | POA: Diagnosis not present

## 2017-01-26 DIAGNOSIS — E782 Mixed hyperlipidemia: Secondary | ICD-10-CM | POA: Diagnosis not present

## 2017-02-01 DIAGNOSIS — Z124 Encounter for screening for malignant neoplasm of cervix: Secondary | ICD-10-CM | POA: Diagnosis not present

## 2017-02-01 DIAGNOSIS — Z01419 Encounter for gynecological examination (general) (routine) without abnormal findings: Secondary | ICD-10-CM | POA: Diagnosis not present

## 2017-02-22 DIAGNOSIS — D2261 Melanocytic nevi of right upper limb, including shoulder: Secondary | ICD-10-CM | POA: Diagnosis not present

## 2017-02-22 DIAGNOSIS — L814 Other melanin hyperpigmentation: Secondary | ICD-10-CM | POA: Diagnosis not present

## 2017-02-22 DIAGNOSIS — D225 Melanocytic nevi of trunk: Secondary | ICD-10-CM | POA: Diagnosis not present

## 2017-02-22 DIAGNOSIS — L821 Other seborrheic keratosis: Secondary | ICD-10-CM | POA: Diagnosis not present

## 2017-02-22 DIAGNOSIS — D0462 Carcinoma in situ of skin of left upper limb, including shoulder: Secondary | ICD-10-CM | POA: Diagnosis not present

## 2017-02-22 DIAGNOSIS — L738 Other specified follicular disorders: Secondary | ICD-10-CM | POA: Diagnosis not present

## 2017-02-22 DIAGNOSIS — D1801 Hemangioma of skin and subcutaneous tissue: Secondary | ICD-10-CM | POA: Diagnosis not present

## 2017-03-01 DIAGNOSIS — H401131 Primary open-angle glaucoma, bilateral, mild stage: Secondary | ICD-10-CM | POA: Diagnosis not present

## 2017-04-13 DIAGNOSIS — I83891 Varicose veins of right lower extremities with other complications: Secondary | ICD-10-CM | POA: Diagnosis not present

## 2017-04-13 DIAGNOSIS — I83811 Varicose veins of right lower extremities with pain: Secondary | ICD-10-CM | POA: Diagnosis not present

## 2017-05-29 DIAGNOSIS — Z1211 Encounter for screening for malignant neoplasm of colon: Secondary | ICD-10-CM | POA: Diagnosis not present

## 2017-06-06 DIAGNOSIS — H26491 Other secondary cataract, right eye: Secondary | ICD-10-CM | POA: Diagnosis not present

## 2017-06-06 DIAGNOSIS — H35341 Macular cyst, hole, or pseudohole, right eye: Secondary | ICD-10-CM | POA: Diagnosis not present

## 2017-06-06 DIAGNOSIS — H43821 Vitreomacular adhesion, right eye: Secondary | ICD-10-CM | POA: Diagnosis not present

## 2017-06-06 DIAGNOSIS — H02413 Mechanical ptosis of bilateral eyelids: Secondary | ICD-10-CM | POA: Diagnosis not present

## 2017-06-13 DIAGNOSIS — H35341 Macular cyst, hole, or pseudohole, right eye: Secondary | ICD-10-CM | POA: Diagnosis not present

## 2017-06-28 DIAGNOSIS — H02403 Unspecified ptosis of bilateral eyelids: Secondary | ICD-10-CM | POA: Diagnosis not present

## 2017-06-28 DIAGNOSIS — H02411 Mechanical ptosis of right eyelid: Secondary | ICD-10-CM | POA: Diagnosis not present

## 2017-06-28 DIAGNOSIS — H02413 Mechanical ptosis of bilateral eyelids: Secondary | ICD-10-CM | POA: Diagnosis not present

## 2017-06-28 DIAGNOSIS — H02412 Mechanical ptosis of left eyelid: Secondary | ICD-10-CM | POA: Diagnosis not present

## 2017-07-11 DIAGNOSIS — H43821 Vitreomacular adhesion, right eye: Secondary | ICD-10-CM | POA: Diagnosis not present

## 2017-07-11 DIAGNOSIS — H35342 Macular cyst, hole, or pseudohole, left eye: Secondary | ICD-10-CM | POA: Diagnosis not present

## 2017-07-21 DIAGNOSIS — H35342 Macular cyst, hole, or pseudohole, left eye: Secondary | ICD-10-CM | POA: Diagnosis not present

## 2017-07-21 DIAGNOSIS — H3581 Retinal edema: Secondary | ICD-10-CM | POA: Diagnosis not present

## 2017-07-21 DIAGNOSIS — H43821 Vitreomacular adhesion, right eye: Secondary | ICD-10-CM | POA: Diagnosis not present

## 2017-07-21 DIAGNOSIS — H35371 Puckering of macula, right eye: Secondary | ICD-10-CM | POA: Diagnosis not present

## 2017-08-10 DIAGNOSIS — H35371 Puckering of macula, right eye: Secondary | ICD-10-CM | POA: Diagnosis not present

## 2017-08-10 DIAGNOSIS — H3581 Retinal edema: Secondary | ICD-10-CM | POA: Diagnosis not present

## 2017-08-10 DIAGNOSIS — H26491 Other secondary cataract, right eye: Secondary | ICD-10-CM | POA: Diagnosis not present

## 2017-08-18 DIAGNOSIS — H35371 Puckering of macula, right eye: Secondary | ICD-10-CM | POA: Diagnosis not present

## 2017-08-23 DIAGNOSIS — M8589 Other specified disorders of bone density and structure, multiple sites: Secondary | ICD-10-CM | POA: Diagnosis not present

## 2017-08-23 DIAGNOSIS — M5412 Radiculopathy, cervical region: Secondary | ICD-10-CM | POA: Diagnosis not present

## 2017-08-23 DIAGNOSIS — M9901 Segmental and somatic dysfunction of cervical region: Secondary | ICD-10-CM | POA: Diagnosis not present

## 2017-08-24 DIAGNOSIS — M5412 Radiculopathy, cervical region: Secondary | ICD-10-CM | POA: Diagnosis not present

## 2017-08-24 DIAGNOSIS — M9901 Segmental and somatic dysfunction of cervical region: Secondary | ICD-10-CM | POA: Diagnosis not present

## 2017-08-28 DIAGNOSIS — M9901 Segmental and somatic dysfunction of cervical region: Secondary | ICD-10-CM | POA: Diagnosis not present

## 2017-08-28 DIAGNOSIS — M5412 Radiculopathy, cervical region: Secondary | ICD-10-CM | POA: Diagnosis not present

## 2017-08-30 DIAGNOSIS — M5412 Radiculopathy, cervical region: Secondary | ICD-10-CM | POA: Diagnosis not present

## 2017-08-30 DIAGNOSIS — M9901 Segmental and somatic dysfunction of cervical region: Secondary | ICD-10-CM | POA: Diagnosis not present

## 2017-08-31 DIAGNOSIS — M9901 Segmental and somatic dysfunction of cervical region: Secondary | ICD-10-CM | POA: Diagnosis not present

## 2017-08-31 DIAGNOSIS — M5412 Radiculopathy, cervical region: Secondary | ICD-10-CM | POA: Diagnosis not present

## 2017-09-04 DIAGNOSIS — M5412 Radiculopathy, cervical region: Secondary | ICD-10-CM | POA: Diagnosis not present

## 2017-09-04 DIAGNOSIS — M9901 Segmental and somatic dysfunction of cervical region: Secondary | ICD-10-CM | POA: Diagnosis not present

## 2017-09-05 DIAGNOSIS — M5412 Radiculopathy, cervical region: Secondary | ICD-10-CM | POA: Diagnosis not present

## 2017-09-05 DIAGNOSIS — M9901 Segmental and somatic dysfunction of cervical region: Secondary | ICD-10-CM | POA: Diagnosis not present

## 2017-09-06 DIAGNOSIS — M5412 Radiculopathy, cervical region: Secondary | ICD-10-CM | POA: Diagnosis not present

## 2017-09-06 DIAGNOSIS — M9901 Segmental and somatic dysfunction of cervical region: Secondary | ICD-10-CM | POA: Diagnosis not present

## 2017-09-11 DIAGNOSIS — M5412 Radiculopathy, cervical region: Secondary | ICD-10-CM | POA: Diagnosis not present

## 2017-09-11 DIAGNOSIS — M9901 Segmental and somatic dysfunction of cervical region: Secondary | ICD-10-CM | POA: Diagnosis not present

## 2017-09-13 DIAGNOSIS — M5412 Radiculopathy, cervical region: Secondary | ICD-10-CM | POA: Diagnosis not present

## 2017-09-13 DIAGNOSIS — H35342 Macular cyst, hole, or pseudohole, left eye: Secondary | ICD-10-CM | POA: Diagnosis not present

## 2017-09-13 DIAGNOSIS — H35371 Puckering of macula, right eye: Secondary | ICD-10-CM | POA: Diagnosis not present

## 2017-09-13 DIAGNOSIS — M9901 Segmental and somatic dysfunction of cervical region: Secondary | ICD-10-CM | POA: Diagnosis not present

## 2017-10-02 DIAGNOSIS — M5412 Radiculopathy, cervical region: Secondary | ICD-10-CM | POA: Diagnosis not present

## 2017-10-02 DIAGNOSIS — M9901 Segmental and somatic dysfunction of cervical region: Secondary | ICD-10-CM | POA: Diagnosis not present

## 2017-10-06 DIAGNOSIS — M19049 Primary osteoarthritis, unspecified hand: Secondary | ICD-10-CM | POA: Diagnosis not present

## 2017-10-06 DIAGNOSIS — R1011 Right upper quadrant pain: Secondary | ICD-10-CM | POA: Diagnosis not present

## 2017-10-09 ENCOUNTER — Other Ambulatory Visit: Payer: Self-pay | Admitting: Family Medicine

## 2017-10-09 DIAGNOSIS — M5412 Radiculopathy, cervical region: Secondary | ICD-10-CM | POA: Diagnosis not present

## 2017-10-09 DIAGNOSIS — R1011 Right upper quadrant pain: Secondary | ICD-10-CM

## 2017-10-09 DIAGNOSIS — M9901 Segmental and somatic dysfunction of cervical region: Secondary | ICD-10-CM | POA: Diagnosis not present

## 2017-10-16 ENCOUNTER — Other Ambulatory Visit: Payer: Medicare Other

## 2017-10-16 DIAGNOSIS — M5412 Radiculopathy, cervical region: Secondary | ICD-10-CM | POA: Diagnosis not present

## 2017-10-16 DIAGNOSIS — M9901 Segmental and somatic dysfunction of cervical region: Secondary | ICD-10-CM | POA: Diagnosis not present

## 2017-10-20 ENCOUNTER — Ambulatory Visit
Admission: RE | Admit: 2017-10-20 | Discharge: 2017-10-20 | Disposition: A | Discharge status: Home / Self Care | Payer: Medicare Other | Source: Ambulatory Visit | Attending: Family Medicine | Admitting: Family Medicine

## 2017-10-20 DIAGNOSIS — N200 Calculus of kidney: Secondary | ICD-10-CM | POA: Diagnosis not present

## 2017-10-20 DIAGNOSIS — K828 Other specified diseases of gallbladder: Secondary | ICD-10-CM | POA: Diagnosis not present

## 2017-10-20 DIAGNOSIS — R1011 Right upper quadrant pain: Secondary | ICD-10-CM

## 2017-11-03 ENCOUNTER — Other Ambulatory Visit (HOSPITAL_COMMUNITY): Payer: Self-pay | Admitting: Family Medicine

## 2017-11-03 DIAGNOSIS — R1013 Epigastric pain: Secondary | ICD-10-CM

## 2017-11-07 ENCOUNTER — Encounter (HOSPITAL_COMMUNITY)
Admission: RE | Admit: 2017-11-07 | Discharge: 2017-11-07 | Disposition: A | Discharge status: Home / Self Care | Payer: Medicare Other | Source: Ambulatory Visit | Attending: Family Medicine | Admitting: Family Medicine

## 2017-11-07 DIAGNOSIS — R1310 Dysphagia, unspecified: Secondary | ICD-10-CM | POA: Diagnosis not present

## 2017-11-07 DIAGNOSIS — R1013 Epigastric pain: Secondary | ICD-10-CM | POA: Diagnosis not present

## 2017-11-07 MED ORDER — TECHNETIUM TC 99M MEBROFENIN IV KIT
5.5000 | PACK | Freq: Once | INTRAVENOUS | Status: AC | PRN
  Administered 2017-11-07: 5.5 via INTRAVENOUS

## 2017-12-18 DIAGNOSIS — Z1231 Encounter for screening mammogram for malignant neoplasm of breast: Secondary | ICD-10-CM | POA: Diagnosis not present

## 2018-02-28 DIAGNOSIS — E782 Mixed hyperlipidemia: Secondary | ICD-10-CM | POA: Diagnosis not present

## 2018-02-28 DIAGNOSIS — Z79899 Other long term (current) drug therapy: Secondary | ICD-10-CM | POA: Diagnosis not present

## 2018-03-05 DIAGNOSIS — Z79899 Other long term (current) drug therapy: Secondary | ICD-10-CM | POA: Diagnosis not present

## 2018-03-05 DIAGNOSIS — F411 Generalized anxiety disorder: Secondary | ICD-10-CM | POA: Diagnosis not present

## 2018-03-05 DIAGNOSIS — M19049 Primary osteoarthritis, unspecified hand: Secondary | ICD-10-CM | POA: Diagnosis not present

## 2018-03-05 DIAGNOSIS — R7301 Impaired fasting glucose: Secondary | ICD-10-CM | POA: Diagnosis not present

## 2018-03-05 DIAGNOSIS — Z1159 Encounter for screening for other viral diseases: Secondary | ICD-10-CM | POA: Diagnosis not present

## 2018-03-05 DIAGNOSIS — L309 Dermatitis, unspecified: Secondary | ICD-10-CM | POA: Diagnosis not present

## 2018-03-05 DIAGNOSIS — E782 Mixed hyperlipidemia: Secondary | ICD-10-CM | POA: Diagnosis not present

## 2018-03-05 DIAGNOSIS — Z0000 Encounter for general adult medical examination without abnormal findings: Secondary | ICD-10-CM | POA: Diagnosis not present

## 2018-05-14 DIAGNOSIS — I8311 Varicose veins of right lower extremity with inflammation: Secondary | ICD-10-CM | POA: Diagnosis not present

## 2018-05-14 DIAGNOSIS — I83891 Varicose veins of right lower extremities with other complications: Secondary | ICD-10-CM | POA: Diagnosis not present

## 2018-05-28 DIAGNOSIS — H52223 Regular astigmatism, bilateral: Secondary | ICD-10-CM | POA: Diagnosis not present

## 2018-05-30 DIAGNOSIS — Z01419 Encounter for gynecological examination (general) (routine) without abnormal findings: Secondary | ICD-10-CM | POA: Diagnosis not present

## 2018-06-08 DIAGNOSIS — J019 Acute sinusitis, unspecified: Secondary | ICD-10-CM | POA: Diagnosis not present

## 2018-09-03 DIAGNOSIS — H401131 Primary open-angle glaucoma, bilateral, mild stage: Secondary | ICD-10-CM | POA: Diagnosis not present

## 2018-09-11 DIAGNOSIS — R0981 Nasal congestion: Secondary | ICD-10-CM | POA: Diagnosis not present

## 2018-10-16 DIAGNOSIS — L570 Actinic keratosis: Secondary | ICD-10-CM | POA: Diagnosis not present

## 2018-10-16 DIAGNOSIS — D2261 Melanocytic nevi of right upper limb, including shoulder: Secondary | ICD-10-CM | POA: Diagnosis not present

## 2018-10-16 DIAGNOSIS — L814 Other melanin hyperpigmentation: Secondary | ICD-10-CM | POA: Diagnosis not present

## 2018-10-16 DIAGNOSIS — D2239 Melanocytic nevi of other parts of face: Secondary | ICD-10-CM | POA: Diagnosis not present

## 2018-10-16 DIAGNOSIS — D224 Melanocytic nevi of scalp and neck: Secondary | ICD-10-CM | POA: Diagnosis not present

## 2018-10-16 DIAGNOSIS — Z85828 Personal history of other malignant neoplasm of skin: Secondary | ICD-10-CM | POA: Diagnosis not present

## 2018-10-16 DIAGNOSIS — D1801 Hemangioma of skin and subcutaneous tissue: Secondary | ICD-10-CM | POA: Diagnosis not present

## 2018-10-16 DIAGNOSIS — L821 Other seborrheic keratosis: Secondary | ICD-10-CM | POA: Diagnosis not present

## 2018-10-21 DIAGNOSIS — Z20828 Contact with and (suspected) exposure to other viral communicable diseases: Secondary | ICD-10-CM | POA: Diagnosis not present

## 2018-12-04 DIAGNOSIS — M9902 Segmental and somatic dysfunction of thoracic region: Secondary | ICD-10-CM | POA: Diagnosis not present

## 2018-12-04 DIAGNOSIS — M9903 Segmental and somatic dysfunction of lumbar region: Secondary | ICD-10-CM | POA: Diagnosis not present

## 2018-12-04 DIAGNOSIS — M47812 Spondylosis without myelopathy or radiculopathy, cervical region: Secondary | ICD-10-CM | POA: Diagnosis not present

## 2018-12-04 DIAGNOSIS — M9901 Segmental and somatic dysfunction of cervical region: Secondary | ICD-10-CM | POA: Diagnosis not present

## 2018-12-04 DIAGNOSIS — M62830 Muscle spasm of back: Secondary | ICD-10-CM | POA: Diagnosis not present

## 2018-12-04 DIAGNOSIS — M5441 Lumbago with sciatica, right side: Secondary | ICD-10-CM | POA: Diagnosis not present

## 2018-12-10 DIAGNOSIS — M9901 Segmental and somatic dysfunction of cervical region: Secondary | ICD-10-CM | POA: Diagnosis not present

## 2018-12-10 DIAGNOSIS — M9902 Segmental and somatic dysfunction of thoracic region: Secondary | ICD-10-CM | POA: Diagnosis not present

## 2018-12-10 DIAGNOSIS — M62830 Muscle spasm of back: Secondary | ICD-10-CM | POA: Diagnosis not present

## 2018-12-10 DIAGNOSIS — M9903 Segmental and somatic dysfunction of lumbar region: Secondary | ICD-10-CM | POA: Diagnosis not present

## 2018-12-10 DIAGNOSIS — M5441 Lumbago with sciatica, right side: Secondary | ICD-10-CM | POA: Diagnosis not present

## 2018-12-10 DIAGNOSIS — M47812 Spondylosis without myelopathy or radiculopathy, cervical region: Secondary | ICD-10-CM | POA: Diagnosis not present

## 2018-12-11 DIAGNOSIS — M47812 Spondylosis without myelopathy or radiculopathy, cervical region: Secondary | ICD-10-CM | POA: Diagnosis not present

## 2018-12-11 DIAGNOSIS — M9902 Segmental and somatic dysfunction of thoracic region: Secondary | ICD-10-CM | POA: Diagnosis not present

## 2018-12-11 DIAGNOSIS — M9901 Segmental and somatic dysfunction of cervical region: Secondary | ICD-10-CM | POA: Diagnosis not present

## 2018-12-11 DIAGNOSIS — M5441 Lumbago with sciatica, right side: Secondary | ICD-10-CM | POA: Diagnosis not present

## 2018-12-11 DIAGNOSIS — M9903 Segmental and somatic dysfunction of lumbar region: Secondary | ICD-10-CM | POA: Diagnosis not present

## 2018-12-11 DIAGNOSIS — M62830 Muscle spasm of back: Secondary | ICD-10-CM | POA: Diagnosis not present

## 2018-12-18 DIAGNOSIS — M47812 Spondylosis without myelopathy or radiculopathy, cervical region: Secondary | ICD-10-CM | POA: Diagnosis not present

## 2018-12-18 DIAGNOSIS — M5441 Lumbago with sciatica, right side: Secondary | ICD-10-CM | POA: Diagnosis not present

## 2018-12-18 DIAGNOSIS — M9903 Segmental and somatic dysfunction of lumbar region: Secondary | ICD-10-CM | POA: Diagnosis not present

## 2018-12-18 DIAGNOSIS — M9902 Segmental and somatic dysfunction of thoracic region: Secondary | ICD-10-CM | POA: Diagnosis not present

## 2018-12-18 DIAGNOSIS — M9901 Segmental and somatic dysfunction of cervical region: Secondary | ICD-10-CM | POA: Diagnosis not present

## 2018-12-18 DIAGNOSIS — M62830 Muscle spasm of back: Secondary | ICD-10-CM | POA: Diagnosis not present

## 2018-12-19 DIAGNOSIS — M62830 Muscle spasm of back: Secondary | ICD-10-CM | POA: Diagnosis not present

## 2018-12-19 DIAGNOSIS — M5441 Lumbago with sciatica, right side: Secondary | ICD-10-CM | POA: Diagnosis not present

## 2018-12-19 DIAGNOSIS — M9903 Segmental and somatic dysfunction of lumbar region: Secondary | ICD-10-CM | POA: Diagnosis not present

## 2018-12-19 DIAGNOSIS — M47812 Spondylosis without myelopathy or radiculopathy, cervical region: Secondary | ICD-10-CM | POA: Diagnosis not present

## 2018-12-19 DIAGNOSIS — M9901 Segmental and somatic dysfunction of cervical region: Secondary | ICD-10-CM | POA: Diagnosis not present

## 2018-12-19 DIAGNOSIS — M9902 Segmental and somatic dysfunction of thoracic region: Secondary | ICD-10-CM | POA: Diagnosis not present

## 2018-12-24 DIAGNOSIS — M47812 Spondylosis without myelopathy or radiculopathy, cervical region: Secondary | ICD-10-CM | POA: Diagnosis not present

## 2018-12-24 DIAGNOSIS — Z803 Family history of malignant neoplasm of breast: Secondary | ICD-10-CM | POA: Diagnosis not present

## 2018-12-24 DIAGNOSIS — M5441 Lumbago with sciatica, right side: Secondary | ICD-10-CM | POA: Diagnosis not present

## 2018-12-24 DIAGNOSIS — M9901 Segmental and somatic dysfunction of cervical region: Secondary | ICD-10-CM | POA: Diagnosis not present

## 2018-12-24 DIAGNOSIS — Z1231 Encounter for screening mammogram for malignant neoplasm of breast: Secondary | ICD-10-CM | POA: Diagnosis not present

## 2018-12-24 DIAGNOSIS — M9902 Segmental and somatic dysfunction of thoracic region: Secondary | ICD-10-CM | POA: Diagnosis not present

## 2018-12-24 DIAGNOSIS — M9903 Segmental and somatic dysfunction of lumbar region: Secondary | ICD-10-CM | POA: Diagnosis not present

## 2018-12-24 DIAGNOSIS — M62830 Muscle spasm of back: Secondary | ICD-10-CM | POA: Diagnosis not present

## 2018-12-26 DIAGNOSIS — M9901 Segmental and somatic dysfunction of cervical region: Secondary | ICD-10-CM | POA: Diagnosis not present

## 2018-12-26 DIAGNOSIS — M5441 Lumbago with sciatica, right side: Secondary | ICD-10-CM | POA: Diagnosis not present

## 2018-12-26 DIAGNOSIS — M47812 Spondylosis without myelopathy or radiculopathy, cervical region: Secondary | ICD-10-CM | POA: Diagnosis not present

## 2018-12-26 DIAGNOSIS — M9902 Segmental and somatic dysfunction of thoracic region: Secondary | ICD-10-CM | POA: Diagnosis not present

## 2018-12-26 DIAGNOSIS — M9903 Segmental and somatic dysfunction of lumbar region: Secondary | ICD-10-CM | POA: Diagnosis not present

## 2018-12-26 DIAGNOSIS — M62830 Muscle spasm of back: Secondary | ICD-10-CM | POA: Diagnosis not present

## 2018-12-31 DIAGNOSIS — M62830 Muscle spasm of back: Secondary | ICD-10-CM | POA: Diagnosis not present

## 2018-12-31 DIAGNOSIS — M9901 Segmental and somatic dysfunction of cervical region: Secondary | ICD-10-CM | POA: Diagnosis not present

## 2018-12-31 DIAGNOSIS — M47812 Spondylosis without myelopathy or radiculopathy, cervical region: Secondary | ICD-10-CM | POA: Diagnosis not present

## 2018-12-31 DIAGNOSIS — M9903 Segmental and somatic dysfunction of lumbar region: Secondary | ICD-10-CM | POA: Diagnosis not present

## 2018-12-31 DIAGNOSIS — M5441 Lumbago with sciatica, right side: Secondary | ICD-10-CM | POA: Diagnosis not present

## 2018-12-31 DIAGNOSIS — M9902 Segmental and somatic dysfunction of thoracic region: Secondary | ICD-10-CM | POA: Diagnosis not present

## 2019-01-02 DIAGNOSIS — M62830 Muscle spasm of back: Secondary | ICD-10-CM | POA: Diagnosis not present

## 2019-01-02 DIAGNOSIS — M47812 Spondylosis without myelopathy or radiculopathy, cervical region: Secondary | ICD-10-CM | POA: Diagnosis not present

## 2019-01-02 DIAGNOSIS — M9901 Segmental and somatic dysfunction of cervical region: Secondary | ICD-10-CM | POA: Diagnosis not present

## 2019-01-02 DIAGNOSIS — M9902 Segmental and somatic dysfunction of thoracic region: Secondary | ICD-10-CM | POA: Diagnosis not present

## 2019-01-02 DIAGNOSIS — M5441 Lumbago with sciatica, right side: Secondary | ICD-10-CM | POA: Diagnosis not present

## 2019-01-02 DIAGNOSIS — M9903 Segmental and somatic dysfunction of lumbar region: Secondary | ICD-10-CM | POA: Diagnosis not present

## 2019-01-03 DIAGNOSIS — H524 Presbyopia: Secondary | ICD-10-CM | POA: Diagnosis not present

## 2019-01-07 DIAGNOSIS — M62830 Muscle spasm of back: Secondary | ICD-10-CM | POA: Diagnosis not present

## 2019-01-07 DIAGNOSIS — M9903 Segmental and somatic dysfunction of lumbar region: Secondary | ICD-10-CM | POA: Diagnosis not present

## 2019-01-07 DIAGNOSIS — M9902 Segmental and somatic dysfunction of thoracic region: Secondary | ICD-10-CM | POA: Diagnosis not present

## 2019-01-07 DIAGNOSIS — M5441 Lumbago with sciatica, right side: Secondary | ICD-10-CM | POA: Diagnosis not present

## 2019-01-07 DIAGNOSIS — M47812 Spondylosis without myelopathy or radiculopathy, cervical region: Secondary | ICD-10-CM | POA: Diagnosis not present

## 2019-01-07 DIAGNOSIS — M9901 Segmental and somatic dysfunction of cervical region: Secondary | ICD-10-CM | POA: Diagnosis not present

## 2019-01-09 DIAGNOSIS — M62830 Muscle spasm of back: Secondary | ICD-10-CM | POA: Diagnosis not present

## 2019-01-09 DIAGNOSIS — M9902 Segmental and somatic dysfunction of thoracic region: Secondary | ICD-10-CM | POA: Diagnosis not present

## 2019-01-09 DIAGNOSIS — M9901 Segmental and somatic dysfunction of cervical region: Secondary | ICD-10-CM | POA: Diagnosis not present

## 2019-01-09 DIAGNOSIS — M5441 Lumbago with sciatica, right side: Secondary | ICD-10-CM | POA: Diagnosis not present

## 2019-01-09 DIAGNOSIS — M47812 Spondylosis without myelopathy or radiculopathy, cervical region: Secondary | ICD-10-CM | POA: Diagnosis not present

## 2019-01-09 DIAGNOSIS — M9903 Segmental and somatic dysfunction of lumbar region: Secondary | ICD-10-CM | POA: Diagnosis not present

## 2019-01-14 DIAGNOSIS — R69 Illness, unspecified: Secondary | ICD-10-CM | POA: Diagnosis not present

## 2019-02-01 DIAGNOSIS — H40053 Ocular hypertension, bilateral: Secondary | ICD-10-CM | POA: Diagnosis not present

## 2019-02-11 IMAGING — NM NM HEPATO W/GB/PHARM/[PERSON_NAME]
2 series · 12 of 12 positions shown · non-contrast
Comparison: None.

CLINICAL DATA: Right upper quadrant pain and dyspepsia

EXAM:
NUCLEAR MEDICINE HEPATOBILIARY IMAGING WITH GALLBLADDER EF
VIEWS:
Anterior right upper quadrant
RADIOPHARMACEUTICALS:  5.5 mCi 5c-OOm  Choletec IV

[Series 1: biliary · 3.25mm/px · 6 of 60 frames shown]
[frame 6/60]
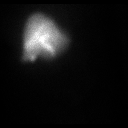
[frame 16/60]
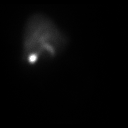
[frame 26/60]
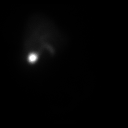
[frame 36/60]
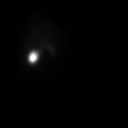
[frame 46/60]
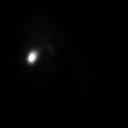
[frame 56/60]
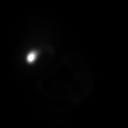

[Series 2: gbef · 3.25mm/px · 6 of 60 frames shown]
[frame 6/60]
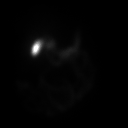
[frame 16/60]
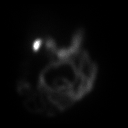
[frame 26/60]
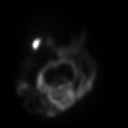
[frame 36/60]
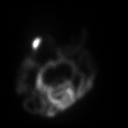
[frame 46/60]
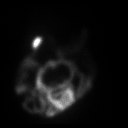
[frame 56/60]
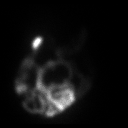

[12 of 12 positions shown; findings below may reference images not displayed]

FINDINGS: Liver uptake of radiotracer is normal. There is prompt visualization
of gallbladder and small bowel, indicating patency of the cystic and
common bile ducts. Patient consumed 8 ounces of Ensure orally with
calculation of the computer generated ejection fraction of
radiotracer from the gallbladder. The patient did not experience
clinical symptoms with the oral Ensure consumption. The computer
generated ejection fraction of radiotracer from the gallbladder is
normal at 88%, normal greater than 33% using the oral agent.
IMPRESSION: Study within normal limits.

## 2019-03-04 DIAGNOSIS — H40053 Ocular hypertension, bilateral: Secondary | ICD-10-CM | POA: Diagnosis not present

## 2019-04-02 DIAGNOSIS — I8311 Varicose veins of right lower extremity with inflammation: Secondary | ICD-10-CM | POA: Diagnosis not present

## 2019-04-02 DIAGNOSIS — G2581 Restless legs syndrome: Secondary | ICD-10-CM | POA: Diagnosis not present

## 2019-04-03 DIAGNOSIS — L509 Urticaria, unspecified: Secondary | ICD-10-CM | POA: Diagnosis not present

## 2019-04-12 DIAGNOSIS — H401121 Primary open-angle glaucoma, left eye, mild stage: Secondary | ICD-10-CM | POA: Diagnosis not present

## 2019-04-12 DIAGNOSIS — H401111 Primary open-angle glaucoma, right eye, mild stage: Secondary | ICD-10-CM | POA: Diagnosis not present

## 2019-04-18 DIAGNOSIS — H401121 Primary open-angle glaucoma, left eye, mild stage: Secondary | ICD-10-CM | POA: Diagnosis not present

## 2019-04-20 ENCOUNTER — Ambulatory Visit: Payer: Medicare HMO | Attending: Internal Medicine

## 2019-04-20 DIAGNOSIS — Z23 Encounter for immunization: Secondary | ICD-10-CM | POA: Insufficient documentation

## 2019-04-20 NOTE — Progress Notes (Signed)
   Covid-19 Vaccination Clinic  Name:  SHREEYA RECENDIZ    MRN: [id number] DOB: 09-02-1945  04/20/2019  Ms. Brunelle was observed post Covid-19 immunization for 15 minutes without incidence. She was provided with Vaccine Information Sheet and instruction to access the V-Safe system.   Ms. Bera was instructed to call 911 with any severe reactions post vaccine: Marland Kitchen Difficulty breathing  . Swelling of your face and throat  . A fast heartbeat  . A bad rash all over your body  . Dizziness and weakness    Immunizations Administered    Name Date Dose VIS Date Route   Pfizer COVID-19 Vaccine 04/20/2019 11:54 AM 0.3 mL 03/08/2019 Intramuscular   Manufacturer: [place]   Lot: [id number]   [place]: [address]-5

## 2019-05-09 DIAGNOSIS — H40053 Ocular hypertension, bilateral: Secondary | ICD-10-CM | POA: Diagnosis not present

## 2019-05-11 ENCOUNTER — Ambulatory Visit: Payer: Medicare HMO

## 2019-05-12 ENCOUNTER — Ambulatory Visit: Payer: Medicare HMO

## 2019-05-20 ENCOUNTER — Ambulatory Visit: Payer: Medicare HMO

## 2019-05-23 ENCOUNTER — Ambulatory Visit: Payer: Medicare HMO | Attending: Internal Medicine

## 2019-05-23 DIAGNOSIS — Z23 Encounter for immunization: Secondary | ICD-10-CM

## 2019-05-23 NOTE — Progress Notes (Signed)
   Covid-19 Vaccination Clinic  Name:  MARCELLINE TEMKIN    MRN: [id number] DOB: 01/28/1946  05/23/2019  Ms. Karas was observed post Covid-19 immunization for 15 minutes without incidence. She was provided with Vaccine Information Sheet and instruction to access the V-Safe system.   Ms. Noyes was instructed to call 911 with any severe reactions post vaccine: Marland Kitchen Difficulty breathing  . Swelling of your face and throat  . A fast heartbeat  . A bad rash all over your body  . Dizziness and weakness    Immunizations Administered    Name Date Dose VIS Date Route   Pfizer COVID-19 Vaccine 05/23/2019  9:22 AM 0.3 mL 03/08/2019 Intramuscular   Manufacturer: [place]   Lot: [id number]   [place]: [address]-1

## 2019-06-07 DIAGNOSIS — Z79899 Other long term (current) drug therapy: Secondary | ICD-10-CM | POA: Diagnosis not present

## 2019-06-07 DIAGNOSIS — E782 Mixed hyperlipidemia: Secondary | ICD-10-CM | POA: Diagnosis not present

## 2019-06-07 DIAGNOSIS — R69 Illness, unspecified: Secondary | ICD-10-CM | POA: Diagnosis not present

## 2019-06-07 DIAGNOSIS — Z0000 Encounter for general adult medical examination without abnormal findings: Secondary | ICD-10-CM | POA: Diagnosis not present

## 2019-06-07 DIAGNOSIS — R7301 Impaired fasting glucose: Secondary | ICD-10-CM | POA: Diagnosis not present

## 2019-06-07 DIAGNOSIS — Z23 Encounter for immunization: Secondary | ICD-10-CM | POA: Diagnosis not present

## 2019-06-07 DIAGNOSIS — E669 Obesity, unspecified: Secondary | ICD-10-CM | POA: Diagnosis not present

## 2019-06-07 DIAGNOSIS — M19049 Primary osteoarthritis, unspecified hand: Secondary | ICD-10-CM | POA: Diagnosis not present

## 2019-06-07 DIAGNOSIS — L309 Dermatitis, unspecified: Secondary | ICD-10-CM | POA: Diagnosis not present

## 2019-06-12 DIAGNOSIS — E669 Obesity, unspecified: Secondary | ICD-10-CM | POA: Diagnosis not present

## 2019-06-12 DIAGNOSIS — R7301 Impaired fasting glucose: Secondary | ICD-10-CM | POA: Diagnosis not present

## 2019-06-12 DIAGNOSIS — Z0000 Encounter for general adult medical examination without abnormal findings: Secondary | ICD-10-CM | POA: Diagnosis not present

## 2019-06-12 DIAGNOSIS — M19049 Primary osteoarthritis, unspecified hand: Secondary | ICD-10-CM | POA: Diagnosis not present

## 2019-06-12 DIAGNOSIS — L309 Dermatitis, unspecified: Secondary | ICD-10-CM | POA: Diagnosis not present

## 2019-06-12 DIAGNOSIS — R69 Illness, unspecified: Secondary | ICD-10-CM | POA: Diagnosis not present

## 2019-06-12 DIAGNOSIS — Z1159 Encounter for screening for other viral diseases: Secondary | ICD-10-CM | POA: Diagnosis not present

## 2019-06-12 DIAGNOSIS — E782 Mixed hyperlipidemia: Secondary | ICD-10-CM | POA: Diagnosis not present

## 2019-06-12 DIAGNOSIS — Z79899 Other long term (current) drug therapy: Secondary | ICD-10-CM | POA: Diagnosis not present

## 2019-06-14 DIAGNOSIS — M1990 Unspecified osteoarthritis, unspecified site: Secondary | ICD-10-CM | POA: Diagnosis not present

## 2019-06-14 DIAGNOSIS — Z823 Family history of stroke: Secondary | ICD-10-CM | POA: Diagnosis not present

## 2019-06-14 DIAGNOSIS — Z6832 Body mass index (BMI) 32.0-32.9, adult: Secondary | ICD-10-CM | POA: Diagnosis not present

## 2019-06-14 DIAGNOSIS — H409 Unspecified glaucoma: Secondary | ICD-10-CM | POA: Diagnosis not present

## 2019-06-14 DIAGNOSIS — R32 Unspecified urinary incontinence: Secondary | ICD-10-CM | POA: Diagnosis not present

## 2019-06-14 DIAGNOSIS — E785 Hyperlipidemia, unspecified: Secondary | ICD-10-CM | POA: Diagnosis not present

## 2019-06-14 DIAGNOSIS — Z791 Long term (current) use of non-steroidal anti-inflammatories (NSAID): Secondary | ICD-10-CM | POA: Diagnosis not present

## 2019-06-14 DIAGNOSIS — E669 Obesity, unspecified: Secondary | ICD-10-CM | POA: Diagnosis not present

## 2019-06-14 DIAGNOSIS — R69 Illness, unspecified: Secondary | ICD-10-CM | POA: Diagnosis not present

## 2019-08-15 DIAGNOSIS — Z01419 Encounter for gynecological examination (general) (routine) without abnormal findings: Secondary | ICD-10-CM | POA: Diagnosis not present

## 2019-08-15 DIAGNOSIS — R69 Illness, unspecified: Secondary | ICD-10-CM | POA: Diagnosis not present

## 2019-08-27 DIAGNOSIS — M85851 Other specified disorders of bone density and structure, right thigh: Secondary | ICD-10-CM | POA: Diagnosis not present

## 2019-08-27 DIAGNOSIS — M85852 Other specified disorders of bone density and structure, left thigh: Secondary | ICD-10-CM | POA: Diagnosis not present

## 2019-08-29 DIAGNOSIS — H401121 Primary open-angle glaucoma, left eye, mild stage: Secondary | ICD-10-CM | POA: Diagnosis not present

## 2019-10-16 DIAGNOSIS — D1801 Hemangioma of skin and subcutaneous tissue: Secondary | ICD-10-CM | POA: Diagnosis not present

## 2019-10-16 DIAGNOSIS — L570 Actinic keratosis: Secondary | ICD-10-CM | POA: Diagnosis not present

## 2019-10-16 DIAGNOSIS — L814 Other melanin hyperpigmentation: Secondary | ICD-10-CM | POA: Diagnosis not present

## 2019-10-16 DIAGNOSIS — L821 Other seborrheic keratosis: Secondary | ICD-10-CM | POA: Diagnosis not present

## 2019-10-23 DIAGNOSIS — M25519 Pain in unspecified shoulder: Secondary | ICD-10-CM | POA: Diagnosis not present

## 2019-10-31 DIAGNOSIS — M25611 Stiffness of right shoulder, not elsewhere classified: Secondary | ICD-10-CM | POA: Diagnosis not present

## 2019-10-31 DIAGNOSIS — S46011D Strain of muscle(s) and tendon(s) of the rotator cuff of right shoulder, subsequent encounter: Secondary | ICD-10-CM | POA: Diagnosis not present

## 2019-10-31 DIAGNOSIS — M6281 Muscle weakness (generalized): Secondary | ICD-10-CM | POA: Diagnosis not present

## 2019-11-06 DIAGNOSIS — S46011D Strain of muscle(s) and tendon(s) of the rotator cuff of right shoulder, subsequent encounter: Secondary | ICD-10-CM | POA: Diagnosis not present

## 2019-11-06 DIAGNOSIS — M25611 Stiffness of right shoulder, not elsewhere classified: Secondary | ICD-10-CM | POA: Diagnosis not present

## 2019-11-06 DIAGNOSIS — M6281 Muscle weakness (generalized): Secondary | ICD-10-CM | POA: Diagnosis not present

## 2019-11-08 DIAGNOSIS — M25611 Stiffness of right shoulder, not elsewhere classified: Secondary | ICD-10-CM | POA: Diagnosis not present

## 2019-11-08 DIAGNOSIS — S46011D Strain of muscle(s) and tendon(s) of the rotator cuff of right shoulder, subsequent encounter: Secondary | ICD-10-CM | POA: Diagnosis not present

## 2019-11-08 DIAGNOSIS — M6281 Muscle weakness (generalized): Secondary | ICD-10-CM | POA: Diagnosis not present

## 2019-11-13 DIAGNOSIS — M6281 Muscle weakness (generalized): Secondary | ICD-10-CM | POA: Diagnosis not present

## 2019-11-13 DIAGNOSIS — S46011D Strain of muscle(s) and tendon(s) of the rotator cuff of right shoulder, subsequent encounter: Secondary | ICD-10-CM | POA: Diagnosis not present

## 2019-11-13 DIAGNOSIS — M25611 Stiffness of right shoulder, not elsewhere classified: Secondary | ICD-10-CM | POA: Diagnosis not present

## 2019-11-15 DIAGNOSIS — S46011D Strain of muscle(s) and tendon(s) of the rotator cuff of right shoulder, subsequent encounter: Secondary | ICD-10-CM | POA: Diagnosis not present

## 2019-11-15 DIAGNOSIS — M6281 Muscle weakness (generalized): Secondary | ICD-10-CM | POA: Diagnosis not present

## 2019-11-15 DIAGNOSIS — M25611 Stiffness of right shoulder, not elsewhere classified: Secondary | ICD-10-CM | POA: Diagnosis not present

## 2019-11-18 DIAGNOSIS — M25611 Stiffness of right shoulder, not elsewhere classified: Secondary | ICD-10-CM | POA: Diagnosis not present

## 2019-11-18 DIAGNOSIS — S46011D Strain of muscle(s) and tendon(s) of the rotator cuff of right shoulder, subsequent encounter: Secondary | ICD-10-CM | POA: Diagnosis not present

## 2019-11-18 DIAGNOSIS — M6281 Muscle weakness (generalized): Secondary | ICD-10-CM | POA: Diagnosis not present

## 2019-11-20 DIAGNOSIS — Z20822 Contact with and (suspected) exposure to covid-19: Secondary | ICD-10-CM | POA: Diagnosis not present

## 2019-11-20 DIAGNOSIS — R0981 Nasal congestion: Secondary | ICD-10-CM | POA: Diagnosis not present

## 2019-11-20 DIAGNOSIS — R5383 Other fatigue: Secondary | ICD-10-CM | POA: Diagnosis not present

## 2019-11-25 DIAGNOSIS — M6281 Muscle weakness (generalized): Secondary | ICD-10-CM | POA: Diagnosis not present

## 2019-11-25 DIAGNOSIS — S46011D Strain of muscle(s) and tendon(s) of the rotator cuff of right shoulder, subsequent encounter: Secondary | ICD-10-CM | POA: Diagnosis not present

## 2019-11-25 DIAGNOSIS — M25611 Stiffness of right shoulder, not elsewhere classified: Secondary | ICD-10-CM | POA: Diagnosis not present

## 2019-12-30 DIAGNOSIS — H52223 Regular astigmatism, bilateral: Secondary | ICD-10-CM | POA: Diagnosis not present

## 2019-12-30 DIAGNOSIS — R69 Illness, unspecified: Secondary | ICD-10-CM | POA: Diagnosis not present

## 2019-12-30 DIAGNOSIS — I700 Atherosclerosis of aorta: Secondary | ICD-10-CM | POA: Diagnosis not present

## 2019-12-30 DIAGNOSIS — Z23 Encounter for immunization: Secondary | ICD-10-CM | POA: Diagnosis not present

## 2019-12-30 DIAGNOSIS — R3915 Urgency of urination: Secondary | ICD-10-CM | POA: Diagnosis not present

## 2020-01-01 DIAGNOSIS — Z1231 Encounter for screening mammogram for malignant neoplasm of breast: Secondary | ICD-10-CM | POA: Diagnosis not present

## 2020-01-15 DIAGNOSIS — H524 Presbyopia: Secondary | ICD-10-CM | POA: Diagnosis not present

## 2020-01-15 DIAGNOSIS — R928 Other abnormal and inconclusive findings on diagnostic imaging of breast: Secondary | ICD-10-CM | POA: Diagnosis not present

## 2020-01-15 DIAGNOSIS — H52223 Regular astigmatism, bilateral: Secondary | ICD-10-CM | POA: Diagnosis not present

## 2020-02-05 DIAGNOSIS — H401131 Primary open-angle glaucoma, bilateral, mild stage: Secondary | ICD-10-CM | POA: Diagnosis not present

## 2020-02-10 DIAGNOSIS — M19049 Primary osteoarthritis, unspecified hand: Secondary | ICD-10-CM | POA: Diagnosis not present

## 2020-02-10 DIAGNOSIS — E782 Mixed hyperlipidemia: Secondary | ICD-10-CM | POA: Diagnosis not present

## 2020-02-10 DIAGNOSIS — M1990 Unspecified osteoarthritis, unspecified site: Secondary | ICD-10-CM | POA: Diagnosis not present

## 2020-04-06 DIAGNOSIS — I8311 Varicose veins of right lower extremity with inflammation: Secondary | ICD-10-CM | POA: Diagnosis not present

## 2020-04-06 DIAGNOSIS — I8312 Varicose veins of left lower extremity with inflammation: Secondary | ICD-10-CM | POA: Diagnosis not present

## 2020-04-27 DIAGNOSIS — E782 Mixed hyperlipidemia: Secondary | ICD-10-CM | POA: Diagnosis not present

## 2020-04-27 DIAGNOSIS — M19049 Primary osteoarthritis, unspecified hand: Secondary | ICD-10-CM | POA: Diagnosis not present

## 2020-04-27 DIAGNOSIS — M1990 Unspecified osteoarthritis, unspecified site: Secondary | ICD-10-CM | POA: Diagnosis not present

## 2020-05-04 DIAGNOSIS — H2511 Age-related nuclear cataract, right eye: Secondary | ICD-10-CM | POA: Diagnosis not present

## 2020-05-04 DIAGNOSIS — H409 Unspecified glaucoma: Secondary | ICD-10-CM | POA: Diagnosis not present

## 2020-05-04 DIAGNOSIS — H401111 Primary open-angle glaucoma, right eye, mild stage: Secondary | ICD-10-CM | POA: Diagnosis not present

## 2020-05-18 DIAGNOSIS — M5412 Radiculopathy, cervical region: Secondary | ICD-10-CM | POA: Diagnosis not present

## 2020-05-18 DIAGNOSIS — M9901 Segmental and somatic dysfunction of cervical region: Secondary | ICD-10-CM | POA: Diagnosis not present

## 2020-05-19 DIAGNOSIS — M9901 Segmental and somatic dysfunction of cervical region: Secondary | ICD-10-CM | POA: Diagnosis not present

## 2020-05-19 DIAGNOSIS — M5412 Radiculopathy, cervical region: Secondary | ICD-10-CM | POA: Diagnosis not present

## 2020-05-20 DIAGNOSIS — M5412 Radiculopathy, cervical region: Secondary | ICD-10-CM | POA: Diagnosis not present

## 2020-05-20 DIAGNOSIS — M9901 Segmental and somatic dysfunction of cervical region: Secondary | ICD-10-CM | POA: Diagnosis not present

## 2020-05-22 DIAGNOSIS — M5412 Radiculopathy, cervical region: Secondary | ICD-10-CM | POA: Diagnosis not present

## 2020-05-22 DIAGNOSIS — M9901 Segmental and somatic dysfunction of cervical region: Secondary | ICD-10-CM | POA: Diagnosis not present

## 2020-05-26 DIAGNOSIS — M5412 Radiculopathy, cervical region: Secondary | ICD-10-CM | POA: Diagnosis not present

## 2020-05-26 DIAGNOSIS — M9901 Segmental and somatic dysfunction of cervical region: Secondary | ICD-10-CM | POA: Diagnosis not present

## 2020-05-29 DIAGNOSIS — M9901 Segmental and somatic dysfunction of cervical region: Secondary | ICD-10-CM | POA: Diagnosis not present

## 2020-05-29 DIAGNOSIS — M5412 Radiculopathy, cervical region: Secondary | ICD-10-CM | POA: Diagnosis not present

## 2020-06-03 DIAGNOSIS — M5412 Radiculopathy, cervical region: Secondary | ICD-10-CM | POA: Diagnosis not present

## 2020-06-03 DIAGNOSIS — M9901 Segmental and somatic dysfunction of cervical region: Secondary | ICD-10-CM | POA: Diagnosis not present

## 2020-06-05 DIAGNOSIS — M9901 Segmental and somatic dysfunction of cervical region: Secondary | ICD-10-CM | POA: Diagnosis not present

## 2020-06-05 DIAGNOSIS — M5412 Radiculopathy, cervical region: Secondary | ICD-10-CM | POA: Diagnosis not present

## 2020-06-09 DIAGNOSIS — M9901 Segmental and somatic dysfunction of cervical region: Secondary | ICD-10-CM | POA: Diagnosis not present

## 2020-06-09 DIAGNOSIS — M5412 Radiculopathy, cervical region: Secondary | ICD-10-CM | POA: Diagnosis not present

## 2020-06-10 DIAGNOSIS — M19049 Primary osteoarthritis, unspecified hand: Secondary | ICD-10-CM | POA: Diagnosis not present

## 2020-06-10 DIAGNOSIS — M1990 Unspecified osteoarthritis, unspecified site: Secondary | ICD-10-CM | POA: Diagnosis not present

## 2020-06-10 DIAGNOSIS — E782 Mixed hyperlipidemia: Secondary | ICD-10-CM | POA: Diagnosis not present

## 2020-06-12 DIAGNOSIS — M5412 Radiculopathy, cervical region: Secondary | ICD-10-CM | POA: Diagnosis not present

## 2020-06-12 DIAGNOSIS — M9901 Segmental and somatic dysfunction of cervical region: Secondary | ICD-10-CM | POA: Diagnosis not present

## 2020-06-16 DIAGNOSIS — M5412 Radiculopathy, cervical region: Secondary | ICD-10-CM | POA: Diagnosis not present

## 2020-06-16 DIAGNOSIS — Z79899 Other long term (current) drug therapy: Secondary | ICD-10-CM | POA: Diagnosis not present

## 2020-06-16 DIAGNOSIS — R7303 Prediabetes: Secondary | ICD-10-CM | POA: Diagnosis not present

## 2020-06-16 DIAGNOSIS — M9901 Segmental and somatic dysfunction of cervical region: Secondary | ICD-10-CM | POA: Diagnosis not present

## 2020-06-16 DIAGNOSIS — M8588 Other specified disorders of bone density and structure, other site: Secondary | ICD-10-CM | POA: Diagnosis not present

## 2020-06-16 DIAGNOSIS — I700 Atherosclerosis of aorta: Secondary | ICD-10-CM | POA: Diagnosis not present

## 2020-06-16 DIAGNOSIS — M19049 Primary osteoarthritis, unspecified hand: Secondary | ICD-10-CM | POA: Diagnosis not present

## 2020-06-16 DIAGNOSIS — E782 Mixed hyperlipidemia: Secondary | ICD-10-CM | POA: Diagnosis not present

## 2020-06-16 DIAGNOSIS — R69 Illness, unspecified: Secondary | ICD-10-CM | POA: Diagnosis not present

## 2020-06-16 DIAGNOSIS — L309 Dermatitis, unspecified: Secondary | ICD-10-CM | POA: Diagnosis not present

## 2020-06-16 DIAGNOSIS — Z0000 Encounter for general adult medical examination without abnormal findings: Secondary | ICD-10-CM | POA: Diagnosis not present

## 2020-06-16 DIAGNOSIS — E669 Obesity, unspecified: Secondary | ICD-10-CM | POA: Diagnosis not present

## 2020-06-23 DIAGNOSIS — M5412 Radiculopathy, cervical region: Secondary | ICD-10-CM | POA: Diagnosis not present

## 2020-06-23 DIAGNOSIS — M9901 Segmental and somatic dysfunction of cervical region: Secondary | ICD-10-CM | POA: Diagnosis not present

## 2020-06-29 DIAGNOSIS — Z1212 Encounter for screening for malignant neoplasm of rectum: Secondary | ICD-10-CM | POA: Diagnosis not present

## 2020-06-29 DIAGNOSIS — Z1211 Encounter for screening for malignant neoplasm of colon: Secondary | ICD-10-CM | POA: Diagnosis not present

## 2020-06-30 DIAGNOSIS — M5412 Radiculopathy, cervical region: Secondary | ICD-10-CM | POA: Diagnosis not present

## 2020-06-30 DIAGNOSIS — M9901 Segmental and somatic dysfunction of cervical region: Secondary | ICD-10-CM | POA: Diagnosis not present

## 2020-07-04 LAB — COLOGUARD: COLOGUARD: NEGATIVE

## 2020-07-14 DIAGNOSIS — M5412 Radiculopathy, cervical region: Secondary | ICD-10-CM | POA: Diagnosis not present

## 2020-07-14 DIAGNOSIS — M9901 Segmental and somatic dysfunction of cervical region: Secondary | ICD-10-CM | POA: Diagnosis not present

## 2020-08-05 DIAGNOSIS — H40053 Ocular hypertension, bilateral: Secondary | ICD-10-CM | POA: Diagnosis not present

## 2020-08-10 DIAGNOSIS — M1990 Unspecified osteoarthritis, unspecified site: Secondary | ICD-10-CM | POA: Diagnosis not present

## 2020-08-10 DIAGNOSIS — E782 Mixed hyperlipidemia: Secondary | ICD-10-CM | POA: Diagnosis not present

## 2020-08-10 DIAGNOSIS — M19049 Primary osteoarthritis, unspecified hand: Secondary | ICD-10-CM | POA: Diagnosis not present

## 2020-10-28 DIAGNOSIS — H40053 Ocular hypertension, bilateral: Secondary | ICD-10-CM | POA: Diagnosis not present

## 2020-11-18 DIAGNOSIS — M1990 Unspecified osteoarthritis, unspecified site: Secondary | ICD-10-CM | POA: Diagnosis not present

## 2020-11-18 DIAGNOSIS — E782 Mixed hyperlipidemia: Secondary | ICD-10-CM | POA: Diagnosis not present

## 2020-11-18 DIAGNOSIS — M19049 Primary osteoarthritis, unspecified hand: Secondary | ICD-10-CM | POA: Diagnosis not present

## 2020-11-24 DIAGNOSIS — L918 Other hypertrophic disorders of the skin: Secondary | ICD-10-CM | POA: Diagnosis not present

## 2020-11-24 DIAGNOSIS — Z85828 Personal history of other malignant neoplasm of skin: Secondary | ICD-10-CM | POA: Diagnosis not present

## 2020-11-24 DIAGNOSIS — L821 Other seborrheic keratosis: Secondary | ICD-10-CM | POA: Diagnosis not present

## 2020-11-24 DIAGNOSIS — L814 Other melanin hyperpigmentation: Secondary | ICD-10-CM | POA: Diagnosis not present

## 2020-11-24 DIAGNOSIS — D1801 Hemangioma of skin and subcutaneous tissue: Secondary | ICD-10-CM | POA: Diagnosis not present

## 2020-12-09 DIAGNOSIS — M2550 Pain in unspecified joint: Secondary | ICD-10-CM | POA: Diagnosis not present

## 2020-12-09 DIAGNOSIS — M19049 Primary osteoarthritis, unspecified hand: Secondary | ICD-10-CM | POA: Diagnosis not present

## 2021-01-04 DIAGNOSIS — J019 Acute sinusitis, unspecified: Secondary | ICD-10-CM | POA: Diagnosis not present

## 2021-01-06 DIAGNOSIS — Z1231 Encounter for screening mammogram for malignant neoplasm of breast: Secondary | ICD-10-CM | POA: Diagnosis not present

## 2021-01-14 DIAGNOSIS — Z23 Encounter for immunization: Secondary | ICD-10-CM | POA: Diagnosis not present

## 2021-01-14 DIAGNOSIS — R69 Illness, unspecified: Secondary | ICD-10-CM | POA: Diagnosis not present

## 2021-01-14 DIAGNOSIS — E669 Obesity, unspecified: Secondary | ICD-10-CM | POA: Diagnosis not present

## 2021-01-14 DIAGNOSIS — M549 Dorsalgia, unspecified: Secondary | ICD-10-CM | POA: Diagnosis not present

## 2021-02-23 DIAGNOSIS — M5030 Other cervical disc degeneration, unspecified cervical region: Secondary | ICD-10-CM | POA: Insufficient documentation

## 2021-02-23 DIAGNOSIS — M5416 Radiculopathy, lumbar region: Secondary | ICD-10-CM | POA: Diagnosis not present

## 2021-03-01 DIAGNOSIS — G8929 Other chronic pain: Secondary | ICD-10-CM | POA: Diagnosis not present

## 2021-03-01 DIAGNOSIS — M542 Cervicalgia: Secondary | ICD-10-CM | POA: Diagnosis not present

## 2021-03-01 DIAGNOSIS — M545 Low back pain, unspecified: Secondary | ICD-10-CM | POA: Diagnosis not present

## 2021-03-01 DIAGNOSIS — E669 Obesity, unspecified: Secondary | ICD-10-CM | POA: Diagnosis not present

## 2021-03-01 DIAGNOSIS — Z6834 Body mass index (BMI) 34.0-34.9, adult: Secondary | ICD-10-CM | POA: Diagnosis not present

## 2021-03-01 DIAGNOSIS — M79642 Pain in left hand: Secondary | ICD-10-CM | POA: Diagnosis not present

## 2021-03-03 DIAGNOSIS — M542 Cervicalgia: Secondary | ICD-10-CM | POA: Diagnosis not present

## 2021-03-10 DIAGNOSIS — M542 Cervicalgia: Secondary | ICD-10-CM | POA: Diagnosis not present

## 2021-03-17 DIAGNOSIS — H52223 Regular astigmatism, bilateral: Secondary | ICD-10-CM | POA: Diagnosis not present

## 2021-03-17 DIAGNOSIS — M542 Cervicalgia: Secondary | ICD-10-CM | POA: Diagnosis not present

## 2021-03-30 DIAGNOSIS — Z6834 Body mass index (BMI) 34.0-34.9, adult: Secondary | ICD-10-CM | POA: Diagnosis not present

## 2021-03-30 DIAGNOSIS — Z1589 Genetic susceptibility to other disease: Secondary | ICD-10-CM | POA: Diagnosis not present

## 2021-03-30 DIAGNOSIS — M545 Low back pain, unspecified: Secondary | ICD-10-CM | POA: Diagnosis not present

## 2021-03-30 DIAGNOSIS — M542 Cervicalgia: Secondary | ICD-10-CM | POA: Diagnosis not present

## 2021-03-30 DIAGNOSIS — M79642 Pain in left hand: Secondary | ICD-10-CM | POA: Diagnosis not present

## 2021-03-30 DIAGNOSIS — E669 Obesity, unspecified: Secondary | ICD-10-CM | POA: Diagnosis not present

## 2021-04-07 DIAGNOSIS — M542 Cervicalgia: Secondary | ICD-10-CM | POA: Diagnosis not present

## 2021-04-12 DIAGNOSIS — M542 Cervicalgia: Secondary | ICD-10-CM | POA: Insufficient documentation

## 2021-06-23 DIAGNOSIS — R7303 Prediabetes: Secondary | ICD-10-CM | POA: Diagnosis not present

## 2021-06-23 DIAGNOSIS — Z79899 Other long term (current) drug therapy: Secondary | ICD-10-CM | POA: Diagnosis not present

## 2021-06-23 DIAGNOSIS — E782 Mixed hyperlipidemia: Secondary | ICD-10-CM | POA: Diagnosis not present

## 2021-06-30 ENCOUNTER — Other Ambulatory Visit: Payer: Self-pay | Admitting: Family Medicine

## 2021-06-30 DIAGNOSIS — E782 Mixed hyperlipidemia: Secondary | ICD-10-CM

## 2021-06-30 DIAGNOSIS — Z0000 Encounter for general adult medical examination without abnormal findings: Secondary | ICD-10-CM | POA: Diagnosis not present

## 2021-06-30 DIAGNOSIS — F411 Generalized anxiety disorder: Secondary | ICD-10-CM | POA: Diagnosis not present

## 2021-06-30 DIAGNOSIS — Z79899 Other long term (current) drug therapy: Secondary | ICD-10-CM | POA: Diagnosis not present

## 2021-06-30 DIAGNOSIS — M19049 Primary osteoarthritis, unspecified hand: Secondary | ICD-10-CM | POA: Diagnosis not present

## 2021-06-30 DIAGNOSIS — R7303 Prediabetes: Secondary | ICD-10-CM | POA: Diagnosis not present

## 2021-06-30 DIAGNOSIS — I700 Atherosclerosis of aorta: Secondary | ICD-10-CM | POA: Diagnosis not present

## 2021-07-27 DIAGNOSIS — H409 Unspecified glaucoma: Secondary | ICD-10-CM | POA: Insufficient documentation

## 2021-07-30 ENCOUNTER — Encounter: Payer: Self-pay | Admitting: Obstetrics

## 2021-07-30 ENCOUNTER — Ambulatory Visit (INDEPENDENT_AMBULATORY_CARE_PROVIDER_SITE_OTHER): Payer: No Typology Code available for payment source | Admitting: Obstetrics

## 2021-07-30 ENCOUNTER — Other Ambulatory Visit (HOSPITAL_COMMUNITY)
Admission: RE | Admit: 2021-07-30 | Discharge: 2021-07-30 | Disposition: A | Discharge status: Home / Self Care | Payer: No Typology Code available for payment source | Source: Ambulatory Visit | Attending: Obstetrics & Gynecology | Admitting: Obstetrics & Gynecology

## 2021-07-30 VITALS — BP 144/74 | HR 68 | Ht 62.5 in | Wt 181.5 lb

## 2021-07-30 DIAGNOSIS — Z780 Asymptomatic menopausal state: Secondary | ICD-10-CM

## 2021-07-30 DIAGNOSIS — Z1151 Encounter for screening for human papillomavirus (HPV): Secondary | ICD-10-CM | POA: Insufficient documentation

## 2021-07-30 DIAGNOSIS — Z01419 Encounter for gynecological examination (general) (routine) without abnormal findings: Secondary | ICD-10-CM | POA: Insufficient documentation

## 2021-07-30 NOTE — Progress Notes (Signed)
? ?Subjective: ? ? ?  ?  ? Priscilla Taylor is a 76 y.o. female here for a routine exam.  Current complaints: Feels like something coming out of vagina.  Denies vaginal discharge, bleeding or pelvic pain.  Denies dysuria or leaking of urine..   ? ?Personal health questionnaire:  ?Is patient Ashkenazi Jewish, have a family history of breast and/or ovarian cancer: no ?Is there a family history of uterine cancer diagnosed at age < 11, gastrointestinal cancer, urinary tract cancer, family member who is a Field seismologist syndrome-associated carrier: no ?Is the patient overweight and hypertensive, family history of diabetes, personal history of gestational diabetes, preeclampsia or PCOS: no ?Is patient over 61, have PCOS,  family history of premature CHD under age 23, diabetes, smoke, have hypertension or peripheral artery disease:  no ?At any time, has a partner hit, kicked or otherwise hurt or frightened you?: no ?Over the past 2 weeks, have you felt down, depressed or hopeless?: no ?Over the past 2 weeks, have you felt little interest or pleasure in doing things?:no ? ? ?Gynecologic History ?No LMP recorded. Patient is postmenopausal. ?Contraception: post menopausal status ?Last Pap: 2 yrs ago. Results were: normal ?Last mammogram: 2023. Results were: normal ? ?Obstetric History ?OB History  ?Gravida Para Term Preterm AB Living  ?1 1 1     1   ?SAB IAB Ectopic Multiple Live Births  ?        1  ?  ?# Outcome Date GA Lbr Len/2nd Weight Sex Delivery Anes PTL Lv  ?1 Term 09/20/73    F CS-LTranv Spinal  LIV  ? ? ?Past Medical History:  ?Diagnosis Date  ? Arthritis   ? Chronic kidney disease   ? x 2  ? Eczema   ? Frequency of urination   ? at bedtime  ? GERD (gastroesophageal reflux disease)   ? Hyperlipidemia   ? Pneumonia   ? hx of years ago  ? Reflux   ?  ?Past Surgical History:  ?Procedure Laterality Date  ? APPENDECTOMY    ? BREAST BIOPSY Left 1986  ? CESAREAN SECTION    ? JOINT REPLACEMENT Left 2004  ? knee  ? TOTAL KNEE  ARTHROPLASTY Right 06/11/2012  ? Procedure: TOTAL KNEE ARTHROPLASTY;  Surgeon: Vickey Huger, MD;  Location: [facility];  Service: Orthopedics;  Laterality: Right;  ? TOTAL KNEE ARTHROPLASTY Right 06/11/2012  ? Dr Ronnie Derby  ?  ? ?Current Outpatient Medications:  ?  ALPRAZolam (XANAX) 0.25 MG tablet, alprazolam 0.25 mg tablet, Disp: , Rfl:  ?  Cholecalciferol (VITAMIN D3) 125 MCG (5000 UT) TABS, See admin instructions., Disp: , Rfl:  ?  citalopram (CELEXA) 20 MG tablet, Take 20 mg by mouth daily., Disp: , Rfl:  ?  dorzolamide (TRUSOPT) 2 % ophthalmic solution, dorzolamide 2 % eye drops  INSTILL 1 DROP INTO AFFECTED EYE EVERY 12 HOURS, Disp: , Rfl:  ?  DORZOLAMIDE HCL OP, , Disp: , Rfl:  ?  enoxaparin (LOVENOX) 40 MG/0.4ML injection, Inject 0.4 mLs (40 mg total) into the skin daily., Disp: 12 Syringe, Rfl: 0 ?  latanoprost (XALATAN) 0.005 % ophthalmic solution, Place 1 drop into both eyes daily., Disp: , Rfl:  ?  Multiple Vitamin (MULTIVITAMIN) tablet, Take 1 tablet by mouth daily., Disp: , Rfl:  ?  Multiple Vitamins-Minerals (PRESERVISION AREDS 2) CAPS, See admin instructions., Disp: , Rfl:  ?  niacin 500 MG tablet, Take 500 mg by mouth 4 (four) times daily. , Disp: , Rfl:  ?  rosuvastatin (CRESTOR) 20 MG tablet, Take 20 mg by mouth daily., Disp: , Rfl:  ?  Turmeric 500 MG CAPS, See admin instructions., Disp: , Rfl:  ?  enoxaparin (LOVENOX) 30 MG/0.3ML injection, Inject 0.3 mLs (30 mg total) into the skin every 12 (twelve) hours., Disp: 12 Syringe, Rfl: 0 ?  methocarbamol (ROBAXIN) 500 MG tablet, Take 1 tablet (500 mg total) by mouth every 6 (six) hours as needed., Disp: 60 tablet, Rfl: 0 ?  omeprazole (PRILOSEC) 20 MG capsule, Take 20 mg by mouth daily., Disp: , Rfl:  ?  oxyCODONE (OXY IR/ROXICODONE) 5 MG immediate release tablet, Take 1-2 tablets (5-10 mg total) by mouth every 3 (three) hours as needed. (Patient not taking: Reported on 07/30/2021), Disp: 90 tablet, Rfl: 0 ?  OxyCODONE (OXYCONTIN) 10 mg T12A, Take 1 tablet (10 mg  total) by mouth every 12 (twelve) hours., Disp: 30 tablet, Rfl: 0 ?Allergies  ?Allergen Reactions  ? Elemental Sulfur Hives  ? Amoxicillin Other (See Comments)  ?  vaginitis  ?  ?Social History  ? ?Tobacco Use  ? Smoking status: Never  ? Smokeless tobacco: Never  ?Substance Use Topics  ? Alcohol use: Yes  ?  Alcohol/week: 1.0 standard drink  ?  Types: 1 Glasses of wine per week  ?  Comment: rare  ?  ?No family history on file.  ? ? ?Review of Systems ? ?Constitutional: negative for fatigue and weight loss ?Respiratory: negative for cough and wheezing ?Cardiovascular: negative for chest pain, fatigue and palpitations ?Gastrointestinal: negative for abdominal pain and change in bowel habits ?Musculoskeletal:negative for myalgias ?Neurological: negative for gait problems and tremors ?Behavioral/Psych: negative for abusive relationship, depression ?Endocrine: negative for temperature intolerance    ?Genitourinary: positive for feeling like something is bulging from vagina.  negative for abnormal menstrual periods, genital lesions, hot flashes, sexual problems and vaginal discharge ?Integument/breast: negative for breast lump, breast tenderness, nipple discharge and skin lesion(s) ? ?  ?Objective:  ? ?    ?BP (!) 144/74   Pulse 68   Ht 5' 2.5" (1.588 m)   Wt 181 lb 8 oz (82.3 kg)   BMI 32.67 kg/m?  ?General:   Alert and no distress  ?Skin:   no rash or abnormalities  ?Lungs:   clear to auscultation bilaterally  ?Heart:   regular rate and rhythm, S1, S2 normal, no murmur, click, rub or gallop  ?Breasts:   normal without suspicious masses, skin or nipple changes or axillary nodes  ?Abdomen:  normal findings: no organomegaly, soft, non-tender and no hernia  ?Pelvis:  External genitalia: normal general appearance ?Urinary system: urethral meatus normal and bladder without fullness, nontender ?Vaginal: normal without tenderness, induration or masses ?Cervix: normal appearance ?Adnexa: normal bimanual exam ?Uterus:  anteverted and non-tender, normal size  ? ?Lab Review ?Urine pregnancy test ?Labs reviewed yes ?Radiologic studies reviewed yes ? ?I have spent a total of 20 minutes of face-to-face time, excluding clinical staff time, reviewing notes and preparing to see patient, ordering tests and/or medications, and counseling the patient.  ? ?Assessment:  ? ? 1. Encounter for well woman exam with routine gynecological exam ?- normal exam ?Rx: ?- Cytology - PAP ? ?2. Postmenopause ?- doing well ?  ?  ?Plan:  ? ? Education reviewed: calcium supplements, depression evaluation, low fat, low cholesterol diet, safe sex/STD prevention, self breast exams, skin cancer screening, and weight bearing exercise. ?Follow up in: 2 years.  ? ? ? Shelly Bombard, MD ?07/30/2021 8:46 AM  ?

## 2021-08-02 ENCOUNTER — Ambulatory Visit
Admission: RE | Admit: 2021-08-02 | Discharge: 2021-08-02 | Disposition: A | Discharge status: Home / Self Care | Payer: No Typology Code available for payment source | Source: Ambulatory Visit | Attending: Family Medicine | Admitting: Family Medicine

## 2021-08-02 DIAGNOSIS — E782 Mixed hyperlipidemia: Secondary | ICD-10-CM

## 2021-08-04 LAB — CYTOLOGY - PAP
Adequacy: ABSENT
Comment: NEGATIVE
Diagnosis: NEGATIVE
High risk HPV: NEGATIVE

## 2021-08-06 ENCOUNTER — Other Ambulatory Visit: Payer: Self-pay | Admitting: Family Medicine

## 2021-08-06 DIAGNOSIS — R9389 Abnormal findings on diagnostic imaging of other specified body structures: Secondary | ICD-10-CM

## 2021-08-24 ENCOUNTER — Ambulatory Visit: Payer: No Typology Code available for payment source | Admitting: Obstetrics & Gynecology

## 2021-08-31 ENCOUNTER — Other Ambulatory Visit: Payer: No Typology Code available for payment source

## 2021-09-23 ENCOUNTER — Ambulatory Visit
Admission: RE | Admit: 2021-09-23 | Discharge: 2021-09-23 | Disposition: A | Discharge status: Home / Self Care | Payer: No Typology Code available for payment source | Source: Ambulatory Visit | Attending: Family Medicine | Admitting: Family Medicine

## 2021-09-23 ENCOUNTER — Other Ambulatory Visit: Payer: No Typology Code available for payment source

## 2021-09-23 DIAGNOSIS — R9389 Abnormal findings on diagnostic imaging of other specified body structures: Secondary | ICD-10-CM

## 2021-09-23 DIAGNOSIS — K760 Fatty (change of) liver, not elsewhere classified: Secondary | ICD-10-CM | POA: Diagnosis not present

## 2021-09-23 DIAGNOSIS — R918 Other nonspecific abnormal finding of lung field: Secondary | ICD-10-CM | POA: Diagnosis not present

## 2021-09-23 DIAGNOSIS — I2510 Atherosclerotic heart disease of native coronary artery without angina pectoris: Secondary | ICD-10-CM | POA: Diagnosis not present

## 2021-09-23 DIAGNOSIS — J984 Other disorders of lung: Secondary | ICD-10-CM | POA: Diagnosis not present

## 2021-09-23 MED ORDER — IOPAMIDOL (ISOVUE-370) INJECTION 76%
75.0000 mL | Freq: Once | INTRAVENOUS | Status: AC | PRN
  Administered 2021-09-23: 75 mL via INTRAVENOUS

## 2021-10-06 ENCOUNTER — Other Ambulatory Visit: Payer: Self-pay | Admitting: Family Medicine

## 2021-10-06 ENCOUNTER — Other Ambulatory Visit (HOSPITAL_COMMUNITY): Payer: Self-pay | Admitting: Family Medicine

## 2021-10-06 DIAGNOSIS — R911 Solitary pulmonary nodule: Secondary | ICD-10-CM

## 2021-10-07 NOTE — Progress Notes (Signed)
ID:  Priscilla Taylor, DOB 1945/06/01, MRN [id number]  PCP:  Lawerance Cruel, MD  Cardiologist:  Rex Kras, DO, [facility] (established care 10/08/2021)  REASON FOR CONSULT: Coronary artery atherosclerosis.   REQUESTING PHYSICIAN:  Lawerance Cruel, [place],  [place] [address]  Chief Complaint  Patient presents with   New Patient (Initial Visit)   abnormal ct    HPI  Priscilla Taylor is a 76 y.o. Caucasian female who presents to the clinic for evaluation of coronary artery calcification at the request of Lawerance Cruel, MD. Her past medical history and cardiovascular risk factors include: Prediabetes, hyperlipidemia, aortic atherosclerosis, moderate coronary calcification (total CAC 118.5 AU, 66th percentile as of May 2023).  Patient presents to the office to discuss her recent coronary artery calcium score which was performed under the guidance of her PCP.  She is noted to have moderate coronary artery calcification with a total score of 118.5 AU placing her at the 66 percentile.  After these results her statin therapy was uptitrated and she requested to see a cardiologist.  Clinically she denies anginal discomfort or heart failure symptoms.  No change in overall physical endurance.   FUNCTIONAL STATUS: Walks 1 mile at least 3 days a week.  Remainder of the day she goes to the [facility] and does water aerobics for approximately 2 hours at a time for 2 days of the week.  ALLERGIES: Allergies  Allergen Reactions   Elemental Sulfur Hives   Amoxicillin Other (See Comments)    vaginitis   Lipitor [Atorvastatin] Other (See Comments)    ache   Zocor [Simvastatin] Other (See Comments)    ache    MEDICATION LIST PRIOR TO VISIT: Current Meds  Medication Sig   ALPRAZolam (XANAX) 0.25 MG tablet alprazolam 0.25 mg tablet   aspirin EC 81 MG tablet Take 1 tablet (81 mg total) by mouth daily. Swallow whole.   Biotin 5000 MCG CAPS Take by mouth.    Cholecalciferol (VITAMIN D3) 50 MCG (2000 UT) capsule Take 1 capsule by mouth daily.   citalopram (CELEXA) 20 MG tablet Take 20 mg by mouth daily.   Coenzyme Q10 (COQ-10) 100 MG CAPS Take 1 capsule by mouth daily.   DORZOLAMIDE HCL OP    latanoprost (XALATAN) 0.005 % ophthalmic solution 1 drop into affected eye in the evening   Magnesium 100 MG TABS Take 1 tablet by mouth daily.   meloxicam (MOBIC) 15 MG tablet Take 15 mg by mouth daily.   Multiple Vitamin (MULTIVITAMIN) tablet Take 1 tablet by mouth daily.   Multiple Vitamins-Minerals (PRESERVISION AREDS 2) CAPS See admin instructions.   niacin 500 MG tablet Take 500 mg by mouth 4 (four) times daily.    Probiotic Product (PROBIOTIC-10 PO) Take by mouth.   rosuvastatin (CRESTOR) 20 MG tablet Take 20 mg by mouth daily.   timolol (TIMOPTIC) 0.5 % ophthalmic solution Place 1 drop into both eyes daily.   Turmeric 500 MG CAPS See admin instructions.   [DISCONTINUED] dorzolamide (TRUSOPT) 2 % ophthalmic solution dorzolamide 2 % eye drops  INSTILL 1 DROP INTO AFFECTED EYE EVERY 12 HOURS   [DISCONTINUED] latanoprost (XALATAN) 0.005 % ophthalmic solution Place 1 drop into both eyes daily.     PAST MEDICAL HISTORY: Past Medical History:  Diagnosis Date   Arthritis    Chronic kidney disease    x 2   Coronary atherosclerosis due to calcified coronary lesion    Eczema    Frequency  of urination    at bedtime   GERD (gastroesophageal reflux disease)    Hyperlipidemia    Pneumonia    hx of years ago   Reflux     PAST SURGICAL HISTORY: Past Surgical History:  Procedure Laterality Date   APPENDECTOMY     BREAST BIOPSY Left 03/28/1984   CATARACT EXTRACTION Bilateral    CESAREAN SECTION     JOINT REPLACEMENT Left 03/28/2002   knee   TOTAL KNEE ARTHROPLASTY Right 06/11/2012   Procedure: TOTAL KNEE ARTHROPLASTY;  Surgeon: Vickey Huger, MD;  Location: [place];  Service: Orthopedics;  Laterality: Right;   TOTAL KNEE ARTHROPLASTY Right 06/11/2012    Dr Ronnie Derby    FAMILY HISTORY: The patient family history includes Alcoholism in her mother; Heart attack in her father; Heart disease in her father and mother.  SOCIAL HISTORY:  The patient  reports that she has never smoked. She has never used smokeless tobacco. She reports current alcohol use of about 1.0 standard drink of alcohol per week. She reports that she does not use drugs.  REVIEW OF SYSTEMS: Review of Systems  Cardiovascular:  Negative for chest pain, claudication, dyspnea on exertion, irregular heartbeat, leg swelling, near-syncope, orthopnea, palpitations, paroxysmal nocturnal dyspnea and syncope.  Respiratory:  Negative for shortness of breath.   Hematologic/Lymphatic: Negative for bleeding problem.  Musculoskeletal:  Negative for muscle cramps and myalgias.  Neurological:  Negative for dizziness and light-headedness.    PHYSICAL EXAM:    10/08/2021    9:20 AM 10/08/2021    9:07 AM 07/30/2021    8:16 AM  Vitals with BMI  Height  5' 2.5" 5' 2.5"  Weight  185 lbs 181 lbs 8 oz  BMI  82.95 62.13  Systolic 086 578 469  Diastolic 86 77 74  Pulse 68 69 68    Physical Exam  Constitutional:  Age appropriate, hemodynamically stable, no acute distress.   Neck: No JVD present.  Cardiovascular: Normal rate, regular rhythm, S1 normal and S2 normal. Exam reveals no gallop and no friction rub.  No murmur heard. Pulses:      Dorsalis pedis pulses are 2+ on the right side and 2+ on the left side.       Posterior tibial pulses are 2+ on the right side and 2+ on the left side.  Pulmonary/Chest: Breath sounds normal. She has no wheezes. She has no rales. She exhibits no tenderness.  Abdominal: Soft. Bowel sounds are normal. She exhibits no distension. There is no abdominal tenderness.  Musculoskeletal:        General: No tenderness or edema.  Neurological: She is alert and oriented to person, place, and time.  Skin: Skin is warm and dry.   CARDIAC DATABASE: EKG: 10/08/2021:  NSR, 70 bpm, without underlying ischemia injury pattern.  Echocardiogram: No results found for this or any previous visit from the past 1095 days.    Stress Testing: No results found for this or any previous visit from the past 1095 days.   Heart Catheterization: None  Carotid duplex: 12/05/2014 Mild (1-49%) stenosis in the proximal left ICA secondary to smooth but hypoechoic lipid rich atherosclerotic plaque.  Mild intimal thickening of the right without evidence of ICA stenosis. Vertebral arteries are patent and antegrade flow.  Coronary artery calcium score: 08/03/2021: Left Main: 0   LAD: 118   LCx: 0.5   RCA: 0   Total Agatston Score: 118.5   MESA database percentile: 66   AORTA MEASUREMENTS:   Ascending Aorta:  27 mm   Descending Aorta: 21 mm  Incidental detection of a mass in the left upper lobe measuring approximately 1.7 x 1.1 x 1.4 cm. This is potentially representative of a primary lung carcinoma. Further evaluation is recommended by a full CT of the chest with IV contrast. Additional multi-disciplinary thoracic oncologic evaluation recommended. The nodule is near a branch point of the anterior left upper lobe bronchus and may be approachable by bronchoscopy.   LABORATORY DATA:    Latest Ref Rng & Units 06/13/2012    4:55 AM 06/12/2012    5:55 AM 06/11/2012    5:04 PM  CBC  WBC 4.0 - 10.5 K/uL 11.9  12.2  16.7   Hemoglobin 12.0 - 15.0 g/dL 10.1  10.6  12.3   Hematocrit 36.0 - 46.0 % 29.6  31.4  35.9   Platelets 150 - 400 K/uL 221  256  297        Latest Ref Rng & Units 06/13/2012    4:55 AM 06/12/2012    5:55 AM 06/11/2012    5:04 PM  CMP  Glucose 70 - 99 mg/dL 146  133    BUN 6 - 23 mg/dL 11  9    Creatinine 0.50 - 1.10 mg/dL 0.60  0.56  0.47   Sodium 135 - 145 mEq/L 138  138    Potassium 3.5 - 5.1 mEq/L 4.2  4.6    Chloride 96 - 112 mEq/L 103  103    CO2 19 - 32 mEq/L 25  25    Calcium 8.4 - 10.5 mg/dL 8.7  8.8      Lipid Panel  No  results found for: "CHOL", "TRIG", "HDL", "CHOLHDL", "VLDL", "LDLCALC", "LDLDIRECT", "LABVLDL"  No components found for: "NTPROBNP" No results for input(s): "PROBNP" in the last 8760 hours. No results for input(s): "TSH" in the last 8760 hours.  BMP No results for input(s): "NA", "K", "CL", "CO2", "GLUCOSE", "BUN", "CREATININE", "CALCIUM", "GFRNONAA", "GFRAA" in the last 8760 hours.  HEMOGLOBIN A1C No results found for: "HGBA1C", "MPG"  External Labs:  Date Collected: 06/23/2021 , information obtained by referring physician Potassium: 4.6 BUN 19, creatinine 0.68 mg/dL. eGFR: 91 mL/min per 1.73 m Lipid profile: Total cholesterol 153 , triglycerides 176 , HDL 61 , LDL 63, non-HDL 92 AST: 27 , ALT: 29 , alkaline phosphatase: 70  Hemoglobin A1c: 5.8  IMPRESSION:    ICD-10-CM   1. Total CAC 118.5, 66 percentile as of May 2023  R93.1 EKG 12-Lead    aspirin EC 81 MG tablet    PCV ECHOCARDIOGRAM COMPLETE    PCV MYOCARDIAL PERFUSION WITH LEXISCAN    2. Coronary atherosclerosis due to calcified coronary lesion  I25.10 aspirin EC 81 MG tablet   I25.84 PCV ECHOCARDIOGRAM COMPLETE    PCV MYOCARDIAL PERFUSION WITH LEXISCAN    3. Atherosclerosis of aorta (HCC)  I70.0 aspirin EC 81 MG tablet    PCV ECHOCARDIOGRAM COMPLETE    PCV MYOCARDIAL PERFUSION WITH LEXISCAN    4. Prediabetes  R73.03     5. Mixed hyperlipidemia  E78.2        RECOMMENDATIONS: Priscilla Taylor is a 76 y.o. Caucasian female whose past medical history and cardiac risk factors include: Prediabetes, hyperlipidemia, aortic atherosclerosis, moderate coronary calcification (total CAC 118.5 AU, 66th percentile as of May 2023).  Total CAC 118.5, 66 percentile as of May 2023 / Coronary atherosclerosis due to calcified coronary lesion Recommend aspirin 81 mg p.o. daily, as long as she is low  risk for bleeding.  In the past she has had GI upset but is willing to retry the medication.  I have asked her to take it after meals  and not empty stomach. PCP has uptitrated statin therapy, agree. Echo will be ordered to evaluate for structural heart disease and left ventricular systolic function. Plan exercise nuclear stress test to evaluate for functional status and reversible ischemia. Further recommendations to follow  Prediabetes Reeducated on importance of glycemic control. Currently managed by primary care provider.  Mixed hyperlipidemia Currently on rosuvastatin.   She denies myalgia or other side effects. Most recent lipids dated March 2023, independently reviewed as noted above. LDL currently at goal, <70 mg/dL. Currently managed by primary care provider.  Patient's blood pressures are elevated at today's office visit likely secondary to anxiety of meeting a new provider.  I have asked her to keep a log of her blood pressures and to either review it with myself or PCP to see if pharmacological therapy is warranted.  Reemphasized importance of reducing foods that are high in triglyceride levels.  Would recommend a triglyceride level less than 149 mg/dL.  During her recent coronary calcium score she was noted to have pulmonary findings as well.  She is undergoing a PET scan.  I have asked her to get the exercise nuclear stress test 2 weeks after her PET scan as they both are nuclear med studies.  Patient verbalized understanding  I will see her back in the office once the diagnostic work-up is complete.  Data Reviewed: I have independently reviewed external notes provided by the referring provider as part of this office visit.   I have independently reviewed results of EKG, labs provided by PCP, coronary calcium report as part of medical decision making. I have ordered the following tests:  Orders Placed This Encounter  Procedures   PCV MYOCARDIAL PERFUSION WITH LEXISCAN    Standing Status:   Future    Standing Expiration Date:   10/09/2022   EKG 12-Lead   PCV ECHOCARDIOGRAM COMPLETE    Standing Status:    Future    Standing Expiration Date:   10/09/2022   I have made medications changes at today's encounter as noted above.  FINAL MEDICATION LIST END OF ENCOUNTER: Meds ordered this encounter  Medications   aspirin EC 81 MG tablet    Sig: Take 1 tablet (81 mg total) by mouth daily. Swallow whole.    Dispense:  30 tablet    Refill:  12    Medications Discontinued During This Encounter  Medication Reason   Cholecalciferol (VITAMIN D3) 125 MCG (5000 UT) TABS Change in therapy   latanoprost (XALATAN) 0.005 % ophthalmic solution Change in therapy   dorzolamide (TRUSOPT) 2 % ophthalmic solution Change in therapy   dorzolamide (TRUSOPT) 2 % ophthalmic solution Change in therapy   enoxaparin (LOVENOX) 30 MG/0.3ML injection Patient Preference   enoxaparin (LOVENOX) 40 MG/0.4ML injection Patient Preference   methocarbamol (ROBAXIN) 500 MG tablet Patient Preference   omeprazole (PRILOSEC) 20 MG capsule Patient Preference   oxyCODONE (OXY IR/ROXICODONE) 5 MG immediate release tablet Patient Preference   OxyCODONE (OXYCONTIN) 10 mg T12A Patient Preference     Current Outpatient Medications:    ALPRAZolam (XANAX) 0.25 MG tablet, alprazolam 0.25 mg tablet, Disp: , Rfl:    aspirin EC 81 MG tablet, Take 1 tablet (81 mg total) by mouth daily. Swallow whole., Disp: 30 tablet, Rfl: 12   Biotin 5000 MCG CAPS, Take by mouth., Disp: , Rfl:  Cholecalciferol (VITAMIN D3) 50 MCG (2000 UT) capsule, Take 1 capsule by mouth daily., Disp: , Rfl:    citalopram (CELEXA) 20 MG tablet, Take 20 mg by mouth daily., Disp: , Rfl:    Coenzyme Q10 (COQ-10) 100 MG CAPS, Take 1 capsule by mouth daily., Disp: , Rfl:    DORZOLAMIDE HCL OP, , Disp: , Rfl:    latanoprost (XALATAN) 0.005 % ophthalmic solution, 1 drop into affected eye in the evening, Disp: , Rfl:    Magnesium 100 MG TABS, Take 1 tablet by mouth daily., Disp: , Rfl:    meloxicam (MOBIC) 15 MG tablet, Take 15 mg by mouth daily., Disp: , Rfl:    Multiple  Vitamin (MULTIVITAMIN) tablet, Take 1 tablet by mouth daily., Disp: , Rfl:    Multiple Vitamins-Minerals (PRESERVISION AREDS 2) CAPS, See admin instructions., Disp: , Rfl:    niacin 500 MG tablet, Take 500 mg by mouth 4 (four) times daily. , Disp: , Rfl:    Probiotic Product (PROBIOTIC-10 PO), Take by mouth., Disp: , Rfl:    rosuvastatin (CRESTOR) 20 MG tablet, Take 20 mg by mouth daily., Disp: , Rfl:    timolol (TIMOPTIC) 0.5 % ophthalmic solution, Place 1 drop into both eyes daily., Disp: , Rfl:    Turmeric 500 MG CAPS, See admin instructions., Disp: , Rfl:   Orders Placed This Encounter  Procedures   PCV MYOCARDIAL PERFUSION WITH LEXISCAN   EKG 12-Lead   PCV ECHOCARDIOGRAM COMPLETE    There are no Patient Instructions on file for this visit.   --Continue cardiac medications as reconciled in final medication list. --Return in about 6 weeks (around 11/19/2021) for Follow up, Coronary artery calcification. or sooner if needed. --Continue follow-up with your primary care physician regarding the management of your other chronic comorbid conditions.  Patient's questions and concerns were addressed to her satisfaction. She voices understanding of the instructions provided during this encounter.   This note was created using a voice recognition software as a result there may be grammatical errors inadvertently enclosed that do not reflect the nature of this encounter. Every attempt is made to correct such errors.  Rex Kras, [place], [facility]  Pager: [phone] Office: [phone]

## 2021-10-08 ENCOUNTER — Encounter: Payer: Self-pay | Admitting: Cardiology

## 2021-10-08 ENCOUNTER — Ambulatory Visit: Payer: No Typology Code available for payment source | Admitting: Cardiology

## 2021-10-08 VITALS — BP 141/86 | HR 68 | Temp 98.0°F | Resp 17 | Ht 62.5 in | Wt 185.0 lb

## 2021-10-08 DIAGNOSIS — R7303 Prediabetes: Secondary | ICD-10-CM

## 2021-10-08 DIAGNOSIS — R931 Abnormal findings on diagnostic imaging of heart and coronary circulation: Secondary | ICD-10-CM

## 2021-10-08 DIAGNOSIS — I700 Atherosclerosis of aorta: Secondary | ICD-10-CM

## 2021-10-08 DIAGNOSIS — E782 Mixed hyperlipidemia: Secondary | ICD-10-CM | POA: Diagnosis not present

## 2021-10-08 DIAGNOSIS — I2510 Atherosclerotic heart disease of native coronary artery without angina pectoris: Secondary | ICD-10-CM | POA: Diagnosis not present

## 2021-10-08 DIAGNOSIS — I2584 Coronary atherosclerosis due to calcified coronary lesion: Secondary | ICD-10-CM | POA: Diagnosis not present

## 2021-10-08 MED ORDER — ASPIRIN 81 MG PO TBEC: 81.0000 mg | DELAYED_RELEASE_TABLET | Freq: Every day | ORAL | 12 refills | Status: DC

## 2021-10-20 ENCOUNTER — Ambulatory Visit (HOSPITAL_COMMUNITY)
Admission: RE | Admit: 2021-10-20 | Discharge: 2021-10-20 | Disposition: A | Discharge status: Home / Self Care | Payer: No Typology Code available for payment source | Source: Ambulatory Visit | Attending: Family Medicine | Admitting: Family Medicine

## 2021-10-20 DIAGNOSIS — R911 Solitary pulmonary nodule: Secondary | ICD-10-CM | POA: Insufficient documentation

## 2021-10-20 LAB — GLUCOSE, CAPILLARY: Glucose-Capillary: 102 mg/dL — ABNORMAL HIGH (ref 70–99)

## 2021-10-20 MED ORDER — FLUDEOXYGLUCOSE F - 18 (FDG) INJECTION
9.5000 | Freq: Once | INTRAVENOUS | Status: AC
  Administered 2021-10-20: 9.2 via INTRAVENOUS

## 2021-10-25 ENCOUNTER — Telehealth: Payer: Self-pay | Admitting: Internal Medicine

## 2021-10-25 NOTE — Telephone Encounter (Signed)
Scheduled appt per 7/27 referral. Pt is aware of appt date and time. Pt is aware to arrive 15 mins prior to appt time and to bring and updated insurance card. Pt is aware of appt location.

## 2021-10-29 ENCOUNTER — Other Ambulatory Visit: Payer: Self-pay

## 2021-10-29 DIAGNOSIS — R911 Solitary pulmonary nodule: Secondary | ICD-10-CM

## 2021-11-04 ENCOUNTER — Encounter: Payer: Self-pay | Admitting: Internal Medicine

## 2021-11-04 ENCOUNTER — Inpatient Hospital Stay: Payer: No Typology Code available for payment source

## 2021-11-04 ENCOUNTER — Inpatient Hospital Stay: Payer: No Typology Code available for payment source | Attending: Internal Medicine | Admitting: Internal Medicine

## 2021-11-04 VITALS — BP 140/52 | HR 68 | Temp 98.0°F | Resp 18 | Ht 62.0 in | Wt 187.0 lb

## 2021-11-04 DIAGNOSIS — R911 Solitary pulmonary nodule: Secondary | ICD-10-CM | POA: Insufficient documentation

## 2021-11-04 DIAGNOSIS — R9389 Abnormal findings on diagnostic imaging of other specified body structures: Secondary | ICD-10-CM | POA: Insufficient documentation

## 2021-11-04 DIAGNOSIS — R7303 Prediabetes: Secondary | ICD-10-CM | POA: Insufficient documentation

## 2021-11-04 DIAGNOSIS — M8588 Other specified disorders of bone density and structure, other site: Secondary | ICD-10-CM | POA: Insufficient documentation

## 2021-11-04 DIAGNOSIS — M19049 Primary osteoarthritis, unspecified hand: Secondary | ICD-10-CM | POA: Insufficient documentation

## 2021-11-04 DIAGNOSIS — Z79899 Other long term (current) drug therapy: Secondary | ICD-10-CM | POA: Insufficient documentation

## 2021-11-04 DIAGNOSIS — M4800 Spinal stenosis, site unspecified: Secondary | ICD-10-CM | POA: Insufficient documentation

## 2021-11-04 DIAGNOSIS — C3490 Malignant neoplasm of unspecified part of unspecified bronchus or lung: Secondary | ICD-10-CM

## 2021-11-04 DIAGNOSIS — F411 Generalized anxiety disorder: Secondary | ICD-10-CM | POA: Insufficient documentation

## 2021-11-04 DIAGNOSIS — M549 Dorsalgia, unspecified: Secondary | ICD-10-CM | POA: Insufficient documentation

## 2021-11-04 DIAGNOSIS — M1990 Unspecified osteoarthritis, unspecified site: Secondary | ICD-10-CM | POA: Insufficient documentation

## 2021-11-04 DIAGNOSIS — R3915 Urgency of urination: Secondary | ICD-10-CM | POA: Insufficient documentation

## 2021-11-04 DIAGNOSIS — E66811 Obesity, class 1: Secondary | ICD-10-CM | POA: Insufficient documentation

## 2021-11-04 DIAGNOSIS — E782 Mixed hyperlipidemia: Secondary | ICD-10-CM | POA: Insufficient documentation

## 2021-11-04 DIAGNOSIS — Z8342 Family history of familial hypercholesterolemia: Secondary | ICD-10-CM | POA: Insufficient documentation

## 2021-11-04 DIAGNOSIS — I700 Atherosclerosis of aorta: Secondary | ICD-10-CM | POA: Insufficient documentation

## 2021-11-04 DIAGNOSIS — L309 Dermatitis, unspecified: Secondary | ICD-10-CM | POA: Insufficient documentation

## 2021-11-04 DIAGNOSIS — E669 Obesity, unspecified: Secondary | ICD-10-CM | POA: Insufficient documentation

## 2021-11-04 LAB — CBC WITH DIFFERENTIAL (CANCER CENTER ONLY)
Abs Immature Granulocytes: 0.01 10*3/uL (ref 0.00–0.07)
Basophils Absolute: 0.1 10*3/uL (ref 0.0–0.1)
Basophils Relative: 1 %
Eosinophils Absolute: 0.2 10*3/uL (ref 0.0–0.5)
Eosinophils Relative: 4 %
HCT: 37.7 % (ref 36.0–46.0)
Hemoglobin: 13 g/dL (ref 12.0–15.0)
Immature Granulocytes: 0 %
Lymphocytes Relative: 35 %
Lymphs Abs: 2.1 10*3/uL (ref 0.7–4.0)
MCH: 30.5 pg (ref 26.0–34.0)
MCHC: 34.5 g/dL (ref 30.0–36.0)
MCV: 88.5 fL (ref 80.0–100.0)
Monocytes Absolute: 0.5 10*3/uL (ref 0.1–1.0)
Monocytes Relative: 9 %
Neutro Abs: 3 10*3/uL (ref 1.7–7.7)
Neutrophils Relative %: 51 %
Platelet Count: 246 10*3/uL (ref 150–400)
RBC: 4.26 MIL/uL (ref 3.87–5.11)
RDW: 13.2 % (ref 11.5–15.5)
WBC Count: 5.9 10*3/uL (ref 4.0–10.5)
nRBC: 0 % (ref 0.0–0.2)

## 2021-11-04 LAB — CMP (CANCER CENTER ONLY)
ALT: 20 U/L (ref 0–44)
AST: 22 U/L (ref 15–41)
Albumin: 4.3 g/dL (ref 3.5–5.0)
Alkaline Phosphatase: 63 U/L (ref 38–126)
Anion gap: 5 (ref 5–15)
BUN: 21 mg/dL (ref 8–23)
CO2: 29 mmol/L (ref 22–32)
Calcium: 9.2 mg/dL (ref 8.9–10.3)
Chloride: 106 mmol/L (ref 98–111)
Creatinine: 0.74 mg/dL (ref 0.44–1.00)
GFR, Estimated: >60 mL/min (ref >60)
Glucose, Bld: 120 mg/dL — ABNORMAL HIGH (ref 70–99)
Potassium: 4.3 mmol/L (ref 3.5–5.1)
Sodium: 140 mmol/L (ref 135–145)
Total Bilirubin: 0.5 mg/dL (ref 0.3–1.2)
Total Protein: 7.1 g/dL (ref 6.5–8.1)

## 2021-11-04 NOTE — Progress Notes (Signed)
[place] [phone]   [phone]  CONSULT NOTE  REFERRING PHYSICIAN: Dr. Melinda Crutch  REASON FOR CONSULTATION:  76 years old white female with suspicious lung cancer.  HPI Priscilla Taylor is a 76 y.o. female never smoker with past medical history significant for chronic kidney disease, coronary atherosclerosis, osteoarthritis, GERD, dyslipidemia as well as eczema.  The patient had CT cardiac coronary artery calcium score performed on Aug 02, 2021 and incidentally she was found to have a mass in the left upper lobe measuring approximately 1.7 x 1.1 x 1.4 cm suspicious for primary lung carcinoma.  This was followed by CT scan of the chest with contrast on 09/23/2021 and that showed 1.7 x 1.1 x 1.4 cm noncalcified nodule in the left upper lobe with possibility of malignant neoplasm is not excluded.  There was no significant lymphadenopathy seen.  The patient then had a PET scan on 10/20/2021 and that showed hypermetabolic 1.6 cm left upper lobe nodule with maximum SUV of 6.1 most compatible with malignancy and no findings of metastatic spread.  There was also a right ovarian photopenic cystic lesion measuring 3.6 cm in long axis that require follow-up in 6-12 months. Dr. Harrington Challenger kindly referred the patient to me today for evaluation and recommendation regarding treatment of her condition. When seen today the patient is feeling fine with no concerning complaints.  She denied having any current chest pain, shortness of breath, cough or hemoptysis.  She denied having any nausea, vomiting, diarrhea or constipation.  She has no headache but occasional visual changes secondary to glaucoma.  She has no recent weight loss or night sweats. Family history significant for father and mother with heart disease.  Her mother also has history of alcohol abuse and died at age 39. The patient is a widow and has 1 daughter, Priscilla Taylor who accompanied her to the visit today.  The patient  has no history of smoking but drinks alcohol occasionally and no history of drug abuse.  HPI  Past Medical History:  Diagnosis Date   Arthritis    Chronic kidney disease    x 2   Coronary atherosclerosis due to calcified coronary lesion    Eczema    Frequency of urination    at bedtime   GERD (gastroesophageal reflux disease)    Hyperlipidemia    Pneumonia    hx of years ago   Reflux     Past Surgical History:  Procedure Laterality Date   APPENDECTOMY     BREAST BIOPSY Left 03/28/1984   CATARACT EXTRACTION Bilateral    CESAREAN SECTION     JOINT REPLACEMENT Left 03/28/2002   knee   TOTAL KNEE ARTHROPLASTY Right 06/11/2012   Procedure: TOTAL KNEE ARTHROPLASTY;  Surgeon: Vickey Huger, MD;  Location: [facility];  Service: Orthopedics;  Laterality: Right;   TOTAL KNEE ARTHROPLASTY Right 06/11/2012   Dr Ronnie Derby    Family History  Problem Relation Age of Onset   Heart disease Mother    Alcoholism Mother    Heart attack Father    Heart disease Father     Social History Social History   Tobacco Use   Smoking status: Never   Smokeless tobacco: Never  Substance Use Topics   Alcohol use: Yes    Alcohol/week: 1.0 standard drink of alcohol    Types: 1 Glasses of wine per week    Comment: rare   Drug use: No    Allergies  Allergen Reactions  Elemental Sulfur Hives   Amoxicillin Other (See Comments)    vaginitis   Lipitor [Atorvastatin] Other (See Comments)    ache   Zocor [Simvastatin] Other (See Comments)    ache    Current Outpatient Medications  Medication Sig Dispense Refill   ALPRAZolam (XANAX) 0.25 MG tablet alprazolam 0.25 mg tablet     aspirin EC 81 MG tablet Take 1 tablet (81 mg total) by mouth daily. Swallow whole. 30 tablet 12   Biotin 5000 MCG CAPS Take by mouth.     Cholecalciferol (VITAMIN D3) 50 MCG (2000 UT) capsule Take 1 capsule by mouth daily.     citalopram (CELEXA) 20 MG tablet Take 20 mg by mouth daily.     Coenzyme Q10 (COQ-10) 100 MG CAPS  Take 1 capsule by mouth daily.     DORZOLAMIDE HCL OP      latanoprost (XALATAN) 0.005 % ophthalmic solution 1 drop into affected eye in the evening     Magnesium 100 MG TABS Take 1 tablet by mouth daily.     meloxicam (MOBIC) 15 MG tablet Take 15 mg by mouth daily.     Multiple Vitamin (MULTIVITAMIN) tablet Take 1 tablet by mouth daily.     Multiple Vitamins-Minerals (PRESERVISION AREDS 2) CAPS See admin instructions.     niacin 500 MG tablet Take 500 mg by mouth 4 (four) times daily.      Probiotic Product (PROBIOTIC-10 PO) Take by mouth.     rosuvastatin (CRESTOR) 20 MG tablet Take 20 mg by mouth daily.     timolol (TIMOPTIC) 0.5 % ophthalmic solution Place 1 drop into both eyes daily.     Turmeric 500 MG CAPS See admin instructions.     No current facility-administered medications for this visit.    Review of Systems  Constitutional: positive for fatigue Eyes: negative Ears, nose, mouth, throat, and face: negative Respiratory: negative Cardiovascular: negative Gastrointestinal: negative Genitourinary:negative Integument/breast: negative Hematologic/lymphatic: negative Musculoskeletal:negative Neurological: negative Behavioral/Psych: negative Endocrine: negative Allergic/Immunologic: negative  Physical Exam  MVE:HMCNO, healthy, no distress, well nourished, and well developed SKIN: skin color, texture, turgor are normal, no rashes or significant lesions HEAD: Normocephalic, No masses, lesions, tenderness or abnormalities EYES: normal, PERRLA, Conjunctiva are pink and non-injected EARS: External ears normal, Canals clear OROPHARYNX:no exudate, no erythema, and lips, buccal mucosa, and tongue normal  NECK: supple, no adenopathy, no JVD LYMPH:  no palpable lymphadenopathy, no hepatosplenomegaly BREAST:not examined LUNGS: clear to auscultation , and palpation HEART: regular rate & rhythm, no murmurs, and no gallops ABDOMEN:abdomen soft, non-tender, normal bowel sounds, and  no masses or organomegaly BACK: Back symmetric, no curvature., No CVA tenderness EXTREMITIES:no joint deformities, effusion, or inflammation, no edema  NEURO: alert & oriented x 3 with fluent speech, no focal motor/sensory deficits  PERFORMANCE STATUS: ECOG 0  LABORATORY DATA: Lab Results  Component Value Date   WBC 5.9 11/04/2021   HGB 13.0 11/04/2021   HCT 37.7 11/04/2021   MCV 88.5 11/04/2021   PLT 246 11/04/2021      Chemistry      Component Value Date/Time   NA 138 06/13/2012 0455   K 4.2 06/13/2012 0455   CL 103 06/13/2012 0455   CO2 25 06/13/2012 0455   BUN 11 06/13/2012 0455   CREATININE 0.60 06/13/2012 0455      Component Value Date/Time   CALCIUM 8.7 06/13/2012 0455   ALKPHOS 81 06/05/2012 0836   AST 20 06/05/2012 0836   ALT 21 06/05/2012 0836  BILITOT 0.3 06/05/2012 0836       RADIOGRAPHIC STUDIES: NM PET Image Initial (PI) Skull Base To Thigh (F-18 FDG)  Result Date: 10/21/2021 CLINICAL DATA:  Initial treatment strategy for left upper lobe lung nodule. EXAM: NUCLEAR MEDICINE PET SKULL BASE TO THIGH TECHNIQUE: 9.2 mCi F-18 FDG was injected intravenously. Full-ring PET imaging was performed from the skull base to thigh after the radiotracer. CT data was obtained and used for attenuation correction and anatomic localization. Fasting blood glucose: 102 mg/dl COMPARISON:  Chest CT 09/23/2021 FINDINGS: Mediastinal blood pool activity: SUV max 2.4 Liver activity: SUV max NA NECK: Mild asymmetry of the palatine tonsillar tissue, maximum SUV 5.0 on the right and 3.1 on the left, without obvious CT abnormality. While likely incidental less is technically nonspecific. Incidental CT findings: none CHEST: 1.6 by 1.2 cm left upper lobe nodule on image 25 series 7 has a maximum SUV of 6.1, most compatible with malignancy. No hypermetabolic adenopathy observed. Incidental CT findings: Coronary, aortic arch, and branch vessel atherosclerotic vascular disease. Mild cardiomegaly.  ABDOMEN/PELVIS: No significant abnormal hypermetabolic activity in this region. Incidental CT findings: Atherosclerosis is present, including aortoiliac atherosclerotic disease. Photopenic simple appearing right ovarian cyst 3.6 by 3.1 cm on image 161 series 4. SKELETON: No significant abnormal hypermetabolic activity in this region. Incidental CT findings: Mild dextroconvex lumbar rotary scoliosis. Thoracolumbar spondylosis especially in the lower lumbar spine. IMPRESSION: 1. Hypermetabolic 1.6 cm left upper lobe nodule, maximum SUV 6.1, most compatible with malignancy. No findings of metastatic spread. 2. Right ovarian photopenic cystic lesion 3.6 cm in long axis. Recommend follow-up US in 6-12 months. Note: This recommendation does not apply to premenarchal patients and to those with increased risk (genetic, family history, elevated tumor markers or other high-risk factors) of ovarian cancer. Reference: JACR 2020 Feb; 17(2):248-254 3. Other imaging findings of potential clinical significance: Aortic Atherosclerosis (ICD10-I70.0). Coronary and systemic atherosclerosis. Mild cardiomegaly. Mild lumbar scoliosis. Thoracolumbar spondylosis. Electronically Signed   By: Van Clines M.D.   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   PLAN: I had a lengthy discussion with the patient and her daughter today about her current condition and further investigation to confirm her diagnosis as well as possible treatment options. I personally and independently reviewed her scan images and discussed the result and showed the images to the patient and her daughter. I recommended for the patient to complete the staging workup by ordering MRI of the brain to rule out brain metastasis. I  also discussed with the patient and her treatment options and strongly recommend for her to consider surgical resection since she is a never smoker and likely has a good pulmonary function. I will also order pulmonary function test to have it available for the surgeon before evaluation. I will refer the patient to cardiothoracic surgery and she would likely will see Dr. Roxan Hockey for discussion of her surgical option. I will arrange for the patient a follow-up appointment with me after her surgical evaluation and probably resection to discuss any need for adjuvant treatment or close monitoring. The patient and her daughter are in agreement with the current plan. She was advised to call if she has any concerning symptoms in the interval. The patient voices understanding of current disease status  and treatment options and is in agreement with the current care plan.  All questions were answered. The patient knows to call the clinic with any problems, questions or concerns. We can certainly see the patient much sooner if necessary.  Thank you so much for allowing me to participate in the care of Priscilla Taylor. I will continue to follow up the patient with you and assist in her care.  The total time spent in the appointment was 60 minutes.  Disclaimer: This note was dictated with voice recognition software. Similar sounding words can inadvertently be transcribed and may not be corrected upon review.   Eilleen Kempf November 04, 2021, 2:24 PM

## 2021-11-12 ENCOUNTER — Ambulatory Visit: Payer: No Typology Code available for payment source

## 2021-11-12 DIAGNOSIS — I2584 Coronary atherosclerosis due to calcified coronary lesion: Secondary | ICD-10-CM

## 2021-11-12 DIAGNOSIS — I700 Atherosclerosis of aorta: Secondary | ICD-10-CM

## 2021-11-12 DIAGNOSIS — R931 Abnormal findings on diagnostic imaging of heart and coronary circulation: Secondary | ICD-10-CM

## 2021-11-12 DIAGNOSIS — I2510 Atherosclerotic heart disease of native coronary artery without angina pectoris: Secondary | ICD-10-CM | POA: Diagnosis not present

## 2021-11-15 ENCOUNTER — Ambulatory Visit: Payer: No Typology Code available for payment source

## 2021-11-15 DIAGNOSIS — I700 Atherosclerosis of aorta: Secondary | ICD-10-CM

## 2021-11-15 DIAGNOSIS — I2510 Atherosclerotic heart disease of native coronary artery without angina pectoris: Secondary | ICD-10-CM | POA: Diagnosis not present

## 2021-11-15 DIAGNOSIS — R931 Abnormal findings on diagnostic imaging of heart and coronary circulation: Secondary | ICD-10-CM

## 2021-11-17 ENCOUNTER — Other Ambulatory Visit: Payer: No Typology Code available for payment source

## 2021-11-23 ENCOUNTER — Ambulatory Visit (HOSPITAL_COMMUNITY)
Admission: RE | Admit: 2021-11-23 | Discharge: 2021-11-23 | Disposition: A | Discharge status: Home / Self Care | Payer: No Typology Code available for payment source | Source: Ambulatory Visit | Attending: Internal Medicine | Admitting: Internal Medicine

## 2021-11-23 DIAGNOSIS — G3189 Other specified degenerative diseases of nervous system: Secondary | ICD-10-CM | POA: Diagnosis not present

## 2021-11-23 DIAGNOSIS — C7931 Secondary malignant neoplasm of brain: Secondary | ICD-10-CM | POA: Insufficient documentation

## 2021-11-23 DIAGNOSIS — I672 Cerebral atherosclerosis: Secondary | ICD-10-CM | POA: Diagnosis not present

## 2021-11-23 DIAGNOSIS — C3490 Malignant neoplasm of unspecified part of unspecified bronchus or lung: Secondary | ICD-10-CM | POA: Insufficient documentation

## 2021-11-23 MED ORDER — SODIUM CHLORIDE (PF) 0.9 % IJ SOLN
INTRAMUSCULAR | Status: AC
  Filled 2021-11-23: qty 50

## 2021-11-23 MED ORDER — IOHEXOL 300 MG/ML  SOLN
75.0000 mL | Freq: Once | INTRAMUSCULAR | Status: AC | PRN
  Administered 2021-11-23: 75 mL via INTRAVENOUS

## 2021-11-24 ENCOUNTER — Ambulatory Visit: Payer: No Typology Code available for payment source | Admitting: Cardiology

## 2021-12-02 ENCOUNTER — Ambulatory Visit: Payer: No Typology Code available for payment source | Admitting: Cardiology

## 2021-12-03 DIAGNOSIS — L570 Actinic keratosis: Secondary | ICD-10-CM | POA: Diagnosis not present

## 2021-12-03 DIAGNOSIS — L738 Other specified follicular disorders: Secondary | ICD-10-CM | POA: Diagnosis not present

## 2021-12-03 DIAGNOSIS — L821 Other seborrheic keratosis: Secondary | ICD-10-CM | POA: Diagnosis not present

## 2021-12-03 DIAGNOSIS — D1801 Hemangioma of skin and subcutaneous tissue: Secondary | ICD-10-CM | POA: Diagnosis not present

## 2021-12-16 ENCOUNTER — Ambulatory Visit: Payer: No Typology Code available for payment source | Admitting: Cardiology

## 2021-12-16 ENCOUNTER — Encounter: Payer: Self-pay | Admitting: Cardiology

## 2021-12-16 VITALS — BP 137/62 | HR 69 | Temp 97.6°F | Resp 16 | Ht 62.0 in | Wt 187.0 lb

## 2021-12-16 DIAGNOSIS — R7303 Prediabetes: Secondary | ICD-10-CM

## 2021-12-16 DIAGNOSIS — R931 Abnormal findings on diagnostic imaging of heart and coronary circulation: Secondary | ICD-10-CM | POA: Diagnosis not present

## 2021-12-16 DIAGNOSIS — I2510 Atherosclerotic heart disease of native coronary artery without angina pectoris: Secondary | ICD-10-CM

## 2021-12-16 DIAGNOSIS — I700 Atherosclerosis of aorta: Secondary | ICD-10-CM

## 2021-12-16 DIAGNOSIS — E782 Mixed hyperlipidemia: Secondary | ICD-10-CM | POA: Diagnosis not present

## 2021-12-16 DIAGNOSIS — I2584 Coronary atherosclerosis due to calcified coronary lesion: Secondary | ICD-10-CM | POA: Diagnosis not present

## 2021-12-16 NOTE — Progress Notes (Signed)
ID:  Priscilla Taylor, DOB 03/23/1946, MRN [id number]  PCP:  Lawerance Cruel, MD  Cardiologist:  Rex Kras, DO, [facility] (established care 10/08/2021)  Date: 12/16/21 Last Office Visit: 10/08/2021  No chief complaint on file.   HPI  Priscilla Taylor is a 75 y.o. Caucasian female who presents to the clinic for evaluation of coronary artery calcification at the request of Lawerance Cruel, MD. Her past medical history and cardiovascular risk factors include: Prediabetes, hyperlipidemia, aortic atherosclerosis, moderate coronary calcification (total CAC 118.5 AU, 66th percentile as of May 2023).  Prior to establishing care she had a coronary calcium score at the request of her PCP and she was noted to have moderate CAC with a total score of 118.5 AU, 66th percentile.  She was referred to cardiology for further evaluation and statin therapy dose was increased.  At the last office visit we discussed undergoing an echo and stress test.  Results reviewed with her in great detail and noted below for further reference.  Since last office visit, patient is doing well from a cardiovascular standpoint.  She denies anginal discomfort or heart failure symptoms.  She is tolerated aspirin 81 mg p.o. daily well without any side effects of bleeding.  She has also tolerated the higher dose of statin therapy provided by PCP.  She has not had any follow-up blood work to reevaluate her lipid profile and LFTs.  She has been evaluated by oncology after undergoing a PET scan and plans to see a surgeon in the coming weeks.  No change in overall physical endurance.  FUNCTIONAL STATUS: Walks 1 mile at least 3 days a week.  Remainder of the day she goes to the [facility] and does water aerobics for approximately 2 hours at a time for 2 days of the week.  ALLERGIES: Allergies  Allergen Reactions   Elemental Sulfur Hives   Amoxicillin Other (See Comments)    vaginitis   Lipitor [Atorvastatin] Other (See Comments)     ache   Zocor [Simvastatin] Other (See Comments)    ache    MEDICATION LIST PRIOR TO VISIT: Current Meds  Medication Sig   ALPRAZolam (XANAX) 0.25 MG tablet alprazolam 0.25 mg tablet   aspirin EC 81 MG tablet Take 1 tablet (81 mg total) by mouth daily. Swallow whole.   Cholecalciferol (VITAMIN D3) 50 MCG (2000 UT) capsule Take 1 capsule by mouth daily.   citalopram (CELEXA) 20 MG tablet Take 20 mg by mouth daily.   Coenzyme Q10 (COQ-10) 100 MG CAPS Take 1 capsule by mouth daily.   DORZOLAMIDE HCL OP    latanoprost (XALATAN) 0.005 % ophthalmic solution 1 drop into affected eye in the evening   Magnesium 100 MG TABS Take 1 tablet by mouth daily.   meloxicam (MOBIC) 15 MG tablet Take 15 mg by mouth daily.   Multiple Vitamin (MULTIVITAMIN) tablet Take 1 tablet by mouth daily.   Multiple Vitamins-Minerals (PRESERVISION AREDS 2) CAPS See admin instructions.   niacin 500 MG tablet Take 500 mg by mouth 4 (four) times daily.    Probiotic Product (PROBIOTIC-10 PO) Take by mouth.   rosuvastatin (CRESTOR) 20 MG tablet Take 20 mg by mouth daily.   timolol (TIMOPTIC) 0.5 % ophthalmic solution Place 1 drop into both eyes daily.   Turmeric 500 MG CAPS See admin instructions.     PAST MEDICAL HISTORY: Past Medical History:  Diagnosis Date   Arthritis    Chronic kidney disease    x 2  Coronary atherosclerosis due to calcified coronary lesion    Eczema    Frequency of urination    at bedtime   GERD (gastroesophageal reflux disease)    Hyperlipidemia    Pneumonia    hx of years ago   Reflux     PAST SURGICAL HISTORY: Past Surgical History:  Procedure Laterality Date   APPENDECTOMY     BREAST BIOPSY Left 03/28/1984   CATARACT EXTRACTION Bilateral    CESAREAN SECTION     JOINT REPLACEMENT Left 03/28/2002   knee   TOTAL KNEE ARTHROPLASTY Right 06/11/2012   Procedure: TOTAL KNEE ARTHROPLASTY;  Surgeon: Vickey Huger, MD;  Location: [facility];  Service: Orthopedics;  Laterality: Right;    TOTAL KNEE ARTHROPLASTY Right 06/11/2012   Dr Ronnie Derby    FAMILY HISTORY: The patient family history includes Alcoholism in her mother; Heart attack in her father; Heart disease in her father and mother.  SOCIAL HISTORY:  The patient  reports that she has never smoked. She has never used smokeless tobacco. She reports current alcohol use of about 1.0 standard drink of alcohol per week. She reports that she does not use drugs.  REVIEW OF SYSTEMS: Review of Systems  Cardiovascular:  Negative for chest pain, claudication, dyspnea on exertion, irregular heartbeat, leg swelling, near-syncope, orthopnea, palpitations, paroxysmal nocturnal dyspnea and syncope.  Respiratory:  Negative for shortness of breath.   Hematologic/Lymphatic: Negative for bleeding problem.  Musculoskeletal:  Negative for muscle cramps and myalgias.  Neurological:  Negative for dizziness and light-headedness.    PHYSICAL EXAM:    12/16/2021   11:00 AM 11/04/2021    2:30 PM 10/08/2021    9:20 AM  Vitals with BMI  Height _0  _1    Weight 187 lbs 187 lbs   BMI 41.74 08.14   Systolic 481 856 314  Diastolic 62 52 86  Pulse 69 68 68    Physical Exam  Constitutional:  Age appropriate, hemodynamically stable, no acute distress.   Neck: No JVD present.  Cardiovascular: Normal rate, regular rhythm, S1 normal and S2 normal. Exam reveals no gallop and no friction rub.  No murmur heard. Pulses:      Dorsalis pedis pulses are 2+ on the right side and 2+ on the left side.       Posterior tibial pulses are 2+ on the right side and 2+ on the left side.  Pulmonary/Chest: Breath sounds normal. She has no wheezes. She has no rales. She exhibits no tenderness.  Abdominal: Soft. Bowel sounds are normal. She exhibits no distension. There is no abdominal tenderness.  Musculoskeletal:        General: No tenderness or edema.  Neurological: She is alert and oriented to person, place, and time.  Skin: Skin is warm and dry.    CARDIAC DATABASE: EKG: 10/08/2021: NSR, 70 bpm, without underlying ischemia injury pattern.  Echocardiogram: 11/12/2021: Normal LV systolic function with visual EF 60-65%. Left ventricle cavity is normal in size. Mild concentric hypertrophy of the left ventricle. Normal global wall motion. Doppler evidence of grade I (impaired) diastolic dysfunction, normal LAP. Calculated EF 65%. Structurally normal trileaflet aortic valve. Mild (Grade I) aortic regurgitation. Structurally normal tricuspid valve with trace regurgitation. No evidence of pulmonary hypertension.   Stress Testing: Lexiscan (with Mod Bruce protocol) Nuclear stress test 11/15/21 Myocardial perfusion is normal. Overall LV systolic function is normal without regional wall motion abnormalities. Stress LV EF: 49% which is mildly reduced. Rest EF normal at 61%. Abnormal ECG stress. Peak  EKG/ECG demonstrated normal sinus rhythm. Greater than 2 mm horizontal ST depression of the inferior leads. The heart rate response was consistent with Regadenoson. The blood pressure response was physiologic. No previous exam available for comparison.  Heart Catheterization: None  Carotid duplex: 12/05/2014 Mild (1-49%) stenosis in the proximal left ICA secondary to smooth but hypoechoic lipid rich atherosclerotic plaque.  Mild intimal thickening of the right without evidence of ICA stenosis. Vertebral arteries are patent and antegrade flow.  Coronary artery calcium score: 08/03/2021: Left Main: 0   LAD: 118   LCx: 0.5   RCA: 0   Total Agatston Score: 118.5   MESA database percentile: 66   AORTA MEASUREMENTS:   Ascending Aorta: 27 mm   Descending Aorta: 21 mm  Incidental detection of a mass in the left upper lobe measuring approximately 1.7 x 1.1 x 1.4 cm. This is potentially representative of a primary lung carcinoma. Further evaluation is recommended by a full CT of the chest with IV contrast. Additional  multi-disciplinary thoracic oncologic evaluation recommended. The nodule is near a branch point of the anterior left upper lobe bronchus and may be approachable by bronchoscopy.   LABORATORY DATA:    Latest Ref Rng & Units 11/04/2021    1:50 PM 06/13/2012    4:55 AM 06/12/2012    5:55 AM  CBC  WBC 4.0 - 10.5 K/uL 5.9  11.9  12.2   Hemoglobin 12.0 - 15.0 g/dL 13.0  10.1  10.6   Hematocrit 36.0 - 46.0 % 37.7  29.6  31.4   Platelets 150 - 400 K/uL 246  221  256        Latest Ref Rng & Units 11/04/2021    1:50 PM 06/13/2012    4:55 AM 06/12/2012    5:55 AM  CMP  Glucose 70 - 99 mg/dL 120  146  133   BUN 8 - 23 mg/dL _0 Creatinine 0.44 - 1.00 mg/dL 0.74  0.60  0.56   Sodium 135 - 145 mmol/L 140  138  138   Potassium 3.5 - 5.1 mmol/L 4.3  4.2  4.6   Chloride 98 - 111 mmol/L 106  103  103   CO2 22 - 32 mmol/L _1 Calcium 8.9 - 10.3 mg/dL 9.2  8.7  8.8   Total Protein 6.5 - 8.1 g/dL 7.1     Total Bilirubin 0.3 - 1.2 mg/dL 0.5     Alkaline Phos 38 - 126 U/L 63     AST 15 - 41 U/L 22     ALT 0 - 44 U/L 20       Lipid Panel  No results found for: "CHOL", "TRIG", "HDL", "CHOLHDL", "VLDL", "LDLCALC", "LDLDIRECT", "LABVLDL"  No components found for: "NTPROBNP" No results for input(s): "PROBNP" in the last 8760 hours. No results for input(s): "TSH" in the last 8760 hours.  BMP Recent Labs    11/04/21 1350  NA 140  K 4.3  CL 106  CO2 29  GLUCOSE 120*  BUN 21  CREATININE 0.74  CALCIUM 9.2  GFRNONAA >60    HEMOGLOBIN A1C No results found for: "HGBA1C", "MPG"  External Labs:  Date Collected: 06/23/2021 , information obtained by referring physician Potassium: 4.6 BUN 19, creatinine 0.68 mg/dL. eGFR: 91 mL/min per 1.73 m Lipid profile: Total cholesterol 153 , triglycerides 176 , HDL 61 , LDL 63, non-HDL 92 AST: 27 , ALT: 29 , alkaline phosphatase: 70  Hemoglobin A1c: 5.8  IMPRESSION:    ICD-10-CM   1. Total CAC 118.5, 66 percentile as of May 2023   R93.1     2. Coronary atherosclerosis due to calcified coronary lesion  I25.10    I25.84     3. Atherosclerosis of aorta (HCC)  I70.0     4. Prediabetes  R73.03     5. Mixed hyperlipidemia  E78.2        RECOMMENDATIONS: JALISIA PUCHALSKI is a 76 y.o. Caucasian female whose past medical history and cardiac risk factors include: Prediabetes, hyperlipidemia, aortic atherosclerosis, moderate coronary calcification (total CAC 118.5 AU, 66th percentile as of May 2023).  Total CAC 118.5, 66 percentile as of May 2023 / Coronary atherosclerosis due to calcified coronary lesion / Atherosclerosis of aorta (HCC) Denies angina pectoris. Continue aspirin and statin therapy. Results of the echo and stress test reviewed with the patient at today's office visit.  Images also reviewed with the patient. Continue physical activity as tolerated with moderate intensity exercise 30 minutes a day 5 days a week. Educated her on the importance of improving and addressing her modifiable cardiovascular risk factors including glycemic and lipid management  Prediabetes Currently managed by primary care provider.  Mixed hyperlipidemia Currently on rosuvastatin. Does not endorse myalgias. Her dose of rosuvastatin was increased by PCP after knowing her coronary calcium score, agree with the change.  However, I would recommend a repeat fasting lipid profile after being on the higher dose of statin for 6 weeks to reevaluate her lipids and LFTs. Patient states that she will follow-up with PCP.  From a cardiovascular standpoint no additional cardiovascular work-up is needed. We discussed undergoing an echocardiogram in 3 to 5 years to reevaluate the progression of aortic regurgitation. Shared decision was to follow-up on an annual basis after her well visit.   FINAL MEDICATION LIST END OF ENCOUNTER: No orders of the defined types were placed in this encounter.   Medications Discontinued During This Encounter   Medication Reason   Biotin 5000 MCG CAPS      Current Outpatient Medications:    ALPRAZolam (XANAX) 0.25 MG tablet, alprazolam 0.25 mg tablet, Disp: , Rfl:    aspirin EC 81 MG tablet, Take 1 tablet (81 mg total) by mouth daily. Swallow whole., Disp: 30 tablet, Rfl: 12   Cholecalciferol (VITAMIN D3) 50 MCG (2000 UT) capsule, Take 1 capsule by mouth daily., Disp: , Rfl:    citalopram (CELEXA) 20 MG tablet, Take 20 mg by mouth daily., Disp: , Rfl:    Coenzyme Q10 (COQ-10) 100 MG CAPS, Take 1 capsule by mouth daily., Disp: , Rfl:    DORZOLAMIDE HCL OP, , Disp: , Rfl:    latanoprost (XALATAN) 0.005 % ophthalmic solution, 1 drop into affected eye in the evening, Disp: , Rfl:    Magnesium 100 MG TABS, Take 1 tablet by mouth daily., Disp: , Rfl:    meloxicam (MOBIC) 15 MG tablet, Take 15 mg by mouth daily., Disp: , Rfl:    Multiple Vitamin (MULTIVITAMIN) tablet, Take 1 tablet by mouth daily., Disp: , Rfl:    Multiple Vitamins-Minerals (PRESERVISION AREDS 2) CAPS, See admin instructions., Disp: , Rfl:    niacin 500 MG tablet, Take 500 mg by mouth 4 (four) times daily. , Disp: , Rfl:    Probiotic Product (PROBIOTIC-10 PO), Take by mouth., Disp: , Rfl:    rosuvastatin (CRESTOR) 20 MG tablet, Take 20 mg by mouth daily., Disp: , Rfl:    timolol (TIMOPTIC)  0.5 % ophthalmic solution, Place 1 drop into both eyes daily., Disp: , Rfl:    Turmeric 500 MG CAPS, See admin instructions., Disp: , Rfl:   No orders of the defined types were placed in this encounter.   There are no Patient Instructions on file for this visit.   --Continue cardiac medications as reconciled in final medication list. --No follow-ups on file. or sooner if needed. --Continue follow-up with your primary care physician regarding the management of your other chronic comorbid conditions.  Patient's questions and concerns were addressed to her satisfaction. She voices understanding of the instructions provided during this encounter.    This note was created using a voice recognition software as a result there may be grammatical errors inadvertently enclosed that do not reflect the nature of this encounter. Every attempt is made to correct such errors.  Rex Kras, [place], [facility]  Pager: [phone] Office: [phone]

## 2021-12-21 ENCOUNTER — Other Ambulatory Visit: Payer: Self-pay | Admitting: *Deleted

## 2021-12-21 ENCOUNTER — Other Ambulatory Visit: Payer: Self-pay | Admitting: Thoracic Surgery (Cardiothoracic Vascular Surgery)

## 2021-12-21 ENCOUNTER — Institutional Professional Consult (permissible substitution): Payer: No Typology Code available for payment source | Admitting: Thoracic Surgery (Cardiothoracic Vascular Surgery)

## 2021-12-21 ENCOUNTER — Encounter: Payer: Self-pay | Admitting: Thoracic Surgery (Cardiothoracic Vascular Surgery)

## 2021-12-21 ENCOUNTER — Encounter: Payer: Self-pay | Admitting: *Deleted

## 2021-12-21 VITALS — BP 147/77 | HR 65 | Resp 20 | Ht 62.0 in | Wt 185.0 lb

## 2021-12-21 DIAGNOSIS — R911 Solitary pulmonary nodule: Secondary | ICD-10-CM

## 2021-12-21 NOTE — H&P (View-Only) (Signed)
PCP is Lawerance Cruel, MD Referring Provider is Curt Bears, MD  Chief Complaint  Patient presents with   Lung Lesion    Surgical consult    HPI: Priscilla Taylor is sent for consultation regarding a left upper lobe lung nodule.  Priscilla Taylor is a 76 year old woman with a history of CAD, hyperlipidemia, reflux, and arthritis.  She is a lifelong non-smoker.  She had a CT for coronary calcium screening done earlier this year.  It showed some moderate CAD.  It also showed a left upper lobe lung nodule.  That led to a CT of the chest which showed a 1.7 x 1.4 x 1.1 cm left upper lobe nodule.  There is no mediastinal or hilar adenopathy.  A PET/CT showed the nodule was markedly hypermetabolic with an SUV of 6.1.  Again there was no evidence of regional or distant metastatic disease.  She did see cardiology.  An echocardiogram showed preserved left ventricular function with mild AI.  A stress test showed some mild ST depression in inferior leads.  She saw cardiology and had some medication adjustments.  She denies any chest pain, pressure, tightness, shortness of breath, wheezing.  She does have an occasional cough.  No fevers, chills, or sweats.  Is fairly active and walks at least a mile 3 times a week and then also walks on other days as well.  No change in appetite or weight loss.  Zubrod Score: At the time of surgery this patient's most appropriate activity status/level should be described as: [x]     0    Normal activity, no symptoms []     1    Restricted in physical strenuous activity but ambulatory, able to do out light work []     2    Ambulatory and capable of self care, unable to do work activities, up and about >50 % of waking hours                              []     3    Only limited self care, in bed greater than 50% of waking hours []     4    Completely disabled, no self care, confined to bed or chair []     5    Moribund   Past Medical History:  Diagnosis Date   Arthritis     Chronic kidney disease    x 2   Coronary atherosclerosis due to calcified coronary lesion    Eczema    Frequency of urination    at bedtime   GERD (gastroesophageal reflux disease)    Hyperlipidemia    Pneumonia    hx of years ago   Reflux     Past Surgical History:  Procedure Laterality Date   APPENDECTOMY     BREAST BIOPSY Left 03/28/1984   CATARACT EXTRACTION Bilateral    CESAREAN SECTION     JOINT REPLACEMENT Left 03/28/2002   knee   TOTAL KNEE ARTHROPLASTY Right 06/11/2012   Procedure: TOTAL KNEE ARTHROPLASTY;  Surgeon: Vickey Huger, MD;  Location: [facility];  Service: Orthopedics;  Laterality: Right;   TOTAL KNEE ARTHROPLASTY Right 06/11/2012   Dr Ronnie Derby    Family History  Problem Relation Age of Onset   Heart disease Mother    Alcoholism Mother    Heart attack Father    Heart disease Father     Social History Social History   Tobacco Use   Smoking  status: Never   Smokeless tobacco: Never  Substance Use Topics   Alcohol use: Yes    Alcohol/week: 1.0 standard drink of alcohol    Types: 1 Glasses of wine per week    Comment: rare   Drug use: No    Current Outpatient Medications  Medication Sig Dispense Refill   ALPRAZolam (XANAX) 0.25 MG tablet alprazolam 0.25 mg tablet     aspirin EC 81 MG tablet Take 1 tablet (81 mg total) by mouth daily. Swallow whole. 30 tablet 12   Cholecalciferol (VITAMIN D3) 50 MCG (2000 UT) capsule Take 1 capsule by mouth daily.     citalopram (CELEXA) 20 MG tablet Take 20 mg by mouth daily.     Coenzyme Q10 (COQ-10) 100 MG CAPS Take 1 capsule by mouth daily.     DORZOLAMIDE HCL OP      latanoprost (XALATAN) 0.005 % ophthalmic solution 1 drop into affected eye in the evening     Magnesium 100 MG TABS Take 1 tablet by mouth daily.     meloxicam (MOBIC) 15 MG tablet Take 15 mg by mouth daily.     Multiple Vitamin (MULTIVITAMIN) tablet Take 1 tablet by mouth daily.     Multiple Vitamins-Minerals (PRESERVISION AREDS 2) CAPS See admin  instructions.     niacin 500 MG tablet Take 500 mg by mouth 4 (four) times daily.      Probiotic Product (PROBIOTIC-10 PO) Take by mouth.     rosuvastatin (CRESTOR) 20 MG tablet Take 20 mg by mouth daily.     timolol (TIMOPTIC) 0.5 % ophthalmic solution Place 1 drop into both eyes daily.     Turmeric 500 MG CAPS See admin instructions.     No current facility-administered medications for this visit.    Allergies  Allergen Reactions   Elemental Sulfur Hives   Amoxicillin Other (See Comments)    vaginitis   Lipitor [Atorvastatin] Other (See Comments)    ache   Zocor [Simvastatin] Other (See Comments)    ache    Review of Systems  Constitutional:  Negative for activity change, fatigue and unexpected weight change.  HENT:  Negative for trouble swallowing and voice change.   Respiratory:  Positive for cough. Negative for shortness of breath and wheezing.   Cardiovascular:  Negative for chest pain and leg swelling.  Gastrointestinal:  Negative for abdominal distention and abdominal pain.  Genitourinary:  Negative for difficulty urinating and dysuria.  Musculoskeletal:  Positive for arthralgias.  Neurological:  Negative for seizures, syncope and weakness.  Hematological:  Negative for adenopathy. Does not bruise/bleed easily.  All other systems reviewed and are negative.   BP (!) 147/77   Pulse 65   Resp 20   Ht 5\' 2"  (1.575 m)   Wt 185 lb (83.9 kg)   SpO2 95% Comment: RA  BMI 33.84 kg/m  Physical Exam Vitals reviewed.  Constitutional:      General: She is not in acute distress. HENT:     Head: Normocephalic and atraumatic.  Eyes:     General: No scleral icterus.    Extraocular Movements: Extraocular movements intact.  Neck:     Vascular: No carotid bruit.  Cardiovascular:     Rate and Rhythm: Normal rate and regular rhythm.     Heart sounds: Normal heart sounds. No murmur heard. Pulmonary:     Effort: Pulmonary effort is normal. No respiratory distress.     Breath  sounds: Normal breath sounds. No wheezing or rales.  Abdominal:  General: There is no distension.     Palpations: Abdomen is soft.  Musculoskeletal:     Cervical back: Neck supple.  Lymphadenopathy:     Cervical: No cervical adenopathy.  Skin:    General: Skin is warm and dry.  Neurological:     General: No focal deficit present.     Mental Status: She is alert and oriented to person, place, and time.     Cranial Nerves: No cranial nerve deficit.     Motor: No weakness.    Diagnostic Tests: CT CHEST WITH CONTRAST   TECHNIQUE: Multidetector CT imaging of the chest was performed during intravenous contrast administration.   RADIATION DOSE REDUCTION: This exam was performed according to the departmental dose-optimization program which includes automated exposure control, adjustment of the mA and/or kV according to patient size and/or use of iterative reconstruction technique.   CONTRAST:  75mL ISOVUE-370 IOPAMIDOL (ISOVUE-370) INJECTION 76%   COMPARISON:  08/02/2021   FINDINGS: Cardiovascular: There is homogeneous enhancement in thoracic aorta. There are no intraluminal filling defects in the central pulmonary artery branches. Coronary artery calcifications are seen.   Mediastinum/Nodes: Subcentimeter lymph nodes are seen in the mediastinum and hilar regions.   Lungs/Pleura: In image 23 of series 5 there is 1.7 x 1.1 x 1.4 cm smooth marginated lobulated nodule in the anterior left mid lung fields in the left upper lobe. There are no calcifications in this nodule. No other discrete lung nodules are seen. There are small linear densities in both lower lung fields suggesting scarring or subsegmental atelectasis. There is no focal pulmonary consolidation. There is no pleural effusion or pneumothorax.   Upper Abdomen: There is fatty infiltration in the liver. Adrenals are unremarkable.   Musculoskeletal: No focal lytic or sclerotic lesions are seen.    IMPRESSION: There is 1.7 x 1.1 x 1.4 cm noncalcified nodule in the left upper lobe. Possibility of malignant neoplasm is not excluded. Follow-up PET-CT and tissue sampling should be considered. No other discrete lung nodules are seen. No significant lymphadenopathy seen.   Small linear densities in the lower lung fields may suggest scarring or subsegmental atelectasis. Fatty liver.     Electronically Signed   By: Elmer Picker M.D.   On: 09/24/2021 17:39 NUCLEAR MEDICINE PET SKULL BASE TO THIGH   TECHNIQUE: 9.2 mCi F-18 FDG was injected intravenously. Full-ring PET imaging was performed from the skull base to thigh after the radiotracer. CT data was obtained and used for attenuation correction and anatomic localization.   Fasting blood glucose: 102 mg/dl   COMPARISON:  Chest CT 09/23/2021   FINDINGS: Mediastinal blood pool activity: SUV max 2.4   Liver activity: SUV max NA   NECK: Mild asymmetry of the palatine tonsillar tissue, maximum SUV 5.0 on the right and 3.1 on the left, without obvious CT abnormality. While likely incidental less is technically nonspecific.   Incidental CT findings: none   CHEST: 1.6 by 1.2 cm left upper lobe nodule on image 25 series 7 has a maximum SUV of 6.1, most compatible with malignancy. No hypermetabolic adenopathy observed.   Incidental CT findings: Coronary, aortic arch, and branch vessel atherosclerotic vascular disease. Mild cardiomegaly.   ABDOMEN/PELVIS: No significant abnormal hypermetabolic activity in this region.   Incidental CT findings: Atherosclerosis is present, including aortoiliac atherosclerotic disease. Photopenic simple appearing right ovarian cyst 3.6 by 3.1 cm on image 161 series 4.   SKELETON: No significant abnormal hypermetabolic activity in this region.   Incidental CT findings: Mild dextroconvex  lumbar rotary scoliosis. Thoracolumbar spondylosis especially in the lower lumbar spine.    IMPRESSION: 1. Hypermetabolic 1.6 cm left upper lobe nodule, maximum SUV 6.1, most compatible with malignancy. No findings of metastatic spread. 2. Right ovarian photopenic cystic lesion 3.6 cm in long axis. Recommend follow-up US in 6-12 months. Note: This recommendation does not apply to premenarchal patients and to those with increased risk (genetic, family history, elevated tumor markers or other high-risk factors) of ovarian cancer. Reference: JACR 2020 Feb; 17(2):248-254 3. Other imaging findings of potential clinical significance: Aortic Atherosclerosis (ICD10-I70.0). Coronary and systemic atherosclerosis. Mild cardiomegaly. Mild lumbar scoliosis. Thoracolumbar spondylosis.     Electronically Signed   By: Van Clines M.D.   On: 10/21/2021 07:19 I personally reviewed the CT and PET images.  1.7 x 1.1 x 1.4 cm left upper lobe lung nodule that is markedly hypermetabolic with an SUV of 6.1.  No adenopathy or distant metastases.   Impression: Priscilla Taylor is a 76 year old non-smoker with a history of CAD, hyperlipidemia, reflux, and arthritis.  She was found to have a left upper lobe nodule on a CT for coronary calcium screening.  On PET/CT the nodule is markedly hypermetabolic.  There is no evidence of regional or distant metastatic disease.  Differential diagnosis includes primary bronchogenic carcinoma, carcinoid tumor, as well as infectious and inflammatory nodules.  This most likely is either a primary bronchogenic carcinoma or carcinoid tumor.  It would be a clinical stage Ia (T1, N0) lesion.    We discussed the options of biopsy followed by stereotactic radiation versus surgical resection for treatment of the nodule.  I recommended we proceed with a surgical resection for definitive diagnosis and treatment at the same setting.  I described the proposed operation to her.  We would plan to use a robotic approach.  She understands we would need to use general anesthesia,  the incisions to be used, the intraoperative decision making based on frozen section, use of a drainage tube postoperatively as well as the expected hospital stay and overall recovery.  I informed her of the indications, risk, benefits, and alternatives.  She understands the risks include, but not limited to death, MI, DVT, PE, bleeding, possible need for transfusion, possible need for conversion to open procedure, infection, prolonged air leak, cardiac arrhythmias, as well as the possibility of other unforeseeable complications.  She understands accepts the risks and wishes to proceed.  Plan: Pulmonary function testing with and without bronchodilators Robotic left upper lobe wedge resection, possible lobectomy on Wednesday, 01/12/2022  [name], MD [facility] [phone]

## 2021-12-21 NOTE — Progress Notes (Signed)
PCP is Lawerance Cruel, MD Referring Provider is Curt Bears, MD  Chief Complaint  Patient presents with   Lung Lesion    Surgical consult    HPI: Priscilla Taylor is sent for consultation regarding a left upper lobe lung nodule.  Priscilla Taylor is a 76 year old woman with a history of CAD, hyperlipidemia, reflux, and arthritis.  She is a lifelong non-smoker.  She had a CT for coronary calcium screening done earlier this year.  It showed some moderate CAD.  It also showed a left upper lobe lung nodule.  That led to a CT of the chest which showed a 1.7 x 1.4 x 1.1 cm left upper lobe nodule.  There is no mediastinal or hilar adenopathy.  A PET/CT showed the nodule was markedly hypermetabolic with an SUV of 6.1.  Again there was no evidence of regional or distant metastatic disease.  She did see cardiology.  An echocardiogram showed preserved left ventricular function with mild AI.  A stress test showed some mild ST depression in inferior leads.  She saw cardiology and had some medication adjustments.  She denies any chest pain, pressure, tightness, shortness of breath, wheezing.  She does have an occasional cough.  No fevers, chills, or sweats.  Is fairly active and walks at least a mile 3 times a week and then also walks on other days as well.  No change in appetite or weight loss.  Zubrod Score: At the time of surgery this patient's most appropriate activity status/level should be described as: [x]     0    Normal activity, no symptoms []     1    Restricted in physical strenuous activity but ambulatory, able to do out light work []     2    Ambulatory and capable of self care, unable to do work activities, up and about >50 % of waking hours                              []     3    Only limited self care, in bed greater than 50% of waking hours []     4    Completely disabled, no self care, confined to bed or chair []     5    Moribund   Past Medical History:  Diagnosis Date   Arthritis     Chronic kidney disease    x 2   Coronary atherosclerosis due to calcified coronary lesion    Eczema    Frequency of urination    at bedtime   GERD (gastroesophageal reflux disease)    Hyperlipidemia    Pneumonia    hx of years ago   Reflux     Past Surgical History:  Procedure Laterality Date   APPENDECTOMY     BREAST BIOPSY Left 03/28/1984   CATARACT EXTRACTION Bilateral    CESAREAN SECTION     JOINT REPLACEMENT Left 03/28/2002   knee   TOTAL KNEE ARTHROPLASTY Right 06/11/2012   Procedure: TOTAL KNEE ARTHROPLASTY;  Surgeon: Vickey Huger, MD;  Location: [facility];  Service: Orthopedics;  Laterality: Right;   TOTAL KNEE ARTHROPLASTY Right 06/11/2012   Dr Ronnie Derby    Family History  Problem Relation Age of Onset   Heart disease Mother    Alcoholism Mother    Heart attack Father    Heart disease Father     Social History Social History   Tobacco Use   Smoking  status: Never   Smokeless tobacco: Never  Substance Use Topics   Alcohol use: Yes    Alcohol/week: 1.0 standard drink of alcohol    Types: 1 Glasses of wine per week    Comment: rare   Drug use: No    Current Outpatient Medications  Medication Sig Dispense Refill   ALPRAZolam (XANAX) 0.25 MG tablet alprazolam 0.25 mg tablet     aspirin EC 81 MG tablet Take 1 tablet (81 mg total) by mouth daily. Swallow whole. 30 tablet 12   Cholecalciferol (VITAMIN D3) 50 MCG (2000 UT) capsule Take 1 capsule by mouth daily.     citalopram (CELEXA) 20 MG tablet Take 20 mg by mouth daily.     Coenzyme Q10 (COQ-10) 100 MG CAPS Take 1 capsule by mouth daily.     DORZOLAMIDE HCL OP      latanoprost (XALATAN) 0.005 % ophthalmic solution 1 drop into affected eye in the evening     Magnesium 100 MG TABS Take 1 tablet by mouth daily.     meloxicam (MOBIC) 15 MG tablet Take 15 mg by mouth daily.     Multiple Vitamin (MULTIVITAMIN) tablet Take 1 tablet by mouth daily.     Multiple Vitamins-Minerals (PRESERVISION AREDS 2) CAPS See admin  instructions.     niacin 500 MG tablet Take 500 mg by mouth 4 (four) times daily.      Probiotic Product (PROBIOTIC-10 PO) Take by mouth.     rosuvastatin (CRESTOR) 20 MG tablet Take 20 mg by mouth daily.     timolol (TIMOPTIC) 0.5 % ophthalmic solution Place 1 drop into both eyes daily.     Turmeric 500 MG CAPS See admin instructions.     No current facility-administered medications for this visit.    Allergies  Allergen Reactions   Elemental Sulfur Hives   Amoxicillin Other (See Comments)    vaginitis   Lipitor [Atorvastatin] Other (See Comments)    ache   Zocor [Simvastatin] Other (See Comments)    ache    Review of Systems  Constitutional:  Negative for activity change, fatigue and unexpected weight change.  HENT:  Negative for trouble swallowing and voice change.   Respiratory:  Positive for cough. Negative for shortness of breath and wheezing.   Cardiovascular:  Negative for chest pain and leg swelling.  Gastrointestinal:  Negative for abdominal distention and abdominal pain.  Genitourinary:  Negative for difficulty urinating and dysuria.  Musculoskeletal:  Positive for arthralgias.  Neurological:  Negative for seizures, syncope and weakness.  Hematological:  Negative for adenopathy. Does not bruise/bleed easily.  All other systems reviewed and are negative.   BP (!) 147/77   Pulse 65   Resp 20   Ht 5\' 2"  (1.575 m)   Wt 185 lb (83.9 kg)   SpO2 95% Comment: RA  BMI 33.84 kg/m  Physical Exam Vitals reviewed.  Constitutional:      General: She is not in acute distress. HENT:     Head: Normocephalic and atraumatic.  Eyes:     General: No scleral icterus.    Extraocular Movements: Extraocular movements intact.  Neck:     Vascular: No carotid bruit.  Cardiovascular:     Rate and Rhythm: Normal rate and regular rhythm.     Heart sounds: Normal heart sounds. No murmur heard. Pulmonary:     Effort: Pulmonary effort is normal. No respiratory distress.     Breath  sounds: Normal breath sounds. No wheezing or rales.  Abdominal:  General: There is no distension.     Palpations: Abdomen is soft.  Musculoskeletal:     Cervical back: Neck supple.  Lymphadenopathy:     Cervical: No cervical adenopathy.  Skin:    General: Skin is warm and dry.  Neurological:     General: No focal deficit present.     Mental Status: She is alert and oriented to person, place, and time.     Cranial Nerves: No cranial nerve deficit.     Motor: No weakness.    Diagnostic Tests: CT CHEST WITH CONTRAST   TECHNIQUE: Multidetector CT imaging of the chest was performed during intravenous contrast administration.   RADIATION DOSE REDUCTION: This exam was performed according to the departmental dose-optimization program which includes automated exposure control, adjustment of the mA and/or kV according to patient size and/or use of iterative reconstruction technique.   CONTRAST:  45mL ISOVUE-370 IOPAMIDOL (ISOVUE-370) INJECTION 76%   COMPARISON:  08/02/2021   FINDINGS: Cardiovascular: There is homogeneous enhancement in thoracic aorta. There are no intraluminal filling defects in the central pulmonary artery branches. Coronary artery calcifications are seen.   Mediastinum/Nodes: Subcentimeter lymph nodes are seen in the mediastinum and hilar regions.   Lungs/Pleura: In image 22 of series 5 there is 1.7 x 1.1 x 1.4 cm smooth marginated lobulated nodule in the anterior left mid lung fields in the left upper lobe. There are no calcifications in this nodule. No other discrete lung nodules are seen. There are small linear densities in both lower lung fields suggesting scarring or subsegmental atelectasis. There is no focal pulmonary consolidation. There is no pleural effusion or pneumothorax.   Upper Abdomen: There is fatty infiltration in the liver. Adrenals are unremarkable.   Musculoskeletal: No focal lytic or sclerotic lesions are seen.    IMPRESSION: There is 1.7 x 1.1 x 1.4 cm noncalcified nodule in the left upper lobe. Possibility of malignant neoplasm is not excluded. Follow-up PET-CT and tissue sampling should be considered. No other discrete lung nodules are seen. No significant lymphadenopathy seen.   Small linear densities in the lower lung fields may suggest scarring or subsegmental atelectasis. Fatty liver.     Electronically Signed   By: Elmer Picker M.D.   On: 09/24/2021 17:39 NUCLEAR MEDICINE PET SKULL BASE TO THIGH   TECHNIQUE: 9.2 mCi F-18 FDG was injected intravenously. Full-ring PET imaging was performed from the skull base to thigh after the radiotracer. CT data was obtained and used for attenuation correction and anatomic localization.   Fasting blood glucose: 102 mg/dl   COMPARISON:  Chest CT 09/23/2021   FINDINGS: Mediastinal blood pool activity: SUV max 2.4   Liver activity: SUV max NA   NECK: Mild asymmetry of the palatine tonsillar tissue, maximum SUV 5.0 on the right and 3.1 on the left, without obvious CT abnormality. While likely incidental less is technically nonspecific.   Incidental CT findings: none   CHEST: 1.6 by 1.2 cm left upper lobe nodule on image 25 series 7 has a maximum SUV of 6.1, most compatible with malignancy. No hypermetabolic adenopathy observed.   Incidental CT findings: Coronary, aortic arch, and branch vessel atherosclerotic vascular disease. Mild cardiomegaly.   ABDOMEN/PELVIS: No significant abnormal hypermetabolic activity in this region.   Incidental CT findings: Atherosclerosis is present, including aortoiliac atherosclerotic disease. Photopenic simple appearing right ovarian cyst 3.6 by 3.1 cm on image 161 series 4.   SKELETON: No significant abnormal hypermetabolic activity in this region.   Incidental CT findings: Mild dextroconvex  lumbar rotary scoliosis. Thoracolumbar spondylosis especially in the lower lumbar spine.    IMPRESSION: 1. Hypermetabolic 1.6 cm left upper lobe nodule, maximum SUV 6.1, most compatible with malignancy. No findings of metastatic spread. 2. Right ovarian photopenic cystic lesion 3.6 cm in long axis. Recommend follow-up US in 6-12 months. Note: This recommendation does not apply to premenarchal patients and to those with increased risk (genetic, family history, elevated tumor markers or other high-risk factors) of ovarian cancer. Reference: JACR 2020 Feb; 17(2):248-254 3. Other imaging findings of potential clinical significance: Aortic Atherosclerosis (ICD10-I70.0). Coronary and systemic atherosclerosis. Mild cardiomegaly. Mild lumbar scoliosis. Thoracolumbar spondylosis.     Electronically Signed   By: Van Clines M.D.   On: 10/21/2021 07:19 I personally reviewed the CT and PET images.  1.7 x 1.1 x 1.4 cm left upper lobe lung nodule that is markedly hypermetabolic with an SUV of 6.1.  No adenopathy or distant metastases.   Impression: Priscilla Taylor is a 76 year old non-smoker with a history of CAD, hyperlipidemia, reflux, and arthritis.  She was found to have a left upper lobe nodule on a CT for coronary calcium screening.  On PET/CT the nodule is markedly hypermetabolic.  There is no evidence of regional or distant metastatic disease.  Differential diagnosis includes primary bronchogenic carcinoma, carcinoid tumor, as well as infectious and inflammatory nodules.  This most likely is either a primary bronchogenic carcinoma or carcinoid tumor.  It would be a clinical stage Ia (T1, N0) lesion.    We discussed the options of biopsy followed by stereotactic radiation versus surgical resection for treatment of the nodule.  I recommended we proceed with a surgical resection for definitive diagnosis and treatment at the same setting.  I described the proposed operation to her.  We would plan to use a robotic approach.  She understands we would need to use general anesthesia,  the incisions to be used, the intraoperative decision making based on frozen section, use of a drainage tube postoperatively as well as the expected hospital stay and overall recovery.  I informed her of the indications, risk, benefits, and alternatives.  She understands the risks include, but not limited to death, MI, DVT, PE, bleeding, possible need for transfusion, possible need for conversion to open procedure, infection, prolonged air leak, cardiac arrhythmias, as well as the possibility of other unforeseeable complications.  She understands accepts the risks and wishes to proceed.  Plan: Pulmonary function testing with and without bronchodilators Robotic left upper lobe wedge resection, possible lobectomy on Wednesday, 01/12/2022  [name], MD [facility] [phone]

## 2021-12-29 DIAGNOSIS — F419 Anxiety disorder, unspecified: Secondary | ICD-10-CM | POA: Diagnosis not present

## 2021-12-29 DIAGNOSIS — Z008 Encounter for other general examination: Secondary | ICD-10-CM | POA: Diagnosis not present

## 2021-12-29 DIAGNOSIS — Z6833 Body mass index (BMI) 33.0-33.9, adult: Secondary | ICD-10-CM | POA: Diagnosis not present

## 2021-12-29 DIAGNOSIS — H409 Unspecified glaucoma: Secondary | ICD-10-CM | POA: Diagnosis not present

## 2021-12-29 DIAGNOSIS — C3490 Malignant neoplasm of unspecified part of unspecified bronchus or lung: Secondary | ICD-10-CM | POA: Diagnosis not present

## 2021-12-29 DIAGNOSIS — I2510 Atherosclerotic heart disease of native coronary artery without angina pectoris: Secondary | ICD-10-CM | POA: Diagnosis not present

## 2021-12-29 DIAGNOSIS — R7303 Prediabetes: Secondary | ICD-10-CM | POA: Diagnosis not present

## 2021-12-29 DIAGNOSIS — E785 Hyperlipidemia, unspecified: Secondary | ICD-10-CM | POA: Diagnosis not present

## 2021-12-29 DIAGNOSIS — G4700 Insomnia, unspecified: Secondary | ICD-10-CM | POA: Diagnosis not present

## 2021-12-29 DIAGNOSIS — E669 Obesity, unspecified: Secondary | ICD-10-CM | POA: Diagnosis not present

## 2021-12-29 DIAGNOSIS — Z79899 Other long term (current) drug therapy: Secondary | ICD-10-CM | POA: Diagnosis not present

## 2021-12-29 DIAGNOSIS — I700 Atherosclerosis of aorta: Secondary | ICD-10-CM | POA: Diagnosis not present

## 2021-12-29 DIAGNOSIS — M1990 Unspecified osteoarthritis, unspecified site: Secondary | ICD-10-CM | POA: Diagnosis not present

## 2021-12-30 DIAGNOSIS — E782 Mixed hyperlipidemia: Secondary | ICD-10-CM | POA: Diagnosis not present

## 2021-12-30 DIAGNOSIS — Z79899 Other long term (current) drug therapy: Secondary | ICD-10-CM | POA: Diagnosis not present

## 2022-01-03 DIAGNOSIS — E782 Mixed hyperlipidemia: Secondary | ICD-10-CM | POA: Diagnosis not present

## 2022-01-03 DIAGNOSIS — F411 Generalized anxiety disorder: Secondary | ICD-10-CM | POA: Diagnosis not present

## 2022-01-03 DIAGNOSIS — Z6833 Body mass index (BMI) 33.0-33.9, adult: Secondary | ICD-10-CM | POA: Diagnosis not present

## 2022-01-07 NOTE — Pre-Procedure Instructions (Signed)
Surgical Instructions    Your procedure is scheduled on Wednesday October 18.  Report to [facility] Main Entrance "A" at 6:30 A.M., then check in with the Admitting office.  Call this number if you have problems the morning of surgery:  [phone]  If you have any questions prior to your surgery date call [phone]: Open Monday-Friday 8am-4pm  If you experience any cold or flu symptoms such as cough, fever, chills, shortness of breath, etc. between now and your scheduled surgery, please notify us at the above number     Remember:  Do not eat or drink after midnight the night before your surgery    Take these medicines the morning of surgery with A SIP OF WATER:  citalopram (CELEXA) dorzolamide (TRUSOPT) rosuvastatin (CRESTOR) timolol (TIMOPTIC)  Follow your surgeon's instructions on when to stop Aspirin.  If no instructions were given by your surgeon then you will need to call the office to get those instructions.    As of today, STOP taking any Aleve, Naproxen, Ibuprofen, Motrin, Advil, Goody's, BC's, all herbal medications, fish oil, and all vitamins. THIS INCLUDES meloxicam (MOBIC).          Do NOT Smoke (Tobacco/Vaping)  24 hours prior to your procedure  If you use a CPAP at night, you may bring your mask for your overnight stay.   Contacts, glasses, hearing aids, dentures or partials may not be worn into surgery, please bring cases for these belongings   For patients admitted to the hospital, discharge time will be determined by your treatment team.   Patients discharged the day of surgery will not be allowed to drive home, and someone needs to stay with them for 24 hours.   SURGICAL WAITING ROOM VISITATION Patients having surgery or a procedure may have no more than 2 support people in the waiting area - these visitors may rotate.   Children under the age of 79 must have an adult with them who is not the patient. If the patient needs to stay at the hospital during  part of their recovery, the visitor guidelines for inpatient rooms apply. Pre-op nurse will coordinate an appropriate time for 1 support person to accompany patient in pre-op.  This support person may not rotate.   Please refer to the [facility] website for the visitor guidelines for Inpatients (after your surgery is over and you are in a regular room).    Special instructions:    Oral Hygiene is also important to reduce your risk of infection.  Remember - BRUSH YOUR TEETH THE MORNING OF SURGERY WITH YOUR REGULAR TOOTHPASTE   [place]- Preparing For Surgery  Before surgery, you can play an important role. Because skin is not sterile, your skin needs to be as free of germs as possible. You can reduce the number of germs on your skin by washing with CHG (chlorahexidine gluconate) Soap before surgery.  CHG is an antiseptic cleaner which kills germs and bonds with the skin to continue killing germs even after washing.     Please do not use if you have an allergy to CHG or antibacterial soaps. If your skin becomes reddened/irritated stop using the CHG.  Do not shave (including legs and underarms) for at least 48 hours prior to first CHG shower. It is OK to shave your face.  Please follow these instructions carefully.     Shower the NIGHT BEFORE SURGERY and the MORNING OF SURGERY with CHG Soap.  If you chose to wash your hair,  wash your hair first as usual with your normal shampoo. After you shampoo, rinse your hair and body thoroughly to remove the shampoo.  Then [employer] and genitals (private parts) with your normal soap and rinse thoroughly to remove soap.  After that Use CHG Soap as you would any other liquid soap. You can apply CHG directly to the skin and wash gently with a scrungie or a clean washcloth.   Apply the CHG Soap to your body ONLY FROM THE NECK DOWN.  Do not use on open wounds or open sores. Avoid contact with your eyes, ears, mouth and genitals (private parts). Wash Face  and genitals (private parts)  with your normal soap.   Wash thoroughly, paying special attention to the area where your surgery will be performed.  Thoroughly rinse your body with warm water from the neck down.  DO NOT shower/wash with your normal soap after using and rinsing off the CHG Soap.  Pat yourself dry with a CLEAN TOWEL.  Wear CLEAN PAJAMAS to bed the night before surgery  Place CLEAN SHEETS on your bed the night before your surgery  DO NOT SLEEP WITH PETS.   Day of Surgery:  Take a shower with CHG soap. Wear Clean/Comfortable clothing the morning of surgery Remember to brush your teeth WITH YOUR REGULAR TOOTHPASTE. Do not wear jewelry or makeup. Do not wear lotions, powders, perfumes or deodorant. Do not shave 48 hours prior to surgery. Do not bring valuables to the hospital. [facility] is not responsible for any belongings or valuables.  Do not wear nail polish, gel polish, artificial nails, or any other type of covering on natural nails (fingers and toes) If you have artificial nails or gel coating that need to be removed by a nail salon, please have this removed prior to surgery. Artificial nails or gel coating may interfere with anesthesia's ability to adequately monitor your vital signs.   If you received a COVID test during your pre-op visit, it is requested that you wear a mask when out in public, stay away from anyone that may not be feeling well, and notify your surgeon if you develop symptoms. If you have been in contact with anyone that has tested positive in the last 10 days, please notify your surgeon.    Please read over the following fact sheets that you were given.

## 2022-01-10 ENCOUNTER — Encounter (HOSPITAL_COMMUNITY): Payer: Self-pay

## 2022-01-10 ENCOUNTER — Ambulatory Visit (HOSPITAL_COMMUNITY)
Admission: RE | Admit: 2022-01-10 | Discharge: 2022-01-10 | Disposition: A | Discharge status: Home / Self Care | Payer: No Typology Code available for payment source | Source: Ambulatory Visit | Attending: Thoracic Surgery (Cardiothoracic Vascular Surgery) | Admitting: Thoracic Surgery (Cardiothoracic Vascular Surgery)

## 2022-01-10 ENCOUNTER — Encounter (HOSPITAL_COMMUNITY)
Admission: RE | Admit: 2022-01-10 | Discharge: 2022-01-10 | Disposition: A | Discharge status: Home / Self Care | Payer: No Typology Code available for payment source | Source: Ambulatory Visit | Attending: Thoracic Surgery (Cardiothoracic Vascular Surgery) | Admitting: Thoracic Surgery (Cardiothoracic Vascular Surgery)

## 2022-01-10 ENCOUNTER — Other Ambulatory Visit: Payer: Self-pay

## 2022-01-10 VITALS — BP 154/66 | HR 73 | Temp 97.8°F | Resp 18 | Ht 62.0 in | Wt 187.3 lb

## 2022-01-10 DIAGNOSIS — I700 Atherosclerosis of aorta: Secondary | ICD-10-CM

## 2022-01-10 DIAGNOSIS — D62 Acute posthemorrhagic anemia: Secondary | ICD-10-CM | POA: Diagnosis not present

## 2022-01-10 DIAGNOSIS — M1990 Unspecified osteoarthritis, unspecified site: Secondary | ICD-10-CM | POA: Diagnosis not present

## 2022-01-10 DIAGNOSIS — I351 Nonrheumatic aortic (valve) insufficiency: Secondary | ICD-10-CM | POA: Insufficient documentation

## 2022-01-10 DIAGNOSIS — Z96651 Presence of right artificial knee joint: Secondary | ICD-10-CM | POA: Diagnosis not present

## 2022-01-10 DIAGNOSIS — R918 Other nonspecific abnormal finding of lung field: Secondary | ICD-10-CM | POA: Insufficient documentation

## 2022-01-10 DIAGNOSIS — I440 Atrioventricular block, first degree: Secondary | ICD-10-CM | POA: Diagnosis not present

## 2022-01-10 DIAGNOSIS — Z8249 Family history of ischemic heart disease and other diseases of the circulatory system: Secondary | ICD-10-CM | POA: Diagnosis not present

## 2022-01-10 DIAGNOSIS — I2584 Coronary atherosclerosis due to calcified coronary lesion: Secondary | ICD-10-CM | POA: Diagnosis not present

## 2022-01-10 DIAGNOSIS — I2510 Atherosclerotic heart disease of native coronary artery without angina pectoris: Secondary | ICD-10-CM | POA: Diagnosis not present

## 2022-01-10 DIAGNOSIS — R9431 Abnormal electrocardiogram [ECG] [EKG]: Secondary | ICD-10-CM | POA: Insufficient documentation

## 2022-01-10 DIAGNOSIS — R911 Solitary pulmonary nodule: Secondary | ICD-10-CM | POA: Insufficient documentation

## 2022-01-10 DIAGNOSIS — Z01818 Encounter for other preprocedural examination: Secondary | ICD-10-CM

## 2022-01-10 DIAGNOSIS — Z1152 Encounter for screening for COVID-19: Secondary | ICD-10-CM | POA: Insufficient documentation

## 2022-01-10 DIAGNOSIS — J984 Other disorders of lung: Secondary | ICD-10-CM | POA: Insufficient documentation

## 2022-01-10 DIAGNOSIS — J90 Pleural effusion, not elsewhere classified: Secondary | ICD-10-CM | POA: Diagnosis not present

## 2022-01-10 DIAGNOSIS — E785 Hyperlipidemia, unspecified: Secondary | ICD-10-CM | POA: Diagnosis not present

## 2022-01-10 DIAGNOSIS — I4510 Unspecified right bundle-branch block: Secondary | ICD-10-CM | POA: Diagnosis not present

## 2022-01-10 DIAGNOSIS — K760 Fatty (change of) liver, not elsewhere classified: Secondary | ICD-10-CM | POA: Insufficient documentation

## 2022-01-10 DIAGNOSIS — C7A090 Malignant carcinoid tumor of the bronchus and lung: Secondary | ICD-10-CM | POA: Diagnosis not present

## 2022-01-10 HISTORY — DX: Fatty (change of) liver, not elsewhere classified: K76.0

## 2022-01-10 HISTORY — DX: Family history of other specified conditions: Z84.89

## 2022-01-10 HISTORY — DX: Personal history of urinary calculi: Z87.442

## 2022-01-10 LAB — BLOOD GAS, ARTERIAL
Acid-Base Excess: 5 mmol/L — ABNORMAL HIGH (ref 0.0–2.0)
Bicarbonate: 29.1 mmol/L — ABNORMAL HIGH (ref 20.0–28.0)
Drawn by: 6643
O2 Saturation: 97.7 %
Patient temperature: 37
pCO2 arterial: 40 mmHg (ref 32–48)
pH, Arterial: 7.47 — ABNORMAL HIGH (ref 7.35–7.45)
pO2, Arterial: 91 mmHg (ref 83–108)

## 2022-01-10 LAB — PULMONARY FUNCTION TEST
DL/VA % pred: 98 %
DL/VA: 4.12 ml/min/mmHg/L
DLCO unc % pred: 91 %
DLCO unc: 16.29 ml/min/mmHg
FEF 25-75 Post: 2.43 L/sec
FEF 25-75 Pre: 2.09 L/sec
FEF2575-%Change-Post: 16 %
FEF2575-%Pred-Post: 157 %
FEF2575-%Pred-Pre: 135 %
FEV1-%Change-Post: 2 %
FEV1-%Pred-Post: 104 %
FEV1-%Pred-Pre: 102 %
FEV1-Post: 2 L
FEV1-Pre: 1.96 L
FEV1FVC-%Change-Post: 3 %
FEV1FVC-%Pred-Pre: 109 %
FEV6-%Change-Post: -1 %
FEV6-%Pred-Post: 96 %
FEV6-%Pred-Pre: 98 %
FEV6-Post: 2.36 L
FEV6-Pre: 2.4 L
FEV6FVC-%Change-Post: 0 %
FEV6FVC-%Pred-Post: 104 %
FEV6FVC-%Pred-Pre: 105 %
FVC-%Change-Post: -1 %
FVC-%Pred-Post: 92 %
FVC-%Pred-Pre: 93 %
FVC-Post: 2.38 L
FVC-Pre: 2.4 L
Post FEV1/FVC ratio: 84 %
Post FEV6/FVC ratio: 99 %
Pre FEV1/FVC ratio: 82 %
Pre FEV6/FVC Ratio: 100 %
RV % pred: 91 %
RV: 2 L
TLC % pred: 90 %
TLC: 4.32 L

## 2022-01-10 LAB — URINALYSIS, ROUTINE W REFLEX MICROSCOPIC
Bilirubin Urine: NEGATIVE
Glucose, UA: NEGATIVE mg/dL
Hgb urine dipstick: NEGATIVE
Ketones, ur: NEGATIVE mg/dL
Leukocytes,Ua: NEGATIVE
Nitrite: NEGATIVE
Protein, ur: NEGATIVE mg/dL
Specific Gravity, Urine: 1.008 (ref 1.005–1.030)
pH: 7 (ref 5.0–8.0)

## 2022-01-10 LAB — CBC
HCT: 41 % (ref 36.0–46.0)
Hemoglobin: 13.5 g/dL (ref 12.0–15.0)
MCH: 29.7 pg (ref 26.0–34.0)
MCHC: 32.9 g/dL (ref 30.0–36.0)
MCV: 90.3 fL (ref 80.0–100.0)
Platelets: 273 10*3/uL (ref 150–400)
RBC: 4.54 MIL/uL (ref 3.87–5.11)
RDW: 13 % (ref 11.5–15.5)
WBC: 5.7 10*3/uL (ref 4.0–10.5)
nRBC: 0 % (ref 0.0–0.2)

## 2022-01-10 LAB — COMPREHENSIVE METABOLIC PANEL
ALT: 22 U/L (ref 0–44)
AST: 27 U/L (ref 15–41)
Albumin: 4 g/dL (ref 3.5–5.0)
Alkaline Phosphatase: 63 U/L (ref 38–126)
Anion gap: 9 (ref 5–15)
BUN: 13 mg/dL (ref 8–23)
CO2: 23 mmol/L (ref 22–32)
Calcium: 9.6 mg/dL (ref 8.9–10.3)
Chloride: 107 mmol/L (ref 98–111)
Creatinine, Ser: 0.6 mg/dL (ref 0.44–1.00)
GFR, Estimated: >60 mL/min (ref >60)
Glucose, Bld: 107 mg/dL — ABNORMAL HIGH (ref 70–99)
Potassium: 4.1 mmol/L (ref 3.5–5.1)
Sodium: 139 mmol/L (ref 135–145)
Total Bilirubin: 0.5 mg/dL (ref 0.3–1.2)
Total Protein: 7.2 g/dL (ref 6.5–8.1)

## 2022-01-10 LAB — PROTIME-INR
INR: 1 (ref 0.8–1.2)
Prothrombin Time: 12.8 seconds (ref 11.4–15.2)

## 2022-01-10 LAB — APTT: aPTT: 30 seconds (ref 24–36)

## 2022-01-10 LAB — TYPE AND SCREEN
ABO/RH(D): O POS
Antibody Screen: NEGATIVE

## 2022-01-10 LAB — SARS CORONAVIRUS 2 (TAT 6-24 HRS): SARS Coronavirus 2: NEGATIVE

## 2022-01-10 LAB — SURGICAL PCR SCREEN
MRSA, PCR: POSITIVE — AB
Staphylococcus aureus: POSITIVE — AB

## 2022-01-10 MED ORDER — ALBUTEROL SULFATE (2.5 MG/3ML) 0.083% IN NEBU
2.5000 mg | INHALATION_SOLUTION | Freq: Once | RESPIRATORY_TRACT | Status: AC
  Administered 2022-01-10: 2.5 mg via RESPIRATORY_TRACT

## 2022-01-10 NOTE — Progress Notes (Signed)
PCP - Dr. Lawerance Cruel Cardiologist - Dr. Raye Sorrow  PPM/ICD - denies   Chest x-ray - 01/10/22 EKG - 01/10/22 Stress Test - 11/15/21 ECHO - 11/12/21 Cardiac Cath - denies  Sleep Study - denies   DM- denies  Last dose of GLP1 agonist-  n/a   Blood Thinner Instructions: n/a Aspirin Instructions: hold day of surgery  ERAS Protcol - no, NPO   COVID TEST- 01/10/22   Anesthesia review: yes, cardiac hx  Patient denies shortness of breath, fever, cough and chest pain at PAT appointment   All instructions explained to the patient, with a verbal understanding of the material. Patient agrees to go over the instructions while at home for a better understanding. Patient also instructed to wear a mask in public after being tested for COVID-19. The opportunity to ask questions was provided.

## 2022-01-11 NOTE — Progress Notes (Signed)
Anesthesia Chart Review:  Recently evaluated by cardiologist Dr. Terri Skains at request of her PCP for elevated coronary calcium score.  Echo 10/2021 showed EF 60 to 65%, grade 1 DD, mild aortic regurgitation.  Stress testing 10/2021 showed normal myocardial perfusion.  Last seen by Dr. Terri Skains 12/16/2021.  Recommend continue exercise and risk factor modification.  Patient incidentally found to have left upper lobe nodule on coronary calcium screening CT.  PET scan showed the nodule to be hypermetabolic.  She is a lifelong non-smoker.  PFTs 01/10/2022 were normal.  Preop labs reviewed, unremarkable.  EKG 01/10/2022: Sinus rhythm first-degree AV block.  Rate 61.  CT chest 09/23/2021: IMPRESSION: There is 1.7 x 1.1 x 1.4 cm noncalcified nodule in the left upper lobe. Possibility of malignant neoplasm is not excluded. Follow-up PET-CT and tissue sampling should be considered. No other discrete lung nodules are seen. No significant lymphadenopathy seen.   Small linear densities in the lower lung fields may suggest scarring or subsegmental atelectasis. Fatty liver.  Echocardiogram 11/12/2021: Normal LV systolic function with visual EF 60-65%. Left ventricle cavity is normal in size. Mild concentric hypertrophy of the left ventricle. Normal global wall motion. Doppler evidence of grade I (impaired) diastolic dysfunction, normal LAP. Calculated EF 65%. Structurally normal trileaflet aortic valve. Mild (Grade I) aortic regurgitation. Structurally normal tricuspid valve with trace regurgitation. No evidence of pulmonary hypertension.   Lexiscan (with Mod Bruce protocol) Nuclear stress test 11/15/21: Myocardial perfusion is normal. Overall LV systolic function is normal without regional wall motion abnormalities. Stress LV EF: 49% which is mildly reduced. Rest EF normal at 61%. Abnormal ECG stress. Peak EKG/ECG demonstrated normal sinus rhythm. Greater than 2 mm horizontal ST depression of the inferior  leads. The heart rate response was consistent with Regadenoson. The blood pressure response was physiologic. No previous exam available for comparison.    Priscilla Taylor [facility] Short Stay Center/Anesthesiology Phone [phone] 01/11/2022 10:00 AM

## 2022-01-11 NOTE — Anesthesia Preprocedure Evaluation (Signed)
Anesthesia Evaluation Anesthesia Physical Anesthesia Plan  ASA:   Anesthesia Plan:    Post-op Pain Management:    Induction:   PONV Risk Score and Plan:   Airway Management Planned:   Additional Equipment:   Intra-op Plan:   Post-operative Plan:   Informed Consent:   Plan Discussed with:   Anesthesia Plan Comments: (PAT note by Karoline Caldwell, PA-C: Recently evaluated by cardiologist Dr. Terri Skains at request of her PCP for elevated coronary calcium score.  Echo 10/2021 showed EF 60 to 65%, grade 1 DD, mild aortic regurgitation.  Stress testing 10/2021 showed normal myocardial perfusion.  Last seen by Dr. Terri Skains 12/16/2021.  Recommend continue exercise and risk factor modification.  Patient incidentally found to have left upper lobe nodule on coronary calcium screening CT.  PET scan showed the nodule to be hypermetabolic.  She is a lifelong non-smoker.  PFTs 01/10/2022 were normal.  Preop labs reviewed, unremarkable.  EKG 01/10/2022: Sinus rhythm first-degree AV block.  Rate 61.  CT chest 09/23/2021: IMPRESSION: There is 1.7 x 1.1 x 1.4 cm noncalcified nodule in the left upper lobe. Possibility of malignant neoplasm is not excluded. Follow-up PET-CT and tissue sampling should be considered. No other discrete lung nodules are seen. No significant lymphadenopathy seen.  Small linear densities in the lower lung fields may suggest scarring or subsegmental atelectasis. Fatty liver.  Echocardiogram 11/12/2021: Normal LV systolic function with visual EF 60-65%. Left ventricle cavity is normal in size. Mild concentric hypertrophy of the left ventricle. Normal global wall motion. Doppler evidence of grade I (impaired) diastolic dysfunction, normal LAP. Calculated EF 65%. Structurally normal trileaflet aortic valve. Mild (Grade I) aortic regurgitation. Structurally normal tricuspid valve with trace regurgitation. No evidence of pulmonary hypertension.  Lexiscan (with Mod Bruce protocol)  Nuclear stress test 11/15/21: Myocardial perfusion is normal. Overall LV systolic function is normal without regional wall motion abnormalities. Stress LV EF: 49% which is mildly reduced. Rest EF normal at 61%. Abnormal ECG stress. Peak EKG/ECG demonstrated normal sinus rhythm. Greater than 2 mm horizontal ST depression of the inferior leads. The heart rate response was consistent with Regadenoson. The blood pressure response was physiologic. No previous exam available for comparison.  )        Anesthesia Quick Evaluation

## 2022-01-12 ENCOUNTER — Inpatient Hospital Stay (HOSPITAL_COMMUNITY): Payer: No Typology Code available for payment source | Admitting: Certified Registered Nurse Anesthetist

## 2022-01-12 ENCOUNTER — Other Ambulatory Visit: Payer: Self-pay

## 2022-01-12 ENCOUNTER — Inpatient Hospital Stay (HOSPITAL_COMMUNITY)
Admission: RE | Admit: 2022-01-12 | Discharge: 2022-01-14 | DRG: 164 | Disposition: A | Discharge status: Home / Self Care | Payer: No Typology Code available for payment source | Attending: Thoracic Surgery (Cardiothoracic Vascular Surgery) | Admitting: Thoracic Surgery (Cardiothoracic Vascular Surgery)

## 2022-01-12 ENCOUNTER — Inpatient Hospital Stay (HOSPITAL_COMMUNITY): Payer: No Typology Code available for payment source | Admitting: Physician Assistant

## 2022-01-12 ENCOUNTER — Encounter (HOSPITAL_COMMUNITY)
Admission: RE | Disposition: A | Discharge status: Home / Self Care | Payer: Self-pay | Source: Home / Self Care | Attending: Thoracic Surgery (Cardiothoracic Vascular Surgery)

## 2022-01-12 ENCOUNTER — Inpatient Hospital Stay (HOSPITAL_COMMUNITY): Payer: No Typology Code available for payment source

## 2022-01-12 ENCOUNTER — Encounter (HOSPITAL_COMMUNITY): Payer: Self-pay | Admitting: Thoracic Surgery (Cardiothoracic Vascular Surgery)

## 2022-01-12 DIAGNOSIS — Z811 Family history of alcohol abuse and dependence: Secondary | ICD-10-CM

## 2022-01-12 DIAGNOSIS — H409 Unspecified glaucoma: Secondary | ICD-10-CM | POA: Diagnosis present

## 2022-01-12 DIAGNOSIS — R7303 Prediabetes: Secondary | ICD-10-CM | POA: Diagnosis not present

## 2022-01-12 DIAGNOSIS — Z1152 Encounter for screening for COVID-19: Secondary | ICD-10-CM

## 2022-01-12 DIAGNOSIS — Z7982 Long term (current) use of aspirin: Secondary | ICD-10-CM | POA: Diagnosis not present

## 2022-01-12 DIAGNOSIS — M5030 Other cervical disc degeneration, unspecified cervical region: Secondary | ICD-10-CM | POA: Diagnosis not present

## 2022-01-12 DIAGNOSIS — D62 Acute posthemorrhagic anemia: Secondary | ICD-10-CM | POA: Diagnosis not present

## 2022-01-12 DIAGNOSIS — I2584 Coronary atherosclerosis due to calcified coronary lesion: Secondary | ICD-10-CM | POA: Diagnosis present

## 2022-01-12 DIAGNOSIS — C7A090 Malignant carcinoid tumor of the bronchus and lung: Secondary | ICD-10-CM | POA: Diagnosis not present

## 2022-01-12 DIAGNOSIS — I440 Atrioventricular block, first degree: Secondary | ICD-10-CM | POA: Diagnosis not present

## 2022-01-12 DIAGNOSIS — E782 Mixed hyperlipidemia: Secondary | ICD-10-CM | POA: Diagnosis present

## 2022-01-12 DIAGNOSIS — Z8249 Family history of ischemic heart disease and other diseases of the circulatory system: Secondary | ICD-10-CM | POA: Diagnosis not present

## 2022-01-12 DIAGNOSIS — Z96651 Presence of right artificial knee joint: Secondary | ICD-10-CM | POA: Diagnosis not present

## 2022-01-12 DIAGNOSIS — E785 Hyperlipidemia, unspecified: Secondary | ICD-10-CM | POA: Diagnosis present

## 2022-01-12 DIAGNOSIS — Z79899 Other long term (current) drug therapy: Secondary | ICD-10-CM

## 2022-01-12 DIAGNOSIS — Z791 Long term (current) use of non-steroidal anti-inflammatories (NSAID): Secondary | ICD-10-CM | POA: Diagnosis not present

## 2022-01-12 DIAGNOSIS — I700 Atherosclerosis of aorta: Secondary | ICD-10-CM | POA: Diagnosis present

## 2022-01-12 DIAGNOSIS — Z902 Acquired absence of lung [part of]: Principal | ICD-10-CM

## 2022-01-12 DIAGNOSIS — C7A1 Malignant poorly differentiated neuroendocrine tumors: Secondary | ICD-10-CM | POA: Diagnosis not present

## 2022-01-12 DIAGNOSIS — Z888 Allergy status to other drugs, medicaments and biological substances status: Secondary | ICD-10-CM

## 2022-01-12 DIAGNOSIS — I898 Other specified noninfective disorders of lymphatic vessels and lymph nodes: Secondary | ICD-10-CM | POA: Diagnosis not present

## 2022-01-12 DIAGNOSIS — J439 Emphysema, unspecified: Secondary | ICD-10-CM | POA: Diagnosis not present

## 2022-01-12 DIAGNOSIS — C3412 Malignant neoplasm of upper lobe, left bronchus or lung: Secondary | ICD-10-CM | POA: Diagnosis not present

## 2022-01-12 DIAGNOSIS — D1432 Benign neoplasm of left bronchus and lung: Secondary | ICD-10-CM | POA: Diagnosis not present

## 2022-01-12 DIAGNOSIS — R911 Solitary pulmonary nodule: Secondary | ICD-10-CM

## 2022-01-12 DIAGNOSIS — I2510 Atherosclerotic heart disease of native coronary artery without angina pectoris: Secondary | ICD-10-CM | POA: Diagnosis present

## 2022-01-12 DIAGNOSIS — J939 Pneumothorax, unspecified: Secondary | ICD-10-CM | POA: Diagnosis not present

## 2022-01-12 DIAGNOSIS — Z880 Allergy status to penicillin: Secondary | ICD-10-CM

## 2022-01-12 DIAGNOSIS — K219 Gastro-esophageal reflux disease without esophagitis: Secondary | ICD-10-CM | POA: Diagnosis present

## 2022-01-12 DIAGNOSIS — M1990 Unspecified osteoarthritis, unspecified site: Secondary | ICD-10-CM | POA: Diagnosis not present

## 2022-01-12 DIAGNOSIS — I4510 Unspecified right bundle-branch block: Secondary | ICD-10-CM | POA: Diagnosis not present

## 2022-01-12 DIAGNOSIS — D3A00 Benign carcinoid tumor of unspecified site: Secondary | ICD-10-CM | POA: Diagnosis not present

## 2022-01-12 DIAGNOSIS — J9811 Atelectasis: Secondary | ICD-10-CM | POA: Diagnosis not present

## 2022-01-12 DIAGNOSIS — Z6372 Alcoholism and drug addiction in family: Secondary | ICD-10-CM | POA: Diagnosis not present

## 2022-01-12 DIAGNOSIS — J811 Chronic pulmonary edema: Secondary | ICD-10-CM | POA: Diagnosis not present

## 2022-01-12 HISTORY — PX: LYMPH NODE DISSECTION: SHX5087

## 2022-01-12 HISTORY — PX: INTERCOSTAL NERVE BLOCK: SHX5021

## 2022-01-12 LAB — POCT I-STAT 7, (LYTES, BLD GAS, ICA,H+H)
Acid-Base Excess: 0 mmol/L (ref 0.0–2.0)
Bicarbonate: 26 mmol/L (ref 20.0–28.0)
Calcium, Ion: 1.23 mmol/L (ref 1.15–1.40)
HCT: 36 % (ref 36.0–46.0)
Hemoglobin: 12.2 g/dL (ref 12.0–15.0)
O2 Saturation: 100 %
Potassium: 4.1 mmol/L (ref 3.5–5.1)
Sodium: 137 mmol/L (ref 135–145)
TCO2: 27 mmol/L (ref 22–32)
pCO2 arterial: 46.8 mmHg (ref 32–48)
pH, Arterial: 7.353 (ref 7.35–7.45)
pO2, Arterial: 521 mmHg — ABNORMAL HIGH (ref 83–108)

## 2022-01-12 SURGERY — WEDGE RESECTION, LUNG, ROBOT-ASSISTED, THORACOSCOPIC
Anesthesia: General | Site: Chest | Laterality: Left

## 2022-01-12 MED ORDER — LATANOPROST 0.005 % OP SOLN
1.0000 [drp] | Freq: Every day | OPHTHALMIC | Status: DC
  Administered 2022-01-12 – 2022-01-13 (×2): 1 [drp] via OPHTHALMIC
  Filled 2022-01-12: qty 2.5

## 2022-01-12 MED ORDER — CITALOPRAM HYDROBROMIDE 20 MG PO TABS
20.0000 mg | ORAL_TABLET | Freq: Every day | ORAL | Status: DC
  Administered 2022-01-13 – 2022-01-14 (×2): 20 mg via ORAL
  Filled 2022-01-12 (×2): qty 1

## 2022-01-12 MED ORDER — ADULT MULTIVITAMIN W/MINERALS CH
1.0000 | ORAL_TABLET | Freq: Every day | ORAL | Status: DC
  Administered 2022-01-13 – 2022-01-14 (×2): 1 via ORAL
  Filled 2022-01-12 (×2): qty 1

## 2022-01-12 MED ORDER — ALPRAZOLAM 0.25 MG PO TABS
0.1250 mg | ORAL_TABLET | Freq: Every day | ORAL | Status: DC
  Administered 2022-01-12 – 2022-01-13 (×2): 0.125 mg via ORAL
  Filled 2022-01-12 (×2): qty 1

## 2022-01-12 MED ORDER — CHLORHEXIDINE GLUCONATE 0.12 % MT SOLN
15.0000 mL | Freq: Once | OROMUCOSAL | Status: AC
  Administered 2022-01-12: 15 mL via OROMUCOSAL
  Filled 2022-01-12: qty 15

## 2022-01-12 MED ORDER — ROCURONIUM BROMIDE 10 MG/ML (PF) SYRINGE
PREFILLED_SYRINGE | INTRAVENOUS | Status: DC | PRN
  Administered 2022-01-12: 80 mg via INTRAVENOUS
  Administered 2022-01-12 (×3): 20 mg via INTRAVENOUS

## 2022-01-12 MED ORDER — FENTANYL CITRATE (PF) 250 MCG/5ML IJ SOLN
INTRAMUSCULAR | Status: DC | PRN
  Administered 2022-01-12: 100 ug via INTRAVENOUS
  Administered 2022-01-12 (×2): 50 ug via INTRAVENOUS

## 2022-01-12 MED ORDER — MAGNESIUM OXIDE -MG SUPPLEMENT 400 (240 MG) MG PO TABS
200.0000 mg | ORAL_TABLET | Freq: Every day | ORAL | Status: DC
  Administered 2022-01-13 – 2022-01-14 (×2): 200 mg via ORAL
  Filled 2022-01-12 (×2): qty 1

## 2022-01-12 MED ORDER — LACTATED RINGERS IV SOLN: INTRAVENOUS | Status: DC | PRN

## 2022-01-12 MED ORDER — BUPIVACAINE HCL (PF) 0.5 % IJ SOLN
INTRAMUSCULAR | Status: AC
  Filled 2022-01-12: qty 30

## 2022-01-12 MED ORDER — PHENYLEPHRINE 80 MCG/ML (10ML) SYRINGE FOR IV PUSH (FOR BLOOD PRESSURE SUPPORT)
PREFILLED_SYRINGE | INTRAVENOUS | Status: DC | PRN
  Administered 2022-01-12 (×3): 160 ug via INTRAVENOUS
  Administered 2022-01-12: 80 ug via INTRAVENOUS
  Administered 2022-01-12: 160 ug via INTRAVENOUS

## 2022-01-12 MED ORDER — ROCURONIUM BROMIDE 10 MG/ML (PF) SYRINGE
PREFILLED_SYRINGE | INTRAVENOUS | Status: AC
  Filled 2022-01-12: qty 20

## 2022-01-12 MED ORDER — MIDAZOLAM HCL 2 MG/2ML IJ SOLN
INTRAMUSCULAR | Status: DC | PRN
  Administered 2022-01-12: 2 mg via INTRAVENOUS

## 2022-01-12 MED ORDER — MIDAZOLAM HCL 2 MG/2ML IJ SOLN
INTRAMUSCULAR | Status: AC
  Filled 2022-01-12: qty 2

## 2022-01-12 MED ORDER — DORZOLAMIDE HCL 2 % OP SOLN
1.0000 [drp] | Freq: Every day | OPHTHALMIC | Status: DC
  Administered 2022-01-13 – 2022-01-14 (×2): 1 [drp] via OPHTHALMIC
  Filled 2022-01-12: qty 10

## 2022-01-12 MED ORDER — ROCURONIUM BROMIDE 10 MG/ML (PF) SYRINGE
PREFILLED_SYRINGE | INTRAVENOUS | Status: AC
  Filled 2022-01-12: qty 10

## 2022-01-12 MED ORDER — OXYCODONE HCL 5 MG PO TABS: 5.0000 mg | ORAL_TABLET | Freq: Once | ORAL | Status: DC | PRN

## 2022-01-12 MED ORDER — SODIUM CHLORIDE 0.45 % IV SOLN: INTRAVENOUS | Status: DC

## 2022-01-12 MED ORDER — KETOROLAC TROMETHAMINE 15 MG/ML IJ SOLN
15.0000 mg | Freq: Four times a day (QID) | INTRAMUSCULAR | Status: DC
  Administered 2022-01-12 – 2022-01-14 (×7): 15 mg via INTRAVENOUS
  Filled 2022-01-12 (×7): qty 1

## 2022-01-12 MED ORDER — LACTATED RINGERS IV SOLN: INTRAVENOUS | Status: DC

## 2022-01-12 MED ORDER — PROPOFOL 10 MG/ML IV BOLUS
INTRAVENOUS | Status: AC
  Filled 2022-01-12: qty 40

## 2022-01-12 MED ORDER — VANCOMYCIN HCL IN DEXTROSE 1-5 GM/200ML-% IV SOLN
1000.0000 mg | INTRAVENOUS | Status: AC
  Administered 2022-01-12: 1000 mg via INTRAVENOUS
  Filled 2022-01-12: qty 200

## 2022-01-12 MED ORDER — DEXAMETHASONE SODIUM PHOSPHATE 10 MG/ML IJ SOLN
INTRAMUSCULAR | Status: DC | PRN
  Administered 2022-01-12: 10 mg via INTRAVENOUS

## 2022-01-12 MED ORDER — SODIUM CHLORIDE FLUSH 0.9 % IV SOLN
INTRAVENOUS | Status: DC | PRN
  Administered 2022-01-12: 100 mL

## 2022-01-12 MED ORDER — ONDANSETRON HCL 4 MG/2ML IJ SOLN
INTRAMUSCULAR | Status: DC | PRN
  Administered 2022-01-12: 4 mg via INTRAVENOUS

## 2022-01-12 MED ORDER — SUGAMMADEX SODIUM 200 MG/2ML IV SOLN
INTRAVENOUS | Status: DC | PRN
  Administered 2022-01-12: 340 mg via INTRAVENOUS

## 2022-01-12 MED ORDER — EPHEDRINE SULFATE-NACL 50-0.9 MG/10ML-% IV SOSY
PREFILLED_SYRINGE | INTRAVENOUS | Status: DC | PRN
  Administered 2022-01-12: 10 mg via INTRAVENOUS

## 2022-01-12 MED ORDER — OXYCODONE HCL 5 MG/5ML PO SOLN: 5.0000 mg | Freq: Once | ORAL | Status: DC | PRN

## 2022-01-12 MED ORDER — ROSUVASTATIN CALCIUM 20 MG PO TABS
20.0000 mg | ORAL_TABLET | Freq: Every day | ORAL | Status: DC
  Administered 2022-01-12 – 2022-01-14 (×3): 20 mg via ORAL
  Filled 2022-01-12 (×3): qty 1

## 2022-01-12 MED ORDER — PANTOPRAZOLE SODIUM 40 MG PO TBEC
40.0000 mg | DELAYED_RELEASE_TABLET | Freq: Every day | ORAL | Status: DC
  Administered 2022-01-13 – 2022-01-14 (×2): 40 mg via ORAL
  Filled 2022-01-12 (×2): qty 1

## 2022-01-12 MED ORDER — SENNOSIDES-DOCUSATE SODIUM 8.6-50 MG PO TABS
1.0000 | ORAL_TABLET | Freq: Every day | ORAL | Status: DC
  Administered 2022-01-12: 1 via ORAL
  Filled 2022-01-12 (×2): qty 1

## 2022-01-12 MED ORDER — LIDOCAINE 2% (20 MG/ML) 5 ML SYRINGE
INTRAMUSCULAR | Status: DC | PRN
  Administered 2022-01-12: 100 mg via INTRAVENOUS

## 2022-01-12 MED ORDER — ONDANSETRON HCL 4 MG/2ML IJ SOLN: 4.0000 mg | Freq: Once | INTRAMUSCULAR | Status: DC | PRN

## 2022-01-12 MED ORDER — TRAMADOL HCL 50 MG PO TABS: 50.0000 mg | ORAL_TABLET | Freq: Four times a day (QID) | ORAL | Status: DC | PRN

## 2022-01-12 MED ORDER — ACETAMINOPHEN 160 MG/5ML PO SOLN: 1000.0000 mg | Freq: Four times a day (QID) | ORAL | Status: DC

## 2022-01-12 MED ORDER — ENOXAPARIN SODIUM 40 MG/0.4ML IJ SOSY
40.0000 mg | PREFILLED_SYRINGE | Freq: Every day | INTRAMUSCULAR | Status: DC
  Administered 2022-01-13 – 2022-01-14 (×3): 40 mg via SUBCUTANEOUS
  Filled 2022-01-12 (×3): qty 0.4

## 2022-01-12 MED ORDER — BISACODYL 5 MG PO TBEC
10.0000 mg | DELAYED_RELEASE_TABLET | Freq: Every day | ORAL | Status: DC
  Administered 2022-01-12: 10 mg via ORAL
  Filled 2022-01-12 (×3): qty 2

## 2022-01-12 MED ORDER — HYDROMORPHONE HCL 1 MG/ML IJ SOLN: 0.2500 mg | INTRAMUSCULAR | Status: DC | PRN

## 2022-01-12 MED ORDER — ASPIRIN 81 MG PO TBEC
81.0000 mg | DELAYED_RELEASE_TABLET | Freq: Every day | ORAL | Status: DC
  Administered 2022-01-13 – 2022-01-14 (×2): 81 mg via ORAL
  Filled 2022-01-12 (×2): qty 1

## 2022-01-12 MED ORDER — SODIUM CHLORIDE 0.9 % IV SOLN
INTRAVENOUS | Status: AC | PRN
  Administered 2022-01-12: 1000 mL

## 2022-01-12 MED ORDER — 0.9 % SODIUM CHLORIDE (POUR BTL) OPTIME
TOPICAL | Status: DC | PRN
  Administered 2022-01-12: 2000 mL

## 2022-01-12 MED ORDER — ACETAMINOPHEN 500 MG PO TABS
1000.0000 mg | ORAL_TABLET | Freq: Four times a day (QID) | ORAL | Status: DC
  Administered 2022-01-12 – 2022-01-14 (×7): 1000 mg via ORAL
  Filled 2022-01-12 (×7): qty 2

## 2022-01-12 MED ORDER — BUPIVACAINE LIPOSOME 1.3 % IJ SUSP
INTRAMUSCULAR | Status: AC
  Filled 2022-01-12: qty 20

## 2022-01-12 MED ORDER — ACETAMINOPHEN 10 MG/ML IV SOLN: 1000.0000 mg | Freq: Once | INTRAVENOUS | Status: DC | PRN

## 2022-01-12 MED ORDER — MAGNESIUM 250 MG PO TABS: 250.0000 mg | ORAL_TABLET | Freq: Every day | ORAL | Status: DC

## 2022-01-12 MED ORDER — LABETALOL HCL 5 MG/ML IV SOLN
INTRAVENOUS | Status: DC | PRN
  Administered 2022-01-12: 5 mg via INTRAVENOUS

## 2022-01-12 MED ORDER — TIMOLOL MALEATE 0.5 % OP SOLN
1.0000 [drp] | Freq: Every day | OPHTHALMIC | Status: DC
  Administered 2022-01-13 – 2022-01-14 (×2): 1 [drp] via OPHTHALMIC
  Filled 2022-01-12: qty 5

## 2022-01-12 MED ORDER — ORAL CARE MOUTH RINSE: 15.0000 mL | Freq: Once | OROMUCOSAL | Status: AC

## 2022-01-12 MED ORDER — MORPHINE SULFATE (PF) 2 MG/ML IV SOLN: 2.0000 mg | INTRAVENOUS | Status: DC | PRN

## 2022-01-12 MED ORDER — FENTANYL CITRATE (PF) 250 MCG/5ML IJ SOLN
INTRAMUSCULAR | Status: AC
  Filled 2022-01-12: qty 5

## 2022-01-12 MED ORDER — CEFAZOLIN SODIUM-DEXTROSE 2-4 GM/100ML-% IV SOLN
2.0000 g | Freq: Three times a day (TID) | INTRAVENOUS | Status: AC
  Administered 2022-01-12 – 2022-01-13 (×2): 2 g via INTRAVENOUS
  Filled 2022-01-12 (×2): qty 100

## 2022-01-12 MED ORDER — PHENYLEPHRINE HCL-NACL 20-0.9 MG/250ML-% IV SOLN
INTRAVENOUS | Status: DC | PRN
  Administered 2022-01-12: 30 ug/min via INTRAVENOUS

## 2022-01-12 MED ORDER — VASOPRESSIN 20 UNIT/ML IV SOLN
INTRAVENOUS | Status: AC
  Filled 2022-01-12: qty 1

## 2022-01-12 MED ORDER — ONDANSETRON HCL 4 MG/2ML IJ SOLN: 4.0000 mg | Freq: Four times a day (QID) | INTRAMUSCULAR | Status: DC | PRN

## 2022-01-12 MED ORDER — OXYCODONE HCL 5 MG PO TABS
5.0000 mg | ORAL_TABLET | ORAL | Status: DC | PRN
  Administered 2022-01-12: 5 mg via ORAL
  Administered 2022-01-12: 10 mg via ORAL
  Administered 2022-01-14: 5 mg via ORAL
  Filled 2022-01-12: qty 1
  Filled 2022-01-12 (×2): qty 2

## 2022-01-12 MED ORDER — PROPOFOL 10 MG/ML IV BOLUS
INTRAVENOUS | Status: DC | PRN
  Administered 2022-01-12: 200 mg via INTRAVENOUS

## 2022-01-12 SURGICAL SUPPLY — 102 items
ADH SKN CLS LQ APL DERMABOND (GAUZE/BANDAGES/DRESSINGS) ×1
APPLIER CLIP ROT 10 11.4 M/L (STAPLE)
APR CLP MED LRG 11.4X10 (STAPLE)
BAG SPEC RTRVL C1550 15 (MISCELLANEOUS) ×1
BLADE CLIPPER SURG (BLADE) ×1 IMPLANT
BNDG COHESIVE 6X5 TAN STRL LF (GAUZE/BANDAGES/DRESSINGS) IMPLANT
CANISTER SUCT 3000ML PPV (MISCELLANEOUS) ×2 IMPLANT
CANNULA REDUC XI 12-8 STAPL (CANNULA) ×2
CANNULA REDUCER 12-8 DVNC XI (CANNULA) ×2 IMPLANT
CLIP APPLIE ROT 10 11.4 M/L (STAPLE) IMPLANT
CLIP VESOCCLUDE MED 6/CT (CLIP) IMPLANT
CNTNR URN SCR LID CUP LEK RST (MISCELLANEOUS) ×5 IMPLANT
CONN ST 1/4X3/8  BEN (MISCELLANEOUS)
CONN ST 1/4X3/8 BEN (MISCELLANEOUS) IMPLANT
CONT SPEC 4OZ STRL OR WHT (MISCELLANEOUS) ×15
DEFOGGER SCOPE WARMER CLEARIFY (MISCELLANEOUS) ×1 IMPLANT
DERMABOND ADVANCED .7 DNX6 (GAUZE/BANDAGES/DRESSINGS) IMPLANT
DRAIN CHANNEL 28F RND 3/8 FF (WOUND CARE) IMPLANT
DRAIN CHANNEL 32F RND 10.7 FF (WOUND CARE) IMPLANT
DRAPE ARM DVNC X/XI (DISPOSABLE) ×4 IMPLANT
DRAPE COLUMN DVNC XI (DISPOSABLE) ×1 IMPLANT
DRAPE CV SPLIT W-CLR ANES SCRN (DRAPES) ×1 IMPLANT
DRAPE DA VINCI XI ARM (DISPOSABLE) ×4
DRAPE DA VINCI XI COLUMN (DISPOSABLE) ×1
DRAPE HALF SHEET 40X57 (DRAPES) ×1 IMPLANT
DRAPE INCISE IOBAN 66X45 STRL (DRAPES) IMPLANT
DRAPE ORTHO SPLIT 77X108 STRL (DRAPES) ×1
DRAPE SURG ORHT 6 SPLT 77X108 (DRAPES) ×1 IMPLANT
ELECT BLADE 6.5 EXT (BLADE) IMPLANT
ELECT REM PT RETURN 9FT ADLT (ELECTROSURGICAL) ×1
ELECTRODE REM PT RTRN 9FT ADLT (ELECTROSURGICAL) ×1 IMPLANT
GAUZE KITTNER 4X5 RF (MISCELLANEOUS) ×2 IMPLANT
GAUZE SPONGE 4X4 12PLY STRL (GAUZE/BANDAGES/DRESSINGS) ×1 IMPLANT
GLOVE SS BIOGEL STRL SZ 7.5 (GLOVE) ×1 IMPLANT
GOWN STRL REUS W/ TWL LRG LVL3 (GOWN DISPOSABLE) ×2 IMPLANT
GOWN STRL REUS W/ TWL XL LVL3 (GOWN DISPOSABLE) ×2 IMPLANT
GOWN STRL REUS W/TWL 2XL LVL3 (GOWN DISPOSABLE) ×1 IMPLANT
GOWN STRL REUS W/TWL LRG LVL3 (GOWN DISPOSABLE) ×2
GOWN STRL REUS W/TWL XL LVL3 (GOWN DISPOSABLE) ×2
HEMOSTAT SURGICEL 2X14 (HEMOSTASIS) ×3 IMPLANT
IRRIGATION STRYKERFLOW (MISCELLANEOUS) ×1 IMPLANT
IRRIGATOR STRYKERFLOW (MISCELLANEOUS) ×2
KIT BASIN OR (CUSTOM PROCEDURE TRAY) ×1 IMPLANT
KIT SUCTION CATH 14FR (SUCTIONS) IMPLANT
KIT TURNOVER KIT B (KITS) ×1 IMPLANT
NDL HYPO 25GX1X1/2 BEV (NEEDLE) ×1 IMPLANT
NDL SPNL 22GX3.5 QUINCKE BK (NEEDLE) ×1 IMPLANT
NEEDLE HYPO 25GX1X1/2 BEV (NEEDLE) ×1 IMPLANT
NEEDLE SPNL 22GX3.5 QUINCKE BK (NEEDLE) ×1 IMPLANT
NS IRRIG 1000ML POUR BTL (IV SOLUTION) ×1 IMPLANT
PACK CHEST (CUSTOM PROCEDURE TRAY) ×1 IMPLANT
PAD ARMBOARD 7.5X6 YLW CONV (MISCELLANEOUS) ×2 IMPLANT
PORT ACCESS TROCAR AIRSEAL 12 (TROCAR) IMPLANT
PORT ACCESS TROCAR AIRSEAL 5M (TROCAR) ×1
RELOAD STAPLE 45 2.5 WHT DVNC (STAPLE) IMPLANT
RELOAD STAPLE 45 3.5 BLU DVNC (STAPLE) IMPLANT
RELOAD STAPLE 45 4.3 GRN DVNC (STAPLE) IMPLANT
RELOAD STAPLER 2.5X45 WHT DVNC (STAPLE) ×6 IMPLANT
RELOAD STAPLER 3.5X45 BLU DVNC (STAPLE) ×3 IMPLANT
RELOAD STAPLER 4.3X45 GRN DVNC (STAPLE) ×2 IMPLANT
SCISSORS LAP 5X35 DISP (ENDOMECHANICALS) IMPLANT
SEAL CANN UNIV 5-8 DVNC XI (MISCELLANEOUS) ×2 IMPLANT
SEAL XI 5MM-8MM UNIVERSAL (MISCELLANEOUS) ×2
SEALANT PROGEL (MISCELLANEOUS) IMPLANT
SET TRI-LUMEN FLTR TB AIRSEAL (TUBING) ×1 IMPLANT
SOLUTION ELECTROLUBE (MISCELLANEOUS) ×1 IMPLANT
SPONGE INTESTINAL PEANUT (DISPOSABLE) IMPLANT
SPONGE TONSIL TAPE 1 RFD (DISPOSABLE) IMPLANT
STAPLER 45 SUREFORM CVD (STAPLE) ×1
STAPLER 45 SUREFORM CVD DVNC (STAPLE) IMPLANT
STAPLER CANNULA SEAL DVNC XI (STAPLE) ×2 IMPLANT
STAPLER CANNULA SEAL XI (STAPLE) ×2
STAPLER RELOAD 2.5X45 WHITE (STAPLE) ×6
STAPLER RELOAD 2.5X45 WHT DVNC (STAPLE) ×6
STAPLER RELOAD 3.5X45 BLU DVNC (STAPLE) ×3
STAPLER RELOAD 3.5X45 BLUE (STAPLE) ×3
STAPLER RELOAD 4.3X45 GREEN (STAPLE) ×2
STAPLER RELOAD 4.3X45 GRN DVNC (STAPLE) ×2
SUT PDS AB 3-0 SH 27 (SUTURE) IMPLANT
SUT PROLENE 4 0 RB 1 (SUTURE)
SUT PROLENE 4-0 RB1 .5 CRCL 36 (SUTURE) IMPLANT
SUT SILK  1 MH (SUTURE) ×2
SUT SILK 1 MH (SUTURE) ×2 IMPLANT
SUT SILK 2 0 SH (SUTURE) IMPLANT
SUT SILK 2 0SH CR/8 30 (SUTURE) IMPLANT
SUT SILK 3 0SH CR/8 30 (SUTURE) IMPLANT
SUT VIC AB 1 CTX 36 (SUTURE) ×1
SUT VIC AB 1 CTX36XBRD ANBCTR (SUTURE) IMPLANT
SUT VIC AB 2-0 CTX 36 (SUTURE) IMPLANT
SUT VIC AB 3-0 X1 27 (SUTURE) ×2 IMPLANT
SUT VICRYL 0 TIES 12 18 (SUTURE) ×1 IMPLANT
SUT VICRYL 0 UR6 27IN ABS (SUTURE) ×2 IMPLANT
SYR 20ML LL LF (SYRINGE) ×2 IMPLANT
SYSTEM RETRIEVAL ANCHOR 15 (MISCELLANEOUS) IMPLANT
SYSTEM SAHARA CHEST DRAIN ATS (WOUND CARE) ×1 IMPLANT
TAPE CLOTH 4X10 WHT NS (GAUZE/BANDAGES/DRESSINGS) ×1 IMPLANT
TAPE CLOTH SURG 4X10 WHT LF (GAUZE/BANDAGES/DRESSINGS) IMPLANT
TIP APPLICATOR SPRAY EXTEND 16 (VASCULAR PRODUCTS) IMPLANT
TOWEL GREEN STERILE (TOWEL DISPOSABLE) ×2 IMPLANT
TRAY FOLEY MTR SLVR 16FR STAT (SET/KITS/TRAYS/PACK) ×1 IMPLANT
TRAY FOLEY W/BAG SLVR 14FR (SET/KITS/TRAYS/PACK) IMPLANT
WATER STERILE IRR 1000ML POUR (IV SOLUTION) ×1 IMPLANT

## 2022-01-12 NOTE — Anesthesia Procedure Notes (Signed)
Procedure Name: Intubation Date/Time: 01/12/2022 9:01 AM  Performed by: Leonor Liv, CRNAPre-anesthesia Checklist: Patient identified, Emergency Drugs available, Suction available and Patient being monitored Patient Re-evaluated:Patient Re-evaluated prior to induction Oxygen Delivery Method: Circle System Utilized Preoxygenation: Pre-oxygenation with 100% oxygen Induction Type: IV induction Ventilation: Mask ventilation without difficulty and Oral airway inserted - appropriate to patient size Laryngoscope Size: Mac and 3 Grade View: Grade I Tube type: Oral Endobronchial tube: Left, Double lumen EBT, EBT position confirmed by auscultation and EBT position confirmed by fiberoptic bronchoscope and 37 Fr Number of attempts: 1 Airway Equipment and Method: Stylet and Oral airway Placement Confirmation: ETT inserted through vocal cords under direct vision, positive ETCO2 and breath sounds checked- equal and bilateral Secured at: 28 cm Tube secured with: Tape Dental Injury: Teeth and Oropharynx as per pre-operative assessment  Comments: Shoulder roll placed prior to intubation. Masking improved with oral airway

## 2022-01-12 NOTE — Discharge Summary (Addendum)
Physician Discharge Summary  Patient ID: Priscilla Taylor MRN: [id number] DOB/AGE: 10-06-1945 76 y.o.  Admit date: 01/12/2022 Discharge date: 01/14/2022  Admission Diagnoses:Left upper lobe lung nodule  Patient Active Problem List   Diagnosis Date Noted   Abnormal CT scan 11/04/2021   Aortic atherosclerosis ([place]) 11/04/2021   Back pain 11/04/2021   Eczema 11/04/2021   Family history of high cholesterol 11/04/2021   Generalized anxiety disorder 11/04/2021   High risk medication use 11/04/2021   OA (osteoarthritis) of finger 11/04/2021   Mixed hyperlipidemia 11/04/2021   Lung nodule seen on imaging study 11/04/2021   Urinary urgency 11/04/2021   Arthritis 11/04/2021   Spinal stenosis 11/04/2021   Prediabetes 11/04/2021   Other specified disorders of bone density and structure, other site 11/04/2021   Obesity, Class I, BMI 30-34.9 11/04/2021   Glaucoma (increased eye pressure) 07/27/2021   Neck pain 04/12/2021   DDD (degenerative disc disease), cervical 02/23/2021   Presence of artificial knee joint 07/26/2012   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,N0)  Patient Active Problem List   Diagnosis Date Noted   S/P Robotic Assisted Video Thoracoscopy wtih Left Upper Lobectomy 01/12/2022   Abnormal CT scan 11/04/2021   Aortic atherosclerosis ([place]) 11/04/2021   Back pain 11/04/2021   Eczema 11/04/2021   Family history of high cholesterol 11/04/2021   Generalized anxiety disorder 11/04/2021   High risk medication use 11/04/2021   OA (osteoarthritis) of finger 11/04/2021   Mixed hyperlipidemia 11/04/2021   Lung nodule seen on imaging study 11/04/2021   Urinary urgency 11/04/2021   Arthritis 11/04/2021   Spinal stenosis 11/04/2021   Prediabetes 11/04/2021   Other specified disorders of bone density and structure, other site 11/04/2021   Obesity, Class I, BMI 30-34.9 11/04/2021   Glaucoma (increased eye pressure) 07/27/2021   Neck  pain 04/12/2021   DDD (degenerative disc disease), cervical 02/23/2021   Presence of artificial knee joint 07/26/2012   Discharged Condition: stable  History of Present Illness:  Priscilla Taylor is a 76 year old woman with a history of CAD, hyperlipidemia, reflux, and arthritis.  She is a lifelong non-smoker.  She had a CT for coronary calcium screening done earlier this year.  It showed some moderate CAD.  It also showed a left upper lobe lung nodule.  That led to a CT of the chest which showed a 1.7 x 1.4 x 1.1 cm left upper lobe nodule.  There is no mediastinal or hilar adenopathy.  A PET/CT showed the nodule was markedly hypermetabolic with an SUV of 6.1.  Again there was no evidence of regional or distant metastatic disease.   She did see cardiology.  An echocardiogram showed preserved left ventricular function with mild AI.  A stress test showed some mild ST depression in inferior leads.  She saw cardiology and had some medication adjustments.   She denies any chest pain, pressure, tightness, shortness of breath, wheezing.  She does have an occasional cough.  No fevers, chills, or sweats.  Is fairly active and walks at least a mile 3 times a week and then also walks on other days as well.  No change in appetite or weight loss.  She was evaluated by Dr. Roxan Hockey who recommended surgical resection of the nodule.  The risks and benefits of the procedure were explained to the patient and she was agreeable to proceed.  Hospital Course:  [name] presented to [facility] on 01/12/2022.  She was taken to the operating  room and underwent Robotic Assisted Left Upper Lobectomy, Lymph Node Dissection, Intercostal Nerve block.  She tolerated the procedure without difficulty, was extubated and taken to PACU in stable condition. A line was removed. Foley was removed and IVF were decreased on post op day one. She had left shoulder pain which was likely from the chest tube and surgery. Chest  tube was to water seal and there was no air leak. Chest x ray showed a decreased (trace left apical) left pneumothorax. Chest tube was removed. Follow up chest x ray showed no pneumothorax. Patient initially required a few liters of oxygen but was weaned to room air. She is tolerating a diet. All wounds are clean, dry, healing without signs of infection. PA/LAT CXR on 10/20 showed **. She is felt surgically stable for discharge today.  Consults: None  Significant Diagnostic Studies: nuclear medicine:  TECHNIQUE: 9.2 mCi F-18 FDG was injected intravenously. Full-ring PET imaging was performed from the skull base to thigh after the radiotracer. CT data was obtained and used for attenuation correction and anatomic localization.   Fasting blood glucose: 102 mg/dl   COMPARISON:  Chest CT 09/23/2021   FINDINGS: Mediastinal blood pool activity: SUV max 2.4   Liver activity: SUV max NA   NECK: Mild asymmetry of the palatine tonsillar tissue, maximum SUV 5.0 on the right and 3.1 on the left, without obvious CT abnormality. While likely incidental less is technically nonspecific.   Incidental CT findings: none   CHEST: 1.6 by 1.2 cm left upper lobe nodule on image 25 series 7 has a maximum SUV of 6.1, most compatible with malignancy. No hypermetabolic adenopathy observed.   Incidental CT findings: Coronary, aortic arch, and branch vessel atherosclerotic vascular disease. Mild cardiomegaly.   ABDOMEN/PELVIS: No significant abnormal hypermetabolic activity in this region.   Incidental CT findings: Atherosclerosis is present, including aortoiliac atherosclerotic disease. Photopenic simple appearing right ovarian cyst 3.6 by 3.1 cm on image 161 series 4.   SKELETON: No significant abnormal hypermetabolic activity in this region.   Incidental CT findings: Mild dextroconvex lumbar rotary scoliosis. Thoracolumbar spondylosis especially in the lower lumbar spine.   IMPRESSION: 1.  Hypermetabolic 1.6 cm left upper lobe nodule, maximum SUV 6.1, most compatible with malignancy. No findings of metastatic spread. 2. Right ovarian photopenic cystic lesion 3.6 cm in long axis. Recommend follow-up US in 6-12 months. Note: This recommendation does not apply to premenarchal patients and to those with increased risk (genetic, family history, elevated tumor markers or other high-risk factors) of ovarian cancer. Reference: JACR 2020 Feb; 17(2):248-254 3. Other imaging findings of potential clinical significance: Aortic Atherosclerosis (ICD10-I70.0). Coronary and systemic atherosclerosis. Mild cardiomegaly. Mild lumbar scoliosis. Thoracolumbar spondylosis.     Electronically Signed   By: Van Clines M.D.   On: 10/21/2021 07:19 Same Day Repeat CXR: CLINICAL DATA:  Chest tube removal.   EXAM: CHEST - 1 VIEW SAME DAY   COMPARISON:  One-view chest x-ray 01/13/2022 at 5:58 a.m.   FINDINGS: Heart is enlarged. Mild pulmonary vascular congestion is present. Left-sided chest tube was removed. No pneumothorax is present.   IMPRESSION: Removal of left-sided chest tube without pneumothorax.     Electronically Signed   By: San Morelle M.D.   On: 01/13/2022 10:39      Narrative & Impression  CLINICAL DATA:  Status post lobectomy   EXAM: CHEST - 2 VIEW   COMPARISON:  Chest x-ray dated January 13, 2022.   FINDINGS: cardiac and mediastinal contours are unchanged. Postsurgical  changes of the left lung. Mild bibasilar opacities, likely due to atelectasis. Mild subcutaneous emphysema at the left-greater-than-right neck bases and along the left chest wall. No pleural effusion or evidence of pneumothorax.   IMPRESSION: 1. Mild bibasilar opacities, likely due to atelectasis. 2. No pleural effusion or evidence of pneumothorax.     Electronically Signed   By: Yetta Glassman M.D.   On: 01/14/2022 08:25   Treatments: surgery:  Xi robotic-assisted left  upper lobectomy, lymph node dissection and intercostal nerve blocks levels 3 through 10 by Dr. Roxan Hockey on 01/12/2022.  PATHOLOGY:Final results pending  Discharge Exam: Blood pressure (!) 116/43, pulse 66, temperature 98.2 F (36.8 C), temperature source Oral, resp. rate 20, height 5\' 2"  (1.575 m), weight 85 kg, SpO2 93 %.   Disposition: ardiovascular: RRR Pulmonary: Mostly clear Abdomen: Soft, non tender, bowel sounds present. Extremities: No LE edema Wounds: Clean and dry.  No erythema or signs of infection. Stable for discharge to home.   Allergies as of 01/14/2022       Reactions   Sulfa Antibiotics Hives   Amoxicillin Other (See Comments)   vaginitis   Lipitor [atorvastatin] Other (See Comments)   ache   Zocor [simvastatin] Other (See Comments)   ache        Medication List     TAKE these medications    ALPRAZolam 0.25 MG tablet Commonly known as: XANAX Take 0.125 mg by mouth at bedtime.   aspirin EC 81 MG tablet Take 1 tablet (81 mg total) by mouth daily. Swallow whole.   citalopram 20 MG tablet Commonly known as: CELEXA Take 20 mg by mouth daily.   dorzolamide 2 % ophthalmic solution Commonly known as: TRUSOPT Place 1 drop into both eyes daily.   latanoprost 0.005 % ophthalmic solution Commonly known as: XALATAN Place 1 drop into both eyes at bedtime.   Magnesium 250 MG Tabs Take 250 mg by mouth daily.   meloxicam 15 MG tablet Commonly known as: MOBIC Take 15 mg by mouth daily.   multivitamin tablet Take 1 tablet by mouth daily.   niacin 500 MG tablet Commonly known as: VITAMIN B3 Take 1,000 mg by mouth daily.   oxyCODONE 5 MG immediate release tablet Commonly known as: Oxy IR/ROXICODONE Take 1 tablet (5 mg total) by mouth every 6 (six) hours as needed for moderate pain.   PreserVision AREDS 2 Caps Take 1 capsule by mouth daily.   PROBIOTIC-10 PO Take 1 capsule by mouth daily.   rosuvastatin 20 MG tablet Commonly known as:  CRESTOR Take 20 mg by mouth daily.   timolol 0.5 % ophthalmic solution Commonly known as: TIMOPTIC Place 1 drop into both eyes daily.   Turmeric 500 MG Caps Take 500 mg by mouth daily.   Vitamin D3 25 MCG (1000 UT) Caps Take 1,000 Units by mouth daily.               Durable Medical Equipment  (From admission, onward)           Start     Ordered   01/14/22 0828  For home use only DME 3 n 1  Once        01/14/22 0827            Follow-up Information     [facility]. Go on 01/25/2022.   Why: Please arrive to have PA/LAT CXR taken at 8:45 am. This x ray needs to be done prior to office appointment with Dr. Ollen Gross information:  [place]        [name], MD. Go on 01/25/2022.   Specialty: Cardiothoracic Surgery Why: Appointment time is at 9:45 am. Contact information: [address] [place] [address] [phone]                 Signed: Arnoldo Lenis 01/14/2022, 12:26 PM

## 2022-01-12 NOTE — Transfer of Care (Signed)
Immediate Anesthesia Transfer of Care Note  Patient: Priscilla Taylor  Procedure(s) Performed: XI ROBOTIC ASSISTED THORACOSCOPY-LEFT UPPER LOBE LOBECTOMY (Left: Chest) INTERCOSTAL NERVE BLOCK (Left: Chest) LYMPH NODE DISSECTION (Left: Chest)  Patient Location: PACU  Anesthesia Type:General  Level of Consciousness: awake and patient cooperative  Airway & Oxygen Therapy: Patient Spontanous Breathing and Patient connected to face mask oxygen  Post-op Assessment: Report given to RN, Post -op Vital signs reviewed and stable and Patient moving all extremities  Post vital signs: Reviewed and stable  Last Vitals:  Vitals Value Taken Time  BP 128/57 01/12/22 1248  Temp 36.1 C 01/12/22 1247  Pulse 56 01/12/22 1259  Resp 16 01/12/22 1259  SpO2 92 % 01/12/22 1259  Vitals shown include unvalidated device data.  Last Pain:  Vitals:   01/12/22 1247  TempSrc:   PainSc: 0-No pain         Complications: No notable events documented.

## 2022-01-12 NOTE — Anesthesia Postprocedure Evaluation (Signed)
Anesthesia Post Note  Patient: Priscilla Taylor  Procedure(s) Performed: XI ROBOTIC ASSISTED THORACOSCOPY-LEFT UPPER LOBE LOBECTOMY (Left: Chest) INTERCOSTAL NERVE BLOCK (Left: Chest) LYMPH NODE DISSECTION (Left: Chest)     Patient location during evaluation: PACU Anesthesia Type: General Level of consciousness: awake and alert Pain management: pain level controlled Vital Signs Assessment: post-procedure vital signs reviewed and stable Respiratory status: spontaneous breathing, nonlabored ventilation, respiratory function stable and patient connected to nasal cannula oxygen Cardiovascular status: blood pressure returned to baseline and stable Postop Assessment: no apparent nausea or vomiting Anesthetic complications: no   No notable events documented.  Last Vitals:  Vitals:   01/12/22 1400 01/12/22 1415  BP: (!) 126/54 130/71  Pulse: 64 64  Resp: 16 16  Temp:    SpO2: 95% 95%    Last Pain:  Vitals:   01/12/22 1345  TempSrc:   PainSc: 2                  Tkai Serfass S

## 2022-01-12 NOTE — Brief Op Note (Addendum)
01/12/2022  12:32 PM  PATIENT:  Priscilla Taylor  76 y.o. female  PRE-OPERATIVE DIAGNOSIS:  LUL NODULE  POST-OPERATIVE DIAGNOSIS:  CARCINOID TUMOR- CLINICAL STAGE IA (T1,N0)  PROCEDURE:  Procedure(s):  XI ROBOTIC ASSISTED THORACOSCOPY-LEFT UPPER LOBE LOBECTOMY (Left) INTERCOSTAL NERVE BLOCK (Left) LYMPH NODE DISSECTION (Left)  SURGEON:  Surgeon(s) and Role:    * [name], MD - Primary  PHYSICIAN ASSISTANT: Ellwood Handler PA-C  ASSISTANTS: none   ANESTHESIA:   general  EBL:  50 mL   BLOOD ADMINISTERED:none  DRAINS:  28 Blake Drain    LOCAL MEDICATIONS USED: Exparel  SPECIMEN:  Source of Specimen:  Left upper lobectomy, lymph nodes  DISPOSITION OF SPECIMEN:  PATHOLOGY  COUNTS:  YES  TOURNIQUET:  * No tourniquets in log *  DICTATION: .Dragon Dictation  PLAN OF CARE: Admit to inpatient   PATIENT DISPOSITION:  PACU - hemodynamically stable.   Delay start of Pharmacological VTE agent (>24hrs) due to surgical blood loss or risk of bleeding: no

## 2022-01-12 NOTE — Progress Notes (Signed)
Chest tube dressing saturated with blood. Dressing reinforced, Md aware, continue to monitor.

## 2022-01-12 NOTE — Anesthesia Procedure Notes (Signed)
Arterial Line Insertion Start/End10/18/2023 7:55 AM, 01/12/2022 8:10 AM Performed by: Leonor Liv, CRNA, CRNA  Patient location: Pre-op. Preanesthetic checklist: patient identified, IV checked, site marked, risks and benefits discussed, surgical consent, monitors and equipment checked, pre-op evaluation, timeout performed and anesthesia consent Lidocaine 1% used for infiltration Right, radial was placed Catheter size: 20 G Hand hygiene performed  and maximum sterile barriers used  Allen's test indicative of satisfactory collateral circulation Attempts: 1 Procedure performed without using ultrasound guided technique. Following insertion, dressing applied and Biopatch. Patient tolerated the procedure well with no immediate complications.

## 2022-01-12 NOTE — Interval H&P Note (Signed)
History and Physical Interval Note: PFTs Ok 01/12/2022 8:34 AM  Priscilla Taylor  has presented today for surgery, with the diagnosis of LUL NODULE.  The various methods of treatment have been discussed with the patient and family. After consideration of risks, benefits and other options for treatment, the patient has consented to  Procedure(s): XI ROBOTIC ASSISTED THORACOSCOPY-LEFT UPPER LOBE WEDGE RESECTION, possible lobectomy (Left) as a surgical intervention.  The patient's history has been reviewed, patient examined, no change in status, stable for surgery.  I have reviewed the patient's chart and labs.  Questions were answered to the patient's satisfaction.     [name]

## 2022-01-12 NOTE — Hospital Course (Signed)
History of Present Illness:  Priscilla Taylor is a 76 year old woman with a history of CAD, hyperlipidemia, reflux, and arthritis.  She is a lifelong non-smoker.  She had a CT for coronary calcium screening done earlier this year.  It showed some moderate CAD.  It also showed a left upper lobe lung nodule.  That led to a CT of the chest which showed a 1.7 x 1.4 x 1.1 cm left upper lobe nodule.  There is no mediastinal or hilar adenopathy.  A PET/CT showed the nodule was markedly hypermetabolic with an SUV of 6.1.  Again there was no evidence of regional or distant metastatic disease.   She did see cardiology.  An echocardiogram showed preserved left ventricular function with mild AI.  A stress test showed some mild ST depression in inferior leads.  She saw cardiology and had some medication adjustments.   She denies any chest pain, pressure, tightness, shortness of breath, wheezing.  She does have an occasional cough.  No fevers, chills, or sweats.  Is fairly active and walks at least a mile 3 times a week and then also walks on other days as well.  No change in appetite or weight loss.  She was evaluated by Dr. Roxan Hockey who recommended surgical resection of the nodule.  The risks and benefits of the procedure were explained to the patient and she was agreeable to proceed.  Hospital Course:  Priscilla Taylor presented to [facility] on 01/12/2022.  She was taken to the operating room and underwent Robotic Assisted Left Upper Lobectomy, Lymph Node Dissection, Intercostal Nerve block.  She tolerated the procedure without difficulty, was extubated and taken to PACU in stable condition.

## 2022-01-13 ENCOUNTER — Inpatient Hospital Stay (HOSPITAL_COMMUNITY): Payer: No Typology Code available for payment source

## 2022-01-13 ENCOUNTER — Encounter (HOSPITAL_COMMUNITY): Payer: Self-pay | Admitting: Thoracic Surgery (Cardiothoracic Vascular Surgery)

## 2022-01-13 DIAGNOSIS — J939 Pneumothorax, unspecified: Secondary | ICD-10-CM | POA: Diagnosis not present

## 2022-01-13 DIAGNOSIS — J811 Chronic pulmonary edema: Secondary | ICD-10-CM | POA: Diagnosis not present

## 2022-01-13 LAB — CBC
HCT: 34.2 % — ABNORMAL LOW (ref 36.0–46.0)
Hemoglobin: 11.8 g/dL — ABNORMAL LOW (ref 12.0–15.0)
MCH: 30.6 pg (ref 26.0–34.0)
MCHC: 34.5 g/dL (ref 30.0–36.0)
MCV: 88.8 fL (ref 80.0–100.0)
Platelets: 215 10*3/uL (ref 150–400)
RBC: 3.85 MIL/uL — ABNORMAL LOW (ref 3.87–5.11)
RDW: 12.9 % (ref 11.5–15.5)
WBC: 11.4 10*3/uL — ABNORMAL HIGH (ref 4.0–10.5)
nRBC: 0 % (ref 0.0–0.2)

## 2022-01-13 LAB — BASIC METABOLIC PANEL
Anion gap: 8 (ref 5–15)
BUN: 11 mg/dL (ref 8–23)
CO2: 25 mmol/L (ref 22–32)
Calcium: 8.7 mg/dL — ABNORMAL LOW (ref 8.9–10.3)
Chloride: 103 mmol/L (ref 98–111)
Creatinine, Ser: 0.78 mg/dL (ref 0.44–1.00)
GFR, Estimated: >60 mL/min (ref >60)
Glucose, Bld: 164 mg/dL — ABNORMAL HIGH (ref 70–99)
Potassium: 4.4 mmol/L (ref 3.5–5.1)
Sodium: 136 mmol/L (ref 135–145)

## 2022-01-13 NOTE — Progress Notes (Signed)
Complained of numbness and soreness on the left shoulder and chest tube site. K-pad applied to the site cont. Continue to monitor.

## 2022-01-13 NOTE — Progress Notes (Signed)
Mobility Specialist Progress Note    01/13/22 1636  Mobility  Activity Ambulated with assistance in hallway  Level of Assistance Contact guard assist, steadying assist  Assistive Device Front wheel walker  Distance Ambulated (ft) 170 ft  Activity Response Tolerated well  Mobility Referral Yes  $Mobility charge 1 Mobility   Pre-Mobility: 65 HR, 92% SpO2 on RA During Mobility: 93 HR, 88% SpO2 on RA Post-Mobility: 64 HR, 91% SpO2 on RA  Pt received in chair and agreeable. No complaints on walk. Encouraged pursed lip breathing. Returned to chair with call bell in reach.    Hildred Alamin Mobility Specialist  Secure Chat Only

## 2022-01-13 NOTE — Progress Notes (Addendum)
Mobility Specialist Progress Note    01/13/22 1132  Mobility  Activity Ambulated with assistance to bathroom;Ambulated with assistance in hallway  Level of Assistance Contact guard assist, steadying assist  Assistive Device Front wheel walker  Distance Ambulated (ft) 170 ft (10+160)  Activity Response Tolerated well  Mobility Referral Yes  $Mobility charge 1 Mobility   During Mobility: 99 HR, 89-90% SpO2 on RA Post-Mobility: 63 HR, 93% SpO2 on RA  Pt received in bed requesting to use BR. Had void. Tolerated activity on RA. Returned to chair with call bell in reach and daughter present. RN advised leave on RA.  Hildred Alamin Mobility Specialist  Secure Chat Only

## 2022-01-13 NOTE — Plan of Care (Signed)

## 2022-01-13 NOTE — Op Note (Signed)
NAME: Priscilla, Taylor MEDICAL RECORD NO: [id number] ACCOUNT NO: [id number] DATE OF BIRTH: 1945-07-02 FACILITY: [facility] LOCATION: [facility] PHYSICIAN: Revonda Standard. Roxan Hockey, MD  Operative Report   DATE OF PROCEDURE: 01/12/2022  PREOPERATIVE DIAGNOSIS:  Left upper lobe lung nodule.  POSTOPERATIVE DIAGNOSIS:  Carcinoid tumor, left upper lobe, clinical stage IA (T1, N0).  PROCEDURE:   Xi robotic-assisted left upper lobectomy,  Lymph node dissection and  Intercostal nerve blocks levels 3 through 10.  SURGEON:  Revonda Standard. Roxan Hockey, MD  ASSISTANT:  Ellwood Handler, PA  ANESTHESIA:  General.  FINDINGS:  Nodule in midportion of left upper lobe.  Frozen section revealed carcinoid tumor.  Bronchial margin free of tumor.  CLINICAL NOTE:  Priscilla Taylor is a 76 year old nonsmoker who was found to have a lung nodule on a low dose CT for coronary calcium screening.  PET/CT showed the nodule was markedly hypermetabolic with an SUV of 6.1 with no evidence of regional or distant  metastatic disease.  We discussed the options and she wished to proceed with surgical resection for definitive diagnosis and treatment.  The indications, risks, benefits, and alternatives were discussed in detail with the patient.  She understood and  accepted the risks and agreed to proceed.  OPERATIVE NOTE:  Priscilla Taylor was brought to the preoperative holding area on 01/12/2022.  Anesthesia placed a central line and an arterial blood pressure monitoring line.  She was taken to the operating room and anesthetized and intubated with a double  lumen endotracheal tube.  Intravenous antibiotics were administered.  A Foley catheter was placed.  Sequential compression devices were placed on the calves for DVT prophylaxis.  She was placed in a right lateral decubitus position.  A Bair Hugger was  placed for active warming.  The left chest was prepped and draped in the usual sterile fashion.  Single lung ventilation of the right lung  was initiated and was tolerated well throughout the procedure.  A timeout was performed.  A solution containing 20 mL of liposomal bupivacaine, 30 mL of 0.5% bupivacaine and 50 mL of saline was prepared.  This solution was used for local at the incision sites as well as for the intercostal nerve blocks.  An incision was made in the eighth interspace in approximately the midaxillary line.  An 8 mm port was inserted.  The thoracoscope was advanced into the chest.  There was good isolation of the left lung.  There was no pleural effusion. Visceral and  parietal pleura appeared normal.  A 12 mm port was placed in the eighth interspace anterior to the camera port.  Carbon dioxide was insufflated per protocol.  Intercostal nerve blocks then were performed from the third to the tenth interspace injecting  10 mL of the bupivacaine solution into a subpleural plane at each level. A 12 mm AirSeal port was placed in the tenth interspace posterolaterally.  Two additional eighth interspace robotic ports were placed.  The robot was deployed.  The camera arm was  docked, targeting was performed.  The remaining arms were docked.  Robotic instruments were inserted with thoracoscopic visualization.  There was good isolation of the left lung, but it was relatively slow to deflate at first.  Suction was applied via the endotracheal tube.  While waiting for the lung to deflate adequately, the lymph node dissection was initiated.  The lower lobe was  retracted superiorly and the inferior ligament was divided with bipolar cautery.  There was a level 9 node, which was removed.  All  the lymph nodes were sent as separate specimens for permanent pathology.  Working along the hilum posteriorly, the pleural  reflection was divided and level 8 and level 7 nodes were removed. There also was a level 10 node along the bronchus in this area.  The pleura was dissected off the pulmonary artery.  Working more superiorly, a level 4L node was  removed.  The lung then  was retracted downward from the apex and the aortopulmonary window area was identified.  The pleura was divided over the AP window.  There was one very small node which appeared normal, which was removed.  There also was a much larger, but otherwise benign-appearing  node, which was removed as well.  There was some minor bleeding and this area was packed with Surgicel.   Cautery was kept to a minimum and the bleeding subsequently resolved.  The lung was retracted posteriorly and  the pleural reflection was divided at the hilum anteriorly, the phrenic nerve was identified and no cautery was used in its vicinity.  The fissure was inspected and was nearly complete anteriorly and in the mid portion. By this point, the lung had  deflated. The area most suspicious for the nodule was apparent, but it was not 100% clear that the nodule could be removed with a wedge resection from this area.  Decision was made to proceed with segmentectomy versus lobectomy depending on findings.   The pleural reflection was divided in the hilum.  A small portion of the fissure was completed with a stapler anteriorly, and a level 11 node was removed.  There actually were 2 nodes in that area, working along the pulmonary artery posteriorly, the  posterior portion of the fissure was completed with sequential firings of the robotic stapler.  The lingular segmental artery branches then were identified.  After removing a node adjacent to them, they were easily encircled and divided with the robotic stapler.  The lingular vein branches were encircled and divided with the robotic stapler and finally the lingular bronchus was encircled and stapler was placed across it and then closed. Test inflation revealed aeration of the nonlingular portion of  the upper lobe. Lingular segmental bronchus was divided with the robotic stapler.  The area of the nodule then was inspected.  A test inflation was performed and the nodule  was on the borderline of aerated lung, it was felt that segmentectomy would not  provide an adequate margin.  Decision was made to proceed with completing the lobectomy.  There was a very small posterior ascending branch, which was divided with the stapler and then there were 2 other posterior branches that were dissected out,  encircled, and divided.  This left 2 large anterior apical branches.  The remainder of the pulmonary vein was divided and then the two anterior branches were divided.  There was a large node, which was removed between the bronchus and these arterial  branches before dividing them.  The stapler then was placed across the origin of the left upper lobe bronchus and closed.  A test inflation showed good aeration of the lower lobe.  The stapler was fired, transecting the bronchus.  The vessel loop and  sponges that had been placed during the dissection were removed.  The chest was filled with saline.  A test inflation to 30 cm of water pressure revealed no air leakage from the bronchial stump or the staple lines.  The AirSeal port was removed.  A 15 mm  endoscopic retrieval bag  was inserted through the AirSeal incision.  The upper lobe was manipulated into the bag, which was then tightened and brought to the bottom of the chest.  The robotic instruments were removed.  The robot was undocked.  The  anterior eighth interspace incision was lengthened to 3 cm.  The left upper lobe was removed through the anterior incision and sent for frozen section of the bronchial margin, which was negative for tumor, and the nodule, which showed a carcinoid tumor.   All staple lines were inspected for hemostasis.  A 28-French Blake drain was placed through the original port incision, directed to the apex, and secured with #1 silk suture.  Dual lung ventilation was resumed.  The remaining incisions were closed in  standard fashion.  The chest tube was placed to a Pleur-Evac on waterseal.  The patient was  placed in a supine position.  She was then extubated in the operating room and taken to the postanesthetic care unit in good condition.  All sponge, needle and  instrument counts were correct at the end of the procedure.  Experienced assistance was necessary for this case due to surgical complexity.  Erin Barrett served as the Environmental consultant providing assistance with port placement, instrument exchange, specimen retrieval, suctioning, and wound closure.   SHW D: 01/12/2022 5:16:40 pm T: 01/13/2022 3:19:00 am  JOB: [id number]/ [id number]

## 2022-01-13 NOTE — Progress Notes (Addendum)
      [address]       [place] [address]             [phone]       1 Day Post-Op Procedure(s) (LRB): XI ROBOTIC ASSISTED THORACOSCOPY-LEFT UPPER LOBE LOBECTOMY (Left) INTERCOSTAL NERVE BLOCK (Left) LYMPH NODE DISSECTION (Left)  Subjective: Patient with pain in left shoulder  Objective: Vital signs in last 24 hours: Temp:  [97 F (36.1 C)-98.3 F (36.8 C)] 97.9 F (36.6 C) (10/19 0303) Pulse Rate:  [56-80] 74 (10/19 0303) Cardiac Rhythm: Bundle branch block;Heart block (10/18 1911) Resp:  [13-19] 19 (10/19 0303) BP: (90-140)/(48-73) 113/48 (10/19 0303) SpO2:  [90 %-96 %] 94 % (10/19 0303) Arterial Line BP: (135-174)/(55-70) 168/58 (10/18 1400)    Intake/Output from previous day: 10/18 0701 - 10/19 0700 In: 1750 [P.O.:200; I.V.:1550] Out: 676 [Urine:400; Blood:50; Chest Tube:226]   Physical Exam:  Cardiovascular: RRR Pulmonary: Clear to auscultation on right and slightly diminished left Abdomen: Soft, non tender, bowel sounds present. Extremities: SCDs in place Wounds: Clean and dry.  No erythema or signs of infection. Chest Tube: To water seal, tidling but no air leak  Lab Results: CBC: Recent Labs    01/10/22 1137 01/12/22 0922 01/13/22 0024  WBC 5.7  --  11.4*  HGB 13.5 12.2 11.8*  HCT 41.0 36.0 34.2*  PLT 273  --  215   BMET:  Recent Labs    01/10/22 1137 01/12/22 0922 01/13/22 0024  NA 139 137 136  K 4.1 4.1 4.4  CL 107  --  103  CO2 23  --  25  GLUCOSE 107*  --  164*  BUN 13  --  11  CREATININE 0.60  --  0.78  CALCIUM 9.6  --  8.7*    PT/INR:  Recent Labs    01/10/22 1137  LABPROT 12.8  INR 1.0   ABG:  INR: Will add last result for INR, ABG once components are confirmed Will add last 4 CBG results once components are confirmed  Assessment/Plan:  1. CV - SR, first degree heart block. 2.  Pulmonary - On 3 liters of oxygen via [place];wean as able. Chest tube with 226 cc since surgery. Chest tube is to water  seal, tidling but no air leak. CXR this am appear stable (left pneumothorax, atelectasis at bases). Encourage incentive spirometer. Await final pathology. 3. Mild expected post op blood loss anemia-H and H this am 11.8 and 34.2 4. On Lovenox for DVT prophylaxis 5. Remove foley, stop IVF 6. At patient request, heating pad for left shoulder. This pain is likely from chest tube/surgery  Donielle M ZimmermanPA-C 01/13/2022,6:56 AM  Patient seen and examined, agree with above No air leak, < 300 ml drainage- will dc chest tube Ambulate IS  Remo Lipps C. Roxan Hockey, MD [facility] [phone]

## 2022-01-14 ENCOUNTER — Inpatient Hospital Stay (HOSPITAL_COMMUNITY): Payer: No Typology Code available for payment source

## 2022-01-14 DIAGNOSIS — J439 Emphysema, unspecified: Secondary | ICD-10-CM | POA: Diagnosis not present

## 2022-01-14 LAB — COMPREHENSIVE METABOLIC PANEL
ALT: 31 U/L (ref 0–44)
AST: 48 U/L — ABNORMAL HIGH (ref 15–41)
Albumin: 3.1 g/dL — ABNORMAL LOW (ref 3.5–5.0)
Alkaline Phosphatase: 57 U/L (ref 38–126)
Anion gap: 7 (ref 5–15)
BUN: 19 mg/dL (ref 8–23)
CO2: 25 mmol/L (ref 22–32)
Calcium: 8.5 mg/dL — ABNORMAL LOW (ref 8.9–10.3)
Chloride: 105 mmol/L (ref 98–111)
Creatinine, Ser: 0.79 mg/dL (ref 0.44–1.00)
GFR, Estimated: >60 mL/min (ref >60)
Glucose, Bld: 115 mg/dL — ABNORMAL HIGH (ref 70–99)
Potassium: 4.4 mmol/L (ref 3.5–5.1)
Sodium: 137 mmol/L (ref 135–145)
Total Bilirubin: 0.4 mg/dL (ref 0.3–1.2)
Total Protein: 5.5 g/dL — ABNORMAL LOW (ref 6.5–8.1)

## 2022-01-14 LAB — CBC
HCT: 32.4 % — ABNORMAL LOW (ref 36.0–46.0)
Hemoglobin: 10.6 g/dL — ABNORMAL LOW (ref 12.0–15.0)
MCH: 29.8 pg (ref 26.0–34.0)
MCHC: 32.7 g/dL (ref 30.0–36.0)
MCV: 91 fL (ref 80.0–100.0)
Platelets: 211 10*3/uL (ref 150–400)
RBC: 3.56 MIL/uL — ABNORMAL LOW (ref 3.87–5.11)
RDW: 13.2 % (ref 11.5–15.5)
WBC: 10.7 10*3/uL — ABNORMAL HIGH (ref 4.0–10.5)
nRBC: 0 % (ref 0.0–0.2)

## 2022-01-14 MED ORDER — OXYCODONE HCL 5 MG PO TABS: 5.0000 mg | ORAL_TABLET | Freq: Four times a day (QID) | ORAL | 0 refills | Status: DC | PRN

## 2022-01-14 NOTE — Progress Notes (Addendum)
      [address]       [place] [address]             [phone]       2 Days Post-Op Procedure(s) (LRB): XI ROBOTIC ASSISTED THORACOSCOPY-LEFT UPPER LOBE LOBECTOMY (Left) INTERCOSTAL NERVE BLOCK (Left) LYMPH NODE DISSECTION (Left)  Subjective: Patient has left shoulder discomfort/pain. She feels "like she has wheezing". She would like to walk around this am   Objective: Vital signs in last 24 hours: Temp:  [97.8 F (36.6 C)-98.4 F (36.9 C)] 98.1 F (36.7 C) (10/20 0421) Pulse Rate:  [64-75] 67 (10/20 0421) Cardiac Rhythm: Sinus bradycardia;Heart block (10/20 0400) Resp:  [16-21] 21 (10/20 0421) BP: (90-127)/(46-86) 126/52 (10/20 0421) SpO2:  [94 %-96 %] 95 % (10/20 0421)    Intake/Output from previous day: 10/19 0701 - 10/20 0700 In: 450 [P.O.:450] Out: 1900 [Urine:1900]   Physical Exam:  Cardiovascular: RRR Pulmonary: Mostly clear Abdomen: Soft, non tender, bowel sounds present. Extremities: No LE edema Wounds: Clean and dry.  No erythema or signs of infection.   Lab Results: CBC: Recent Labs    01/13/22 0024 01/14/22 0027  WBC 11.4* 10.7*  HGB 11.8* 10.6*  HCT 34.2* 32.4*  PLT 215 211    BMET:  Recent Labs    01/13/22 0024 01/14/22 0027  NA 136 137  K 4.4 4.4  CL 103 105  CO2 25 25  GLUCOSE 164* 115*  BUN 11 19  CREATININE 0.78 0.79  CALCIUM 8.7* 8.5*     PT/INR:  No results for input(s): "LABPROT", "INR" in the last 72 hours.  ABG:  INR: Will add last result for INR, ABG once components are confirmed Will add last 4 CBG results once components are confirmed  Assessment/Plan:  1. CV - SR, first degree heart block. 2.  Pulmonary - On room air. Chest tube removed yesterday. CXR this am appear to be relatively stable (left pneumothorax, atelectasis at bases). Encourage incentive spirometer. Await final pathology. 3. Mild expected post op blood loss anemia-H and H this am slightly decreased to 10.6 and 32.4 4. On  Lovenox for DVT prophylaxis 5. Left shoulder discomfort/pain related to surgery;should improve with time 6. Await official interpretation of CXR;hope to discharge later today  Donielle M ZimmermanPA-C 01/14/2022,7:21 AM  Patient seen and examined, agree with above No wheezing on exam Pain still present but improved Possibly home later today  Remo Lipps C. Roxan Hockey, MD [facility] [phone]

## 2022-01-14 NOTE — Progress Notes (Signed)
Mobility Specialist Progress Note    01/14/22 1301  Mobility  Activity Ambulated with assistance in hallway  Level of Assistance Standby assist, set-up cues, supervision of patient - no hands on  Assistive Device Front wheel walker  Distance Ambulated (ft) 170 ft  Activity Response Tolerated well  $Mobility charge 1 Mobility   Pt received in chair and agreeable. No complaints on walk. Returned to chair with call bell in reach.    Hildred Alamin Mobility Specialist  Secure Chat Only

## 2022-01-14 NOTE — Care Management Important Message (Signed)
Important Message  Patient Details  Name: Priscilla Taylor MRN: [id number] Date of Birth: 08-11-1945   Medicare Important Message Given:  Yes     Orbie Pyo 01/14/2022, 2:46 PM

## 2022-01-14 NOTE — TOC Initial Note (Signed)
Transition of Care [facility]) - Initial/Assessment Note    Patient Details  Name: Priscilla Taylor MRN: [id number] Date of Birth: 1945-11-01  Transition of [facility]) CM/SW Contact:    Marilu Favre, RN Phone Number: 01/14/2022, 11:00 AM  Clinical Narrative:                 Spoke to patient at bedside. Confirmed face sheet information. From home with brother , daughter.   Patient requesting bedside commode. Ordered same. Patient's insurance is not in network with [facility].   Called Jermain with [employer]. [employer] will deliver bedside commode to her home.   Patient aware.   Expected Discharge Plan: Home/Self Care Barriers to Discharge: Continued Medical Work up   Patient Goals and CMS Choice Patient states their goals for this hospitalization and ongoing recovery are:: to return to home CMS Medicare.gov Compare Post Acute Care list provided to:: Patient Choice offered to / list presented to : Patient  Expected Discharge Plan and Services Expected Discharge Plan: Home/Self Care   Discharge Planning Services: CM Consult Post Acute Care Choice: Durable Medical Equipment Living arrangements for the past 2 months: Single Family Home                 DME Arranged: 3-N-1 DME Agency: Other - Comment ([employer]) Date DME Agency Contacted: 01/14/22 Time DME Agency Contacted: 58 Representative spoke with at DME Agency: Brenton [facility] HH Arranged: NA          Prior Living Arrangements/Services Living arrangements for the past 2 months: [place] Lives with:: Siblings, Adult Children Patient language and need for interpreter reviewed:: Yes Do you feel safe going back to the place where you live?: Yes      Need for Family Participation in Patient Care: Yes (Comment) Care giver support system in place?: Yes (comment)   Criminal Activity/Legal Involvement Pertinent to Current Situation/Hospitalization: No - Comment as needed  Activities of Daily Living      Permission  Sought/Granted   Permission granted to share information with : No              Emotional Assessment Appearance:: Appears stated age Attitude/Demeanor/Rapport: Engaged Affect (typically observed): Accepting Orientation: : Oriented to Self, Oriented to Place, Oriented to  Time, Oriented to Situation Alcohol / Substance Use: Not Applicable Psych Involvement: No (comment)  Admission diagnosis:  S/P lobectomy of lung [Z90.2] Patient Active Problem List   Diagnosis Date Noted   S/P Robotic Assisted Video Thoracoscopy wtih Left Upper Lobectomy 01/12/2022   Abnormal CT scan 11/04/2021   Aortic atherosclerosis ([place]) 11/04/2021   Back pain 11/04/2021   Eczema 11/04/2021   Family history of high cholesterol 11/04/2021   Generalized anxiety disorder 11/04/2021   High risk medication use 11/04/2021   OA (osteoarthritis) of finger 11/04/2021   Mixed hyperlipidemia 11/04/2021   Lung nodule seen on imaging study 11/04/2021   Urinary urgency 11/04/2021   Arthritis 11/04/2021   Spinal stenosis 11/04/2021   Prediabetes 11/04/2021   Other specified disorders of bone density and structure, other site 11/04/2021   Obesity, Class I, BMI 30-34.9 11/04/2021   Glaucoma (increased eye pressure) 07/27/2021   Neck pain 04/12/2021   DDD (degenerative disc disease), cervical 02/23/2021   Presence of artificial knee joint 07/26/2012   PCP:  Lawerance Cruel, MD Pharmacy:   [id number] [address], [place] [address] Phone: [phone] Fax: [phone]     Social Determinants of  Health (SDOH) Interventions    Readmission Risk Interventions     No data to display

## 2022-01-14 NOTE — Progress Notes (Signed)
Discharge instructions  reviewed with patient no questions or concerns at this time. IV's removed x2. Paperwork placed with belongings.

## 2022-01-14 NOTE — Discharge Instructions (Signed)
Robot-Assisted Thoracic Surgery, Care After The following information offers guidance on how to care for yourself after your procedure. Your health care provider may also give you more specific instructions. If you have problems or questions, contact your health care provider. What can I expect after the procedure? After the procedure, it is common to have: Some pain and aches in the area of your surgical incisions. Pain when breathing in (inhaling) and coughing. Tiredness (fatigue). Trouble sleeping. Constipation. Follow these instructions at home: Medicines Take over-the-counter and prescription medicines only as told by your health care provider. If you were prescribed an antibiotic medicine, take it as told by your health care provider. Do not stop taking the antibiotic even if you start to feel better. Talk with your health care provider about safe and effective ways to manage pain after your procedure. Pain management should fit your specific health needs. Take pain medicine before pain becomes severe. Relieving and controlling your pain will make breathing easier for you. Ask your health care provider if the medicine prescribed to you requires you to avoid driving or using machinery. Eating and drinking Follow instructions from your health care provider about eating or drinking restrictions. These will vary depending on what procedure you had. Your health care provider may recommend: A liquid diet or soft diet for the first few days. Meals that are smaller and more frequent. A diet of fruits, vegetables, whole grains, and low-fat proteins. Limiting foods that are high in fat and processed sugar, including fried or sweet foods. Incision care Follow instructions from your health care provider about how to take care of your incisions. Make sure you: Wash your hands with soap and water for at least 20 seconds before and after you change your bandage (dressing). If soap and water are not  available, use hand sanitizer. Change your dressing as told by your health care provider. Leave stitches (sutures), skin glue, or adhesive strips in place. These skin closures may need to stay in place for 2 weeks or longer. If adhesive strip edges start to loosen and curl up, you may trim the loose edges. Do not remove adhesive strips completely unless your health care provider tells you to do that. Check your incision area every day for signs of infection. Check for: Redness, swelling, or more pain. Fluid or blood. Warmth. Pus or a bad smell. Activity Return to your normal activities as told by your health care provider. Ask your health care provider what activities are safe for you. Ask your health care provider when it is safe for you to drive. Do not lift anything that is heavier than 10 lb (4.5 kg), or the limit that you are told, until your health care provider says that it is safe. Rest as told by your health care provider. Avoid sitting for a long time without moving. Get up to take short walks every 1-2 hours. This is important to improve blood flow and breathing. Ask for help if you feel weak or unsteady. Do exercises as told by your health care provider. Pneumonia prevention  Do deep breathing exercises and cough regularly as directed. This helps clear mucus and opens your lungs. Doing this helps prevent lung infection (pneumonia). If you were given an incentive spirometer, use it as told. An incentive spirometer is a tool that measures how well you are filling your lungs with each breath. Coughing may hurt less if you try to support your chest. This is called splinting. Try one of these when you  cough: Hold a pillow against your chest. Place the palms of both hands on top of your incision area. Do not use any products that contain nicotine or tobacco. These products include cigarettes, chewing tobacco, and vaping devices, such as e-cigarettes. If you need help quitting, ask your  health care provider. Avoid secondhand smoke. General instructions If you have a drainage tube: Follow instructions from your health care provider about how to take care of it. Do not travel by airplane after your tube is removed until your health care provider tells you it is safe. You may need to take these actions to prevent or treat constipation: Drink enough fluid to keep your urine pale yellow. Take over-the-counter or prescription medicines. Eat foods that are high in fiber, such as beans, whole grains, and fresh fruits and vegetables. Limit foods that are high in fat and processed sugars, such as fried or sweet foods. Keep all follow-up visits. This is important. Contact a health care provider if: You have redness, swelling, or more pain around an incision. You have fluid or blood coming from an incision. An incision feels warm to the touch. You have pus or a bad smell coming from an incision. You have a fever. You cannot eat or drink without vomiting. Your pain medicine is not controlling your pain. Get help right away if: You have chest pain. Your heart is beating quickly. You have trouble breathing. You have trouble speaking. You are confused. You feel weak or dizzy, or you faint. These symptoms may represent a serious problem that is an emergency. Do not wait to see if the symptoms will go away. Get medical help right away. Call your local emergency services (911 in the [place] Do not drive yourself to the hospital. Summary Talk with your health care provider about safe and effective ways to manage pain after your procedure. Pain management should fit your specific health needs. Return to your normal activities as told by your health care provider. Ask your health care provider what activities are safe for you. Do deep breathing exercises and cough regularly as directed. This helps to clear mucus and prevent pneumonia. If it hurts to cough, ease pain by holding a pillow  against your chest or by placing the palms of both hands over your incisions. This information is not intended to replace advice given to you by your health care provider. Make sure you discuss any questions you have with your health care provider. Document Revised: 12/06/2019 Document Reviewed: 12/06/2019 Elsevier Patient Education  [place].

## 2022-01-17 LAB — SURGICAL PATHOLOGY

## 2022-01-24 ENCOUNTER — Other Ambulatory Visit: Payer: Self-pay | Admitting: Thoracic Surgery (Cardiothoracic Vascular Surgery)

## 2022-01-24 DIAGNOSIS — Z902 Acquired absence of lung [part of]: Secondary | ICD-10-CM

## 2022-01-25 ENCOUNTER — Ambulatory Visit
Admission: RE | Admit: 2022-01-25 | Discharge: 2022-01-25 | Disposition: A | Discharge status: Home / Self Care | Payer: No Typology Code available for payment source | Source: Ambulatory Visit | Attending: Thoracic Surgery (Cardiothoracic Vascular Surgery) | Admitting: Thoracic Surgery (Cardiothoracic Vascular Surgery)

## 2022-01-25 ENCOUNTER — Ambulatory Visit (INDEPENDENT_AMBULATORY_CARE_PROVIDER_SITE_OTHER): Payer: Self-pay | Admitting: Thoracic Surgery (Cardiothoracic Vascular Surgery)

## 2022-01-25 ENCOUNTER — Other Ambulatory Visit: Payer: Self-pay | Admitting: Thoracic Surgery (Cardiothoracic Vascular Surgery)

## 2022-01-25 VITALS — BP 165/76 | HR 67 | Resp 20 | Ht 62.0 in | Wt 186.0 lb

## 2022-01-25 DIAGNOSIS — Z902 Acquired absence of lung [part of]: Secondary | ICD-10-CM | POA: Diagnosis not present

## 2022-01-25 DIAGNOSIS — J939 Pneumothorax, unspecified: Secondary | ICD-10-CM | POA: Diagnosis not present

## 2022-01-25 DIAGNOSIS — Z9889 Other specified postprocedural states: Secondary | ICD-10-CM | POA: Diagnosis not present

## 2022-01-25 DIAGNOSIS — J948 Other specified pleural conditions: Secondary | ICD-10-CM | POA: Diagnosis not present

## 2022-01-25 DIAGNOSIS — M2578 Osteophyte, vertebrae: Secondary | ICD-10-CM | POA: Diagnosis not present

## 2022-01-25 MED ORDER — CIPROFLOXACIN HCL 500 MG PO TABS: 500.0000 mg | ORAL_TABLET | Freq: Two times a day (BID) | ORAL | 0 refills | Status: DC

## 2022-01-25 NOTE — Progress Notes (Signed)
[address]       [place] [address]             [phone]     HPI: [name] returns for scheduled follow-up visit after recent lobectomy for carcinoid tumor  Priscilla Taylor is a 76 year old non-smoker who was found to have a lung nodule on a CT for coronary calcium score.  On PET/CT the nodule was markedly hypermetabolic.  I did a robotic assisted left upper lobectomy on 01/12/2022.  She did well postoperatively and went home on day 2.  She complains pain along the left costal margin.  Currently is taking Tylenol twice a day.  She is taking one half of an oxycodone tablet in the morning and the afternoon and 1 tablet before she goes to bed at night.  She also complains of urinary incontinence with both frequency and urgency.  Past Medical History:  Diagnosis Date   Arthritis    Coronary atherosclerosis due to calcified coronary lesion    Eczema    Family history of adverse reaction to anesthesia    post op N&V   Fatty liver    Frequency of urination    at bedtime   GERD (gastroesophageal reflux disease)    History of kidney stones    Hyperlipidemia    Pneumonia    hx of years ago   Reflux     Current Outpatient Medications  Medication Sig Dispense Refill   ALPRAZolam (XANAX) 0.25 MG tablet Take 0.125 mg by mouth at bedtime.     aspirin EC 81 MG tablet Take 1 tablet (81 mg total) by mouth daily. Swallow whole. 30 tablet 12   Cholecalciferol (VITAMIN D3) 25 MCG (1000 UT) CAPS Take 1,000 Units by mouth daily.     ciprofloxacin (CIPRO) 500 MG tablet Take 1 tablet (500 mg total) by mouth 2 (two) times daily. 10 tablet 0   citalopram (CELEXA) 20 MG tablet Take 20 mg by mouth daily.     dorzolamide (TRUSOPT) 2 % ophthalmic solution Place 1 drop into both eyes daily.     latanoprost (XALATAN) 0.005 % ophthalmic solution Place 1 drop into both eyes at bedtime.     Magnesium 250 MG TABS Take 250 mg by mouth daily.     meloxicam (MOBIC) 15 MG tablet  Take 15 mg by mouth daily.     Multiple Vitamin (MULTIVITAMIN) tablet Take 1 tablet by mouth daily.     Multiple Vitamins-Minerals (PRESERVISION AREDS 2) CAPS Take 1 capsule by mouth daily.     niacin 500 MG tablet Take 1,000 mg by mouth daily.     oxyCODONE (OXY IR/ROXICODONE) 5 MG immediate release tablet Take 1 tablet (5 mg total) by mouth every 6 (six) hours as needed for moderate pain. 30 tablet 0   Probiotic Product (PROBIOTIC-10 PO) Take 1 capsule by mouth daily.     rosuvastatin (CRESTOR) 20 MG tablet Take 20 mg by mouth daily.     timolol (TIMOPTIC) 0.5 % ophthalmic solution Place 1 drop into both eyes daily.     Turmeric 500 MG CAPS Take 500 mg by mouth daily.     No current facility-administered medications for this visit.    Physical Exam BP (!) 165/76   Pulse 67   Resp 20   Ht 5\' 2"  (1.575 m)   Wt 186 lb (84.4 kg)   SpO2 92%   BMI 34.02 kg/m  Well-appearing 76 year old woman in no acute distress  Alert and oriented x3 with no focal deficits Lungs slightly diminished on left but overall clear with good breath sounds bilaterally Cardiac regular rate and rhythm Incisions clean dry and intact  Diagnostic Tests: CHEST - 2 VIEW   COMPARISON:  01/14/2022   FINDINGS: Normal heart size, mediastinal contours, and pulmonary vascularity.   Atherosclerotic calcification aorta.   Loculated hydropneumothorax anteriorly in LEFT hemithorax, air-fluid level in the upper lobe region.   Lungs otherwise clear.   No mediastinal shift or definite infiltrate.   Bones demineralized with scattered endplate spur formation thoracic spine.   IMPRESSION: Loculated moderate LEFT hydropneumothorax.   Critical Value/emergent results were called by telephone at the time of interpretation on 01/25/2022 at 0900 to provider [place] with DR. Remo Lipps [facility] office , who verbally acknowledged these results.     Electronically Signed   By: Priscilla Taylor M.D.   On: 01/25/2022  09:00   I personally reviewed the chest x-ray images.  Findings are consistent with a postoperative space that is slowly filling with fluid.  Impression: Priscilla Taylor is a 76 year old non-smoker who was incidentally found to have a lung nodule.  That nodule was hypermetabolic on PET.  I did a robotic assisted left upper lobectomy.  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  She did well in the early postoperative period and went home on day 2.  Postoperative pain-as expected.  Over the last couple days has been very well controlled with her regimen of Tylenol and oxycodone.  Should continue to improve with time.  She knows to call if she needs additional pain medication.  If she were to develop more paresthesias, she should call us and we could start either gabapentin or Lyrica.  Chest x-ray findings are consistent with a post lobectomy space that is gradually filling with fluid.  No fevers, chills or other symptoms to suggest infection.  She may begin driving on a very limited basis.  Appropriate precautions were discussed.  She knows not to drive if she is experiencing pain.  Urinary incontinence-likely a catheter related UTI.  We will start empiric Cipro.  We will check a UA today.  Plan: Cipro 500 mg p.o. twice daily for 5 days Return in 3 weeks with PA lateral chest x-ray to check on progress Call if any concerns in the meantime   [name], MD [facility] [phone]

## 2022-01-26 LAB — URINALYSIS
Bilirubin Urine: NEGATIVE
Glucose, UA: NEGATIVE
Hgb urine dipstick: NEGATIVE
Ketones, ur: NEGATIVE
Nitrite: NEGATIVE
Protein, ur: NEGATIVE
Specific Gravity, Urine: 1.018 (ref 1.001–1.035)
pH: 6 (ref 5.0–8.0)

## 2022-01-28 ENCOUNTER — Other Ambulatory Visit: Payer: Self-pay | Admitting: *Deleted

## 2022-01-28 NOTE — Progress Notes (Signed)
The proposed treatment discussed in conference is for discussion purpose only and is not a binding recommendation.  The patients have not been physically examined, or presented with their treatment options.  Therefore, final treatment plans cannot be decided.  

## 2022-01-31 ENCOUNTER — Other Ambulatory Visit: Payer: Self-pay | Admitting: Physician Assistant

## 2022-01-31 ENCOUNTER — Telehealth: Payer: Self-pay | Admitting: *Deleted

## 2022-01-31 MED ORDER — OXYCODONE HCL 5 MG PO TABS: 5.0000 mg | ORAL_TABLET | Freq: Three times a day (TID) | ORAL | 0 refills | Status: DC | PRN

## 2022-01-31 NOTE — Telephone Encounter (Signed)
Patient contacted the office requesting a refill of Oxycodone. Patient is s/p lobectomy 10/18 by Dr. Roxan Hockey. Patient reports taking 1/2 of a tablet in the morning and the other 1/2 in the afternoon and a full tablet at night. Patient states she is taking Tylenol. Describes pain as a soreness on the left side of her body. Denies radiating or stabbing pain. States she had numbness and tingling to left thoracic area but that has resolved. Refill sent to patient's preferred pharmacy by E. Barrett, PA. Patient made aware of prescription changes. Patient verbalized understanding.

## 2022-01-31 NOTE — Progress Notes (Signed)
Patient called requesting a refill on Oxycodone.  The patient is using very infrequently.  She has not required a refill since her discharge date of 10/20.    Will provide a refill for Oxycodone 1 tablet every 8 hours as needed for pain.  She will be given 10 tablets w/o refills.    She will follow up with Dr. Roxan Hockey as scheduled later this month.  Ellwood Handler, PA-C

## 2022-02-01 ENCOUNTER — Inpatient Hospital Stay: Payer: No Typology Code available for payment source | Attending: Internal Medicine

## 2022-02-01 ENCOUNTER — Inpatient Hospital Stay (HOSPITAL_BASED_OUTPATIENT_CLINIC_OR_DEPARTMENT_OTHER): Payer: No Typology Code available for payment source | Admitting: Internal Medicine

## 2022-02-01 ENCOUNTER — Other Ambulatory Visit: Payer: Self-pay | Admitting: Medical Oncology

## 2022-02-01 VITALS — BP 178/64 | HR 61 | Temp 97.7°F | Resp 15 | Wt 185.4 lb

## 2022-02-01 DIAGNOSIS — I2584 Coronary atherosclerosis due to calcified coronary lesion: Secondary | ICD-10-CM | POA: Diagnosis not present

## 2022-02-01 DIAGNOSIS — Z79899 Other long term (current) drug therapy: Secondary | ICD-10-CM | POA: Diagnosis not present

## 2022-02-01 DIAGNOSIS — Z7982 Long term (current) use of aspirin: Secondary | ICD-10-CM | POA: Insufficient documentation

## 2022-02-01 DIAGNOSIS — C3490 Malignant neoplasm of unspecified part of unspecified bronchus or lung: Secondary | ICD-10-CM

## 2022-02-01 DIAGNOSIS — Z87442 Personal history of urinary calculi: Secondary | ICD-10-CM | POA: Diagnosis not present

## 2022-02-01 DIAGNOSIS — I2510 Atherosclerotic heart disease of native coronary artery without angina pectoris: Secondary | ICD-10-CM | POA: Insufficient documentation

## 2022-02-01 DIAGNOSIS — Z791 Long term (current) use of non-steroidal anti-inflammatories (NSAID): Secondary | ICD-10-CM | POA: Insufficient documentation

## 2022-02-01 DIAGNOSIS — K219 Gastro-esophageal reflux disease without esophagitis: Secondary | ICD-10-CM | POA: Insufficient documentation

## 2022-02-01 DIAGNOSIS — E785 Hyperlipidemia, unspecified: Secondary | ICD-10-CM | POA: Diagnosis not present

## 2022-02-01 DIAGNOSIS — C7A090 Malignant carcinoid tumor of the bronchus and lung: Secondary | ICD-10-CM | POA: Diagnosis not present

## 2022-02-01 LAB — CBC WITH DIFFERENTIAL (CANCER CENTER ONLY)
Abs Immature Granulocytes: 0.02 10*3/uL (ref 0.00–0.07)
Basophils Absolute: 0.1 10*3/uL (ref 0.0–0.1)
Basophils Relative: 2 %
Eosinophils Absolute: 0.5 10*3/uL (ref 0.0–0.5)
Eosinophils Relative: 7 %
HCT: 36 % (ref 36.0–46.0)
Hemoglobin: 12 g/dL (ref 12.0–15.0)
Immature Granulocytes: 0 %
Lymphocytes Relative: 25 %
Lymphs Abs: 1.7 10*3/uL (ref 0.7–4.0)
MCH: 29.8 pg (ref 26.0–34.0)
MCHC: 33.3 g/dL (ref 30.0–36.0)
MCV: 89.3 fL (ref 80.0–100.0)
Monocytes Absolute: 0.8 10*3/uL (ref 0.1–1.0)
Monocytes Relative: 12 %
Neutro Abs: 3.7 10*3/uL (ref 1.7–7.7)
Neutrophils Relative %: 54 %
Platelet Count: 316 10*3/uL (ref 150–400)
RBC: 4.03 MIL/uL (ref 3.87–5.11)
RDW: 13.2 % (ref 11.5–15.5)
WBC Count: 6.7 10*3/uL (ref 4.0–10.5)
nRBC: 0 % (ref 0.0–0.2)

## 2022-02-01 LAB — CMP (CANCER CENTER ONLY)
ALT: 14 U/L (ref 0–44)
AST: 18 U/L (ref 15–41)
Albumin: 4.1 g/dL (ref 3.5–5.0)
Alkaline Phosphatase: 77 U/L (ref 38–126)
Anion gap: 6 (ref 5–15)
BUN: 16 mg/dL (ref 8–23)
CO2: 30 mmol/L (ref 22–32)
Calcium: 9.2 mg/dL (ref 8.9–10.3)
Chloride: 105 mmol/L (ref 98–111)
Creatinine: 0.64 mg/dL (ref 0.44–1.00)
GFR, Estimated: >60 mL/min (ref >60)
Glucose, Bld: 98 mg/dL (ref 70–99)
Potassium: 4.2 mmol/L (ref 3.5–5.1)
Sodium: 141 mmol/L (ref 135–145)
Total Bilirubin: 0.4 mg/dL (ref 0.3–1.2)
Total Protein: 7 g/dL (ref 6.5–8.1)

## 2022-02-01 NOTE — Progress Notes (Signed)
[facility] Telephone:([phone]   Fax:([phone]  OFFICE PROGRESS NOTE  Lawerance Cruel, MD [place] [address]  DIAGNOSIS: Stage IA (T1c, N0, M0) non-small cell lung cancer, well-differentiated neuroendocrine, typical carcinoid tumor diagnosed in October 2023  PRIOR THERAPY: Status post left upper lobectomy with lymph node dissection under the care of Dr. Roxan Hockey on January 12, 2022.  CURRENT THERAPY: Observation.  INTERVAL HISTORY: Priscilla Taylor 76 y.o. female returns to the clinic today for follow-up visit.  The patient is feeling fine today with no concerning complaints except for the pain on the left side of the chest after the surgical resection.  She denied having any cough, shortness of breath or hemoptysis.  She has no nausea, vomiting, diarrhea or constipation.  She has no headache or visual changes.  She underwent left upper lobectomy with lymph node dissection under the care of Dr. Roxan Hockey around 3 weeks ago.  She is here today for evaluation and recommendation regarding her condition.  MEDICAL HISTORY: Past Medical History:  Diagnosis Date   Arthritis    Coronary atherosclerosis due to calcified coronary lesion    Eczema    Family history of adverse reaction to anesthesia    post op N&V   Fatty liver    Frequency of urination    at bedtime   GERD (gastroesophageal reflux disease)    History of kidney stones    Hyperlipidemia    Pneumonia    hx of years ago   Reflux     ALLERGIES:  is allergic to sulfa antibiotics, amoxicillin, lipitor [atorvastatin], and zocor [simvastatin].  MEDICATIONS:  Current Outpatient Medications  Medication Sig Dispense Refill   ALPRAZolam (XANAX) 0.25 MG tablet Take 0.125 mg by mouth at bedtime.     aspirin EC 81 MG tablet Take 1 tablet (81 mg total) by mouth daily. Swallow whole. 30 tablet 12   Cholecalciferol (VITAMIN D3) 25 MCG (1000 UT) CAPS Take 1,000 Units by mouth  daily.     ciprofloxacin (CIPRO) 500 MG tablet Take 1 tablet (500 mg total) by mouth 2 (two) times daily. 10 tablet 0   citalopram (CELEXA) 20 MG tablet Take 20 mg by mouth daily.     dorzolamide (TRUSOPT) 2 % ophthalmic solution Place 1 drop into both eyes daily.     latanoprost (XALATAN) 0.005 % ophthalmic solution Place 1 drop into both eyes at bedtime.     Magnesium 250 MG TABS Take 250 mg by mouth daily.     meloxicam (MOBIC) 15 MG tablet Take 15 mg by mouth daily.     Multiple Vitamin (MULTIVITAMIN) tablet Take 1 tablet by mouth daily.     Multiple Vitamins-Minerals (PRESERVISION AREDS 2) CAPS Take 1 capsule by mouth daily.     niacin 500 MG tablet Take 1,000 mg by mouth daily.     oxyCODONE (OXY IR/ROXICODONE) 5 MG immediate release tablet Take 1 tablet (5 mg total) by mouth every 8 (eight) hours as needed for moderate pain. 10 tablet 0   Probiotic Product (PROBIOTIC-10 PO) Take 1 capsule by mouth daily.     rosuvastatin (CRESTOR) 20 MG tablet Take 20 mg by mouth daily.     timolol (TIMOPTIC) 0.5 % ophthalmic solution Place 1 drop into both eyes daily.     Turmeric 500 MG CAPS Take 500 mg by mouth daily.     No current facility-administered medications for this visit.    SURGICAL HISTORY:  Past Surgical History:  Procedure Laterality Date   APPENDECTOMY     BREAST BIOPSY Left 03/28/1984   CATARACT EXTRACTION Bilateral    CESAREAN SECTION     INTERCOSTAL NERVE BLOCK Left 01/12/2022   Procedure: INTERCOSTAL NERVE BLOCK;  Surgeon: [name], MD;  Location: [facility];  Service: Thoracic;  Laterality: Left;   JOINT REPLACEMENT Left 03/28/2002   knee   LYMPH NODE DISSECTION Left 01/12/2022   Procedure: LYMPH NODE DISSECTION;  Surgeon: [name], MD;  Location: [facility];  Service: Thoracic;  Laterality: Left;   TOTAL KNEE ARTHROPLASTY Right 06/11/2012   Procedure: TOTAL KNEE ARTHROPLASTY;  Surgeon: Vickey Huger, MD;  Location: [facility];  Service: Orthopedics;   Laterality: Right;   TOTAL KNEE ARTHROPLASTY Right 06/11/2012   Dr Ronnie Derby    REVIEW OF SYSTEMS:  Constitutional: positive for fatigue Eyes: negative Ears, nose, mouth, throat, and face: negative Respiratory: positive for pleurisy/chest pain Cardiovascular: negative Gastrointestinal: negative Genitourinary:negative Integument/breast: negative Hematologic/lymphatic: negative Musculoskeletal:negative Neurological: negative Behavioral/Psych: negative Endocrine: negative Allergic/Immunologic: negative   PHYSICAL EXAMINATION: General appearance: alert, cooperative, fatigued, and no distress Head: Normocephalic, without obvious abnormality, atraumatic Neck: no adenopathy, no JVD, supple, symmetrical, trachea midline, and thyroid not enlarged, symmetric, no tenderness/mass/nodules Lymph nodes: Cervical, supraclavicular, and axillary nodes normal. Resp: clear to auscultation bilaterally Back: symmetric, no curvature. ROM normal. No CVA tenderness. Cardio: regular rate and rhythm, S1, S2 normal, no murmur, click, rub or gallop GI: soft, non-tender; bowel sounds normal; no masses,  no organomegaly Extremities: extremities normal, atraumatic, no cyanosis or edema Neurologic: Alert and oriented X 3, normal strength and tone. Normal symmetric reflexes. Normal coordination and gait  ECOG PERFORMANCE STATUS: 1 - Symptomatic but completely ambulatory  Blood pressure (!) 178/64, pulse 61, temperature 97.7 F (36.5 C), temperature source Oral, resp. rate 15, weight 185 lb 6.4 oz (84.1 kg), SpO2 99 %.  LABORATORY DATA: Lab Results  Component Value Date   WBC 6.7 02/01/2022   HGB 12.0 02/01/2022   HCT 36.0 02/01/2022   MCV 89.3 02/01/2022   PLT 316 02/01/2022      Chemistry      Component Value Date/Time   NA 137 01/14/2022 0027   K 4.4 01/14/2022 0027   CL 105 01/14/2022 0027   CO2 25 01/14/2022 0027   BUN 19 01/14/2022 0027   CREATININE 0.79 01/14/2022 0027   CREATININE 0.74  11/04/2021 1350      Component Value Date/Time   CALCIUM 8.5 (L) 01/14/2022 0027   ALKPHOS 57 01/14/2022 0027   AST 48 (H) 01/14/2022 0027   AST 22 11/04/2021 1350   ALT 31 01/14/2022 0027   ALT 20 11/04/2021 1350   BILITOT 0.4 01/14/2022 0027   BILITOT 0.5 11/04/2021 1350       RADIOGRAPHIC STUDIES: DG Chest 2 View  Result Date: 01/25/2022 CLINICAL DATA:  Post robotic assisted video lobectomy LEFT lung EXAM: CHEST - 2 VIEW COMPARISON:  01/14/2022 FINDINGS: Normal heart size, mediastinal contours, and pulmonary vascularity. Atherosclerotic calcification aorta. Loculated hydropneumothorax anteriorly in LEFT hemithorax, air-fluid level in the upper lobe region. Lungs otherwise clear. No mediastinal shift or definite infiltrate. Bones demineralized with scattered endplate spur formation thoracic spine. IMPRESSION: Loculated moderate LEFT hydropneumothorax. Critical Value/emergent results were called by telephone at the time of interpretation on 01/25/2022 at 0900 to provider [place] with DR. Remo Lipps [facility] office , who verbally acknowledged these results. Electronically Signed   By: Lavonia Dana M.D.   On: 01/25/2022 09:00  DG Chest 2 View  Result Date: 01/14/2022 CLINICAL DATA:  Status post lobectomy EXAM: CHEST - 2 VIEW COMPARISON:  Chest x-ray dated January 13, 2022. FINDINGS: cardiac and mediastinal contours are unchanged. Postsurgical changes of the left lung. Mild bibasilar opacities, likely due to atelectasis. Mild subcutaneous emphysema at the left-greater-than-right neck bases and along the left chest wall. No pleural effusion or evidence of pneumothorax. IMPRESSION: 1. Mild bibasilar opacities, likely due to atelectasis. 2. No pleural effusion or evidence of pneumothorax. Electronically Signed   By: Yetta Glassman M.D.   On: 01/14/2022 08:25   DG Chest 1V REPEAT Same Day  Result Date: 01/13/2022 CLINICAL DATA:  Chest tube removal. EXAM: CHEST - 1 VIEW SAME DAY  COMPARISON:  One-view chest x-ray 01/13/2022 at 5:58 a.m. FINDINGS: Heart is enlarged. Mild pulmonary vascular congestion is present. Left-sided chest tube was removed. No pneumothorax is present. IMPRESSION: Removal of left-sided chest tube without pneumothorax. Electronically Signed   By: San Morelle M.D.   On: 01/13/2022 10:39   DG Chest Port 1 View  Result Date: 01/13/2022 CLINICAL DATA:  Status post left upper lobectomy. EXAM: PORTABLE CHEST 1 VIEW COMPARISON:  CXR 01/12/22 FINDINGS: Postsurgical changes from left upper lobectomy. Progressive decrease in size of left-sided pneumothorax, now trace at the left lung apex. Thoracostomy tube is positioned along the left lung apex. There is a persistent linear right basilar opacity, favored represent atelectasis. No pleural effusion. Unchanged cardiac and mediastinal contours. The visualized upper abdomen is unremarkable. No radiographically apparent displaced rib fractures. There are degenerative changes of the bilateral AC joints. IMPRESSION: Progressive decrease in size of left-sided pneumothorax, now trace at the left lung apex. Thoracostomy tube is positioned along the left lung apex. Electronically Signed   By: Marin Roberts M.D.   On: 01/13/2022 08:13   DG Chest Port 1 View  Result Date: 01/12/2022 CLINICAL DATA:  Postop EXAM: PORTABLE CHEST 1 VIEW COMPARISON:  None Available. FINDINGS: Postsurgical changes from left upper lobectomy. There is a moderate-sized left-sided pneumothorax with a left-sided thoracostomy tube in place. The tip is positioned at the left lung apex. No pleural effusion. There is a right basilar opacity, which is favored to represent atelectasis in the setting of low lung volumes. Unchanged cardiac and mediastinal contours. Visualized upper abdomen is unremarkable. No radiographically apparent displaced rib fractures. There are degenerative changes of bilateral AC and glenohumeral joints. IMPRESSION: 1. Post surgical  changes from left upper lobectomy. There is a moderate-sized left-sided pneumothorax with a thoracostomy tube in place. 2.  Right basilar atelectasis. Electronically Signed   By: Marin Roberts M.D.   On: 01/12/2022 14:22   DG Chest 2 View  Result Date: 01/11/2022 CLINICAL DATA:  Preop for lung nodule surgery. EXAM: CHEST - 2 VIEW COMPARISON:  PET-CT on 10/20/2021 FINDINGS: Heart size is normal. The lungs are free of focal consolidations and pleural effusions. Nodule LEFT UPPER lobe is again noted, measuring 1.6 centimeters . There is moderate midthoracic degenerative change. No lytic or blastic lesions. IMPRESSION: 1. No evidence for acute cardiopulmonary abnormality. 2. Stable LEFT UPPER lobe nodule. Electronically Signed   By: Nolon Nations M.D.   On: 01/11/2022 10:53    ASSESSMENT AND PLAN: This is a very pleasant 76 years old white female with Stage IA (T1c, N0, M0) non-small cell lung cancer, well-differentiated neuroendocrine, typical carcinoid tumor diagnosed in October 2023. She is status post left upper lobectomy with lymph node dissection under the care of Dr. Roxan Hockey. The patient  is recovering well from her surgery except for the pain on the left side of the chest from the surgical scar. I explained to the patient that there is no survival benefit for any adjuvant systemic chemotherapy or radiation for resected stage Ia carcinoid tumor. I also recommend for the patient to continue on observation as the current standard of care. I will see her back for follow-up visit in 6 months for evaluation with repeat CT scan of the chest for restaging of her disease.  If the next scan showed no concerning findings for disease recurrence, I will see her on an annual basis after that. She was advised to call immediately if she has any other concerning symptoms in the interval. The patient voices understanding of current disease status and treatment options and is in agreement with the current care  plan.  All questions were answered. The patient knows to call the clinic with any problems, questions or concerns. We can certainly see the patient much sooner if necessary.  The total time spent in the appointment was 30 minutes.  Disclaimer: This note was dictated with voice recognition software. Similar sounding words can inadvertently be transcribed and may not be corrected upon review.

## 2022-02-01 NOTE — Addendum Note (Signed)
Addended by: Ardeen Garland on: 02/01/2022 12:02 PM   Modules accepted: Orders

## 2022-02-07 DIAGNOSIS — Z6833 Body mass index (BMI) 33.0-33.9, adult: Secondary | ICD-10-CM | POA: Diagnosis not present

## 2022-02-07 DIAGNOSIS — N3941 Urge incontinence: Secondary | ICD-10-CM | POA: Diagnosis not present

## 2022-02-14 ENCOUNTER — Other Ambulatory Visit: Payer: Self-pay | Admitting: Thoracic Surgery (Cardiothoracic Vascular Surgery)

## 2022-02-14 DIAGNOSIS — C3490 Malignant neoplasm of unspecified part of unspecified bronchus or lung: Secondary | ICD-10-CM

## 2022-02-15 ENCOUNTER — Ambulatory Visit
Admission: RE | Admit: 2022-02-15 | Discharge: 2022-02-15 | Disposition: A | Discharge status: Home / Self Care | Payer: No Typology Code available for payment source | Source: Ambulatory Visit | Attending: Thoracic Surgery (Cardiothoracic Vascular Surgery) | Admitting: Thoracic Surgery (Cardiothoracic Vascular Surgery)

## 2022-02-15 ENCOUNTER — Encounter: Payer: Self-pay | Admitting: Thoracic Surgery (Cardiothoracic Vascular Surgery)

## 2022-02-15 ENCOUNTER — Ambulatory Visit (INDEPENDENT_AMBULATORY_CARE_PROVIDER_SITE_OTHER): Payer: Self-pay | Admitting: Thoracic Surgery (Cardiothoracic Vascular Surgery)

## 2022-02-15 VITALS — BP 150/76 | HR 77 | Resp 20 | Ht 62.0 in | Wt 185.0 lb

## 2022-02-15 DIAGNOSIS — C3490 Malignant neoplasm of unspecified part of unspecified bronchus or lung: Secondary | ICD-10-CM

## 2022-02-15 DIAGNOSIS — J948 Other specified pleural conditions: Secondary | ICD-10-CM | POA: Diagnosis not present

## 2022-02-15 DIAGNOSIS — Z902 Acquired absence of lung [part of]: Secondary | ICD-10-CM

## 2022-02-15 DIAGNOSIS — J939 Pneumothorax, unspecified: Secondary | ICD-10-CM | POA: Diagnosis not present

## 2022-02-15 DIAGNOSIS — Z9889 Other specified postprocedural states: Secondary | ICD-10-CM | POA: Diagnosis not present

## 2022-02-15 NOTE — Progress Notes (Signed)
[address]       [place] [address]             [phone]     HPI: Mrs. Priscilla Taylor returns for a scheduled follow-up visit after recent lobectomy.  Priscilla Taylor is a 76 year old non-smoker who was found to have a lung nodule on a CT for coronary calcium scoring.  It was hypermetabolic on PET/CT.  She underwent a robotic assisted left upper lobectomy on 01/12/2022.  She went home on day 2.  Pathology showed a pT1c,N0 low-grade neuroendocrine tumor.  I saw her in the office on 01/25/2022.  She was having pain along the left costal margin.  She was using Tylenol and oxycodone.  She did not want to try gabapentin or Lyrica.  She was having a little bit of urinary incontinence so we gave her a brief course of ciprofloxacin.  UA did show trace leukocytes.  She is currently walking about a mile a day.  She still has some pain along the left costal margin and also complains of feeling abnormal in that area.  She is currently using 1000 mg of Tylenol twice daily.  No longer using oxycodone.  She is frustrated that she is not progressing faster.  Past Medical History:  Diagnosis Date   Arthritis    Coronary atherosclerosis due to calcified coronary lesion    Eczema    Family history of adverse reaction to anesthesia    post op N&V   Fatty liver    Frequency of urination    at bedtime   GERD (gastroesophageal reflux disease)    History of kidney stones    Hyperlipidemia    Pneumonia    hx of years ago   Reflux     Current Outpatient Medications  Medication Sig Dispense Refill   ALPRAZolam (XANAX) 0.25 MG tablet Take 0.125 mg by mouth at bedtime.     aspirin EC 81 MG tablet Take 1 tablet (81 mg total) by mouth daily. Swallow whole. 30 tablet 12   Cholecalciferol (VITAMIN D3) 25 MCG (1000 UT) CAPS Take 1,000 Units by mouth daily.     citalopram (CELEXA) 20 MG tablet Take 20 mg by mouth daily.     dorzolamide (TRUSOPT) 2 % ophthalmic solution Place 1 drop into  both eyes daily.     latanoprost (XALATAN) 0.005 % ophthalmic solution Place 1 drop into both eyes at bedtime.     Magnesium 250 MG TABS Take 250 mg by mouth daily.     meloxicam (MOBIC) 15 MG tablet Take 15 mg by mouth daily.     Multiple Vitamin (MULTIVITAMIN) tablet Take 1 tablet by mouth daily.     Multiple Vitamins-Minerals (PRESERVISION AREDS 2) CAPS Take 1 capsule by mouth daily.     niacin 500 MG tablet Take 1,000 mg by mouth daily.     Probiotic Product (PROBIOTIC-10 PO) Take 1 capsule by mouth daily.     rosuvastatin (CRESTOR) 20 MG tablet Take 20 mg by mouth daily.     timolol (TIMOPTIC) 0.5 % ophthalmic solution Place 1 drop into both eyes daily.     Turmeric 500 MG CAPS Take 500 mg by mouth daily.     oxyCODONE (OXY IR/ROXICODONE) 5 MG immediate release tablet Take 1 tablet (5 mg total) by mouth every 8 (eight) hours as needed for moderate pain. 10 tablet 0   No current facility-administered medications for this visit.    Physical Exam BP Marland Kitchen)  150/76   Pulse 77   Resp 20   Ht 5\' 2"  (1.575 m)   Wt 185 lb (83.9 kg)   SpO2 95%   BMI 33.66 kg/m  76 year old woman in no acute distress Alert and oriented x3 with no focal deficits Lungs diminished at left base but otherwise clear Cardiac regular rate and rhythm Incisions clean dry intact  Diagnostic Tests: CHEST - 2 VIEW   COMPARISON:  01/25/2022   FINDINGS: Cardiomediastinal contours and hilar structures are stable. Elevated LEFT hemidiaphragm following partial lung resection in the LEFT chest.   Resolution of pneumothorax component of the hydropneumothorax seen on the prior study. "veiled" opacity over the LEFT chest without consolidation. RIGHT chest is clear. No consolidative changes in the LEFT chest.   Skeletal structures are unremarkable to the extent evaluated on limited evaluation.   IMPRESSION: 1. Resolution of pneumothorax component of the LEFT hydropneumothorax seen on the prior study. Persistent  pleural fluid along the anterior LEFT chest suspected based on density on the AP projection and appearance of lateral view. 2. Elevated LEFT hemidiaphragm following partial lung resection in the LEFT chest.     Electronically Signed   By: Zetta Bills M.D.   On: 02/15/2022 12:19 I personally reviewed the chest x-ray images.  Postoperative changes.  No concerning findings.  Impression: Priscilla Taylor is a 76 year old non-smoker who was found to have a lung nodule on a CT for coronary calcium scoring.  It was hypermetabolic on PET/CT.  She underwent a robotic assisted left upper lobectomy on 01/12/2022.  She went home on day 2.  Pathology showed a pT1c,N0 low-grade neuroendocrine tumor.  She is doing extremely well.  Her exercise tolerance is for better than most this early after surgery.  It will take a while for her to get back to her baseline and she may never quite get 100% to that since she had a lung resection.  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  I again discussed using gabapentin or Lyrica.  She does not want to do that.  Told her she can use Tylenol 1000 mg 3-4 times a day if needed.  She thinks she can get by with that.  She saw Dr. Julien Nordmann.  No adjuvant therapy is indicated.  He will do the primary follow-up.  There are no restrictions on her activities.  Plan: Return in 6 weeks to check on progress.  Priscilla Nakayama, MD [facility] [phone]

## 2022-04-04 ENCOUNTER — Other Ambulatory Visit: Payer: Self-pay | Admitting: Thoracic Surgery (Cardiothoracic Vascular Surgery)

## 2022-04-04 DIAGNOSIS — R911 Solitary pulmonary nodule: Secondary | ICD-10-CM

## 2022-04-05 ENCOUNTER — Encounter: Payer: Self-pay | Admitting: Thoracic Surgery (Cardiothoracic Vascular Surgery)

## 2022-04-05 ENCOUNTER — Ambulatory Visit
Admission: RE | Admit: 2022-04-05 | Discharge: 2022-04-05 | Disposition: A | Discharge status: Home / Self Care | Payer: No Typology Code available for payment source | Source: Ambulatory Visit | Attending: Thoracic Surgery (Cardiothoracic Vascular Surgery) | Admitting: Thoracic Surgery (Cardiothoracic Vascular Surgery)

## 2022-04-05 ENCOUNTER — Ambulatory Visit (INDEPENDENT_AMBULATORY_CARE_PROVIDER_SITE_OTHER): Payer: Self-pay | Admitting: Thoracic Surgery (Cardiothoracic Vascular Surgery)

## 2022-04-05 VITALS — BP 153/77 | HR 72 | Resp 18 | Ht 62.0 in | Wt 183.0 lb

## 2022-04-05 DIAGNOSIS — Z902 Acquired absence of lung [part of]: Secondary | ICD-10-CM

## 2022-04-05 DIAGNOSIS — R911 Solitary pulmonary nodule: Secondary | ICD-10-CM

## 2022-04-05 DIAGNOSIS — J9811 Atelectasis: Secondary | ICD-10-CM | POA: Diagnosis not present

## 2022-04-05 NOTE — Progress Notes (Signed)
[address]       [place] [address]             [phone]     HPI: Priscilla Taylor returns for follow-up after her previous left upper lobectomy.  Priscilla Taylor is a 77 year old woman who was a lifelong non-smoker.  She was found to have a lung nodule on a CT for coronary calcium screening.  She underwent robotic assisted left upper lobectomy on 01/12/2022.  Pathology showed a T1c, N0, stage Ia low-grade neuroendocrine tumor.  Her postoperative course was uncomplicated and she went home on day 2.  She saw Dr. Julien Nordmann.  No adjuvant therapy is indicated.  She is feeling well.  She still has some bulging in the left upper quadrant and some unusual sensations although not really pain.  Overall she feels well.  Past Medical History:  Diagnosis Date   Arthritis    Coronary atherosclerosis due to calcified coronary lesion    Eczema    Family history of adverse reaction to anesthesia    post op N&V   Fatty liver    Frequency of urination    at bedtime   GERD (gastroesophageal reflux disease)    History of kidney stones    Hyperlipidemia    Pneumonia    hx of years ago   Reflux     Current Outpatient Medications  Medication Sig Dispense Refill   ALPRAZolam (XANAX) 0.25 MG tablet Take 0.125 mg by mouth at bedtime.     aspirin EC 81 MG tablet Take 1 tablet (81 mg total) by mouth daily. Swallow whole. 30 tablet 12   Cholecalciferol (VITAMIN D3) 25 MCG (1000 UT) CAPS Take 1,000 Units by mouth daily.     citalopram (CELEXA) 20 MG tablet Take 20 mg by mouth daily.     dorzolamide (TRUSOPT) 2 % ophthalmic solution Place 1 drop into both eyes daily.     latanoprost (XALATAN) 0.005 % ophthalmic solution Place 1 drop into both eyes at bedtime.     meloxicam (MOBIC) 15 MG tablet Take 15 mg by mouth daily.     Multiple Vitamin (MULTIVITAMIN) tablet Take 1 tablet by mouth daily.     Multiple Vitamins-Minerals (PRESERVISION AREDS 2) CAPS Take 1 capsule by mouth daily.      niacin 500 MG tablet Take 1,000 mg by mouth daily.     Probiotic Product (PROBIOTIC-10 PO) Take 1 capsule by mouth daily.     rosuvastatin (CRESTOR) 20 MG tablet Take 20 mg by mouth daily.     timolol (TIMOPTIC) 0.5 % ophthalmic solution Place 1 drop into both eyes daily.     Turmeric 500 MG CAPS Take 500 mg by mouth daily.     No current facility-administered medications for this visit.    Physical Exam BP (!) 153/77 (BP Location: Left Arm, Patient Position: Sitting)   Pulse 72   Resp 18   Ht 5\' 2"  (1.575 m)   Wt 183 lb (83 kg)   SpO2 92% Comment: RA  BMI 33.63 kg/m  78 year old woman in no acute distress Alert and oriented x 3 with no focal deficits Lungs slightly diminished at left base but otherwise clear Cardiac regular rate and rhythm Incisions well-healed  Diagnostic Tests: I personally reviewed her chest x-ray images.  Postoperative changes.  No worrisome findings.  Impression: Priscilla Taylor is a 77 year old non-smoker who was incidentally found to have a lung nodule on a CT for coronary calcium screening.  It turned out to be a low-grade neuroendocrine (carcinoid) tumor.  She underwent a left upper lobectomy.  Pathology showed a T1c, N0, stage Ia low-grade neuroendocrine tumor.  She is now about 2 and half months out from surgery.  She is doing well.  She does have some paresthesias but is not requiring any pain medication.  She feels well and is not having any respiratory issues.  She is anxious to resume full activities.  She may do so.  Plan: I will plan to see her back in about 5 to 6 months after her next CT scan for a final follow-up visit She will call if she has any issues with which I can be of assistance.  [name], MD [facility] [phone]

## 2022-04-14 DIAGNOSIS — Z6832 Body mass index (BMI) 32.0-32.9, adult: Secondary | ICD-10-CM | POA: Diagnosis not present

## 2022-04-14 DIAGNOSIS — J069 Acute upper respiratory infection, unspecified: Secondary | ICD-10-CM | POA: Diagnosis not present

## 2022-05-30 DIAGNOSIS — Z1231 Encounter for screening mammogram for malignant neoplasm of breast: Secondary | ICD-10-CM | POA: Diagnosis not present

## 2022-06-05 DIAGNOSIS — R42 Dizziness and giddiness: Secondary | ICD-10-CM | POA: Diagnosis not present

## 2022-06-06 ENCOUNTER — Telehealth: Payer: Self-pay

## 2022-06-06 DIAGNOSIS — J019 Acute sinusitis, unspecified: Secondary | ICD-10-CM | POA: Diagnosis not present

## 2022-06-06 DIAGNOSIS — Z6833 Body mass index (BMI) 33.0-33.9, adult: Secondary | ICD-10-CM | POA: Diagnosis not present

## 2022-06-06 DIAGNOSIS — R531 Weakness: Secondary | ICD-10-CM | POA: Diagnosis not present

## 2022-06-06 DIAGNOSIS — I700 Atherosclerosis of aorta: Secondary | ICD-10-CM | POA: Diagnosis not present

## 2022-06-06 NOTE — Telephone Encounter (Signed)
Please get a copy of that EKG for my review.  Priscilla Taylor [place], DO, [facility]

## 2022-06-06 NOTE — Telephone Encounter (Signed)
Patient had an EKG at the [facility] walk in clinic yesterday. They did an EKG that showed up abnormal for prolonged P-R interval. She is concerned and wants to know if this warrants her rushing in today or tomorrow. She said this wasn't present at her last appointment with you, so she is concerned. She is going out of town tomorrow or Wednesday and is wanting to see you before then if you think it is necessary.

## 2022-06-07 NOTE — Telephone Encounter (Signed)
Called the [facility] location that did the EKG. They do not open until 11:00, I left a VM and will follow up later.

## 2022-06-12 NOTE — Telephone Encounter (Signed)
EKG from [place] received.   EKG From 06/05/2022 notes NSR w/ 61bpm, TWI in lead aVL, no underlying injury pattern, no significant change compared to EKG dated 01/10/2022.   Rex Kras, [place], [facility]  Pager:  [phone] Office: [phone]

## 2022-06-15 NOTE — Telephone Encounter (Signed)
I advised patient of your review of EKG from [facility]. She wants to be reassured that you saw no afib on her EKG from [place]? I did not see any mention of Afib in your review. She is concerned about prolonged P-R interval.

## 2022-06-15 NOTE — Telephone Encounter (Signed)
Can we tell her no A-Fib? This was her main concern when she called.

## 2022-06-15 NOTE — Telephone Encounter (Signed)
Let patient know there was no a-fib. She acknowledged understanding and had no further questions.

## 2022-06-15 NOTE — Telephone Encounter (Signed)
Yes, no afib on the EKG [facility] sent over.   ST

## 2022-06-15 NOTE — Telephone Encounter (Signed)
Will discuss at the next office visit.   Dr. Terri Skains

## 2022-06-29 DIAGNOSIS — R7303 Prediabetes: Secondary | ICD-10-CM | POA: Diagnosis not present

## 2022-06-29 DIAGNOSIS — Z79899 Other long term (current) drug therapy: Secondary | ICD-10-CM | POA: Diagnosis not present

## 2022-06-29 DIAGNOSIS — E782 Mixed hyperlipidemia: Secondary | ICD-10-CM | POA: Diagnosis not present

## 2022-07-06 DIAGNOSIS — D3131 Benign neoplasm of right choroid: Secondary | ICD-10-CM | POA: Diagnosis not present

## 2022-07-06 DIAGNOSIS — Z0100 Encounter for examination of eyes and vision without abnormal findings: Secondary | ICD-10-CM | POA: Diagnosis not present

## 2022-07-06 DIAGNOSIS — H401132 Primary open-angle glaucoma, bilateral, moderate stage: Secondary | ICD-10-CM | POA: Diagnosis not present

## 2022-07-07 DIAGNOSIS — E669 Obesity, unspecified: Secondary | ICD-10-CM | POA: Diagnosis not present

## 2022-07-07 DIAGNOSIS — Z6833 Body mass index (BMI) 33.0-33.9, adult: Secondary | ICD-10-CM | POA: Diagnosis not present

## 2022-07-07 DIAGNOSIS — I700 Atherosclerosis of aorta: Secondary | ICD-10-CM | POA: Diagnosis not present

## 2022-07-07 DIAGNOSIS — M459 Ankylosing spondylitis of unspecified sites in spine: Secondary | ICD-10-CM | POA: Diagnosis not present

## 2022-07-07 DIAGNOSIS — F411 Generalized anxiety disorder: Secondary | ICD-10-CM | POA: Diagnosis not present

## 2022-07-07 DIAGNOSIS — M8588 Other specified disorders of bone density and structure, other site: Secondary | ICD-10-CM | POA: Diagnosis not present

## 2022-07-07 DIAGNOSIS — Z0000 Encounter for general adult medical examination without abnormal findings: Secondary | ICD-10-CM | POA: Diagnosis not present

## 2022-07-07 DIAGNOSIS — E782 Mixed hyperlipidemia: Secondary | ICD-10-CM | POA: Diagnosis not present

## 2022-07-07 DIAGNOSIS — Z79899 Other long term (current) drug therapy: Secondary | ICD-10-CM | POA: Diagnosis not present

## 2022-07-07 DIAGNOSIS — R7303 Prediabetes: Secondary | ICD-10-CM | POA: Diagnosis not present

## 2022-07-14 DIAGNOSIS — M9901 Segmental and somatic dysfunction of cervical region: Secondary | ICD-10-CM | POA: Diagnosis not present

## 2022-07-14 DIAGNOSIS — M47892 Other spondylosis, cervical region: Secondary | ICD-10-CM | POA: Diagnosis not present

## 2022-07-14 DIAGNOSIS — M47896 Other spondylosis, lumbar region: Secondary | ICD-10-CM | POA: Diagnosis not present

## 2022-07-14 DIAGNOSIS — M9903 Segmental and somatic dysfunction of lumbar region: Secondary | ICD-10-CM | POA: Diagnosis not present

## 2022-07-20 DIAGNOSIS — M47896 Other spondylosis, lumbar region: Secondary | ICD-10-CM | POA: Diagnosis not present

## 2022-07-20 DIAGNOSIS — M47892 Other spondylosis, cervical region: Secondary | ICD-10-CM | POA: Diagnosis not present

## 2022-07-20 DIAGNOSIS — M9901 Segmental and somatic dysfunction of cervical region: Secondary | ICD-10-CM | POA: Diagnosis not present

## 2022-07-20 DIAGNOSIS — M9903 Segmental and somatic dysfunction of lumbar region: Secondary | ICD-10-CM | POA: Diagnosis not present

## 2022-07-29 ENCOUNTER — Inpatient Hospital Stay: Payer: No Typology Code available for payment source | Attending: Internal Medicine

## 2022-07-29 ENCOUNTER — Ambulatory Visit (HOSPITAL_COMMUNITY)
Admission: RE | Admit: 2022-07-29 | Discharge: 2022-07-29 | Disposition: A | Discharge status: Home / Self Care | Payer: No Typology Code available for payment source | Source: Ambulatory Visit | Attending: Internal Medicine | Admitting: Internal Medicine

## 2022-07-29 DIAGNOSIS — I700 Atherosclerosis of aorta: Secondary | ICD-10-CM | POA: Diagnosis not present

## 2022-07-29 DIAGNOSIS — E785 Hyperlipidemia, unspecified: Secondary | ICD-10-CM | POA: Diagnosis not present

## 2022-07-29 DIAGNOSIS — I2510 Atherosclerotic heart disease of native coronary artery without angina pectoris: Secondary | ICD-10-CM | POA: Diagnosis not present

## 2022-07-29 DIAGNOSIS — C3490 Malignant neoplasm of unspecified part of unspecified bronchus or lung: Secondary | ICD-10-CM | POA: Insufficient documentation

## 2022-07-29 DIAGNOSIS — K760 Fatty (change of) liver, not elsewhere classified: Secondary | ICD-10-CM | POA: Diagnosis not present

## 2022-07-29 DIAGNOSIS — Z87442 Personal history of urinary calculi: Secondary | ICD-10-CM | POA: Insufficient documentation

## 2022-07-29 DIAGNOSIS — C3412 Malignant neoplasm of upper lobe, left bronchus or lung: Secondary | ICD-10-CM | POA: Diagnosis not present

## 2022-07-29 DIAGNOSIS — Z7982 Long term (current) use of aspirin: Secondary | ICD-10-CM | POA: Diagnosis not present

## 2022-07-29 DIAGNOSIS — Z791 Long term (current) use of non-steroidal anti-inflammatories (NSAID): Secondary | ICD-10-CM | POA: Insufficient documentation

## 2022-07-29 DIAGNOSIS — K219 Gastro-esophageal reflux disease without esophagitis: Secondary | ICD-10-CM | POA: Diagnosis not present

## 2022-07-29 DIAGNOSIS — Z79899 Other long term (current) drug therapy: Secondary | ICD-10-CM | POA: Diagnosis not present

## 2022-07-29 LAB — CBC WITH DIFFERENTIAL (CANCER CENTER ONLY)
Abs Immature Granulocytes: 0.02 10*3/uL (ref 0.00–0.07)
Basophils Absolute: 0.1 10*3/uL (ref 0.0–0.1)
Basophils Relative: 1 %
Eosinophils Absolute: 0.4 10*3/uL (ref 0.0–0.5)
Eosinophils Relative: 6 %
HCT: 38.6 % (ref 36.0–46.0)
Hemoglobin: 12.9 g/dL (ref 12.0–15.0)
Immature Granulocytes: 0 %
Lymphocytes Relative: 22 %
Lymphs Abs: 1.4 10*3/uL (ref 0.7–4.0)
MCH: 30.1 pg (ref 26.0–34.0)
MCHC: 33.4 g/dL (ref 30.0–36.0)
MCV: 90 fL (ref 80.0–100.0)
Monocytes Absolute: 0.5 10*3/uL (ref 0.1–1.0)
Monocytes Relative: 7 %
Neutro Abs: 4.3 10*3/uL (ref 1.7–7.7)
Neutrophils Relative %: 64 %
Platelet Count: 254 10*3/uL (ref 150–400)
RBC: 4.29 MIL/uL (ref 3.87–5.11)
RDW: 13.6 % (ref 11.5–15.5)
WBC Count: 6.7 10*3/uL (ref 4.0–10.5)
nRBC: 0 % (ref 0.0–0.2)

## 2022-07-29 LAB — CMP (CANCER CENTER ONLY)
ALT: 19 U/L (ref 0–44)
AST: 22 U/L (ref 15–41)
Albumin: 4.3 g/dL (ref 3.5–5.0)
Alkaline Phosphatase: 67 U/L (ref 38–126)
Anion gap: 7 (ref 5–15)
BUN: 18 mg/dL (ref 8–23)
CO2: 27 mmol/L (ref 22–32)
Calcium: 9.1 mg/dL (ref 8.9–10.3)
Chloride: 105 mmol/L (ref 98–111)
Creatinine: 0.66 mg/dL (ref 0.44–1.00)
GFR, Estimated: >60 mL/min (ref >60)
Glucose, Bld: 147 mg/dL — ABNORMAL HIGH (ref 70–99)
Potassium: 4.2 mmol/L (ref 3.5–5.1)
Sodium: 139 mmol/L (ref 135–145)
Total Bilirubin: 0.5 mg/dL (ref 0.3–1.2)
Total Protein: 6.9 g/dL (ref 6.5–8.1)

## 2022-07-29 MED ORDER — IOHEXOL 300 MG/ML  SOLN
75.0000 mL | Freq: Once | INTRAMUSCULAR | Status: AC | PRN
  Administered 2022-07-29: 75 mL via INTRAVENOUS

## 2022-07-29 MED ORDER — SODIUM CHLORIDE (PF) 0.9 % IJ SOLN
INTRAMUSCULAR | Status: AC
  Filled 2022-07-29: qty 50

## 2022-07-30 ENCOUNTER — Other Ambulatory Visit: Payer: Self-pay | Admitting: Cardiology

## 2022-07-30 DIAGNOSIS — I2510 Atherosclerotic heart disease of native coronary artery without angina pectoris: Secondary | ICD-10-CM

## 2022-07-30 DIAGNOSIS — R931 Abnormal findings on diagnostic imaging of heart and coronary circulation: Secondary | ICD-10-CM

## 2022-07-30 DIAGNOSIS — I700 Atherosclerosis of aorta: Secondary | ICD-10-CM

## 2022-08-02 ENCOUNTER — Other Ambulatory Visit: Payer: Self-pay

## 2022-08-02 ENCOUNTER — Inpatient Hospital Stay (HOSPITAL_BASED_OUTPATIENT_CLINIC_OR_DEPARTMENT_OTHER): Payer: No Typology Code available for payment source | Admitting: Internal Medicine

## 2022-08-02 VITALS — BP 142/43 | HR 67 | Temp 97.6°F | Resp 13 | Wt 187.0 lb

## 2022-08-02 DIAGNOSIS — C3412 Malignant neoplasm of upper lobe, left bronchus or lung: Secondary | ICD-10-CM | POA: Diagnosis not present

## 2022-08-02 DIAGNOSIS — C7A090 Malignant carcinoid tumor of the bronchus and lung: Secondary | ICD-10-CM | POA: Diagnosis not present

## 2022-08-02 NOTE — Progress Notes (Signed)
[facility] Telephone:([phone]   Fax:([phone]  OFFICE PROGRESS NOTE  [name], MD [address] [place] [address]  DIAGNOSIS: Stage IA (T1c, N0, M0) non-small cell lung cancer, well-differentiated neuroendocrine, typical carcinoid tumor diagnosed in October 2023  PRIOR THERAPY: Status post left upper lobectomy with lymph node dissection under the care of Dr. Dorris Fetch on January 12, 2022.  CURRENT THERAPY: Observation.  INTERVAL HISTORY: Priscilla Taylor 77 y.o. female returns to the clinic today for 6 months follow-up visit.  The patient is feeling fine today with no concerning complaints.  She denied having any shortness of breath, cough or hemoptysis but continues to have intermittent pain on the left side of the chest from the surgical scar.  She has no recent weight loss or night sweats.  She has no nausea, vomiting, diarrhea or constipation.  She has no headache or visual changes.  She is here today for evaluation and repeat blood work.  MEDICAL HISTORY: Past Medical History:  Diagnosis Date   Arthritis    Coronary atherosclerosis due to calcified coronary lesion    Eczema    Family history of adverse reaction to anesthesia    post op N&V   Fatty liver    Frequency of urination    at bedtime   GERD (gastroesophageal reflux disease)    History of kidney stones    Hyperlipidemia    Pneumonia    hx of years ago   Reflux     ALLERGIES:  is allergic to sulfa antibiotics, amoxicillin, lipitor [atorvastatin], and zocor [simvastatin].  MEDICATIONS:  Current Outpatient Medications  Medication Sig Dispense Refill   ALPRAZolam (XANAX) 0.25 MG tablet Take 0.125 mg by mouth at bedtime.     ASPIRIN LOW DOSE 81 MG tablet TAKE 1 TABLET (81 MG TOTAL) BY MOUTH DAILY. SWALLOW WHOLE. 90 tablet 4   Cholecalciferol (VITAMIN D3) 25 MCG (1000 UT) CAPS Take 1,000 Units by mouth daily.     citalopram (CELEXA) 20 MG tablet Take 20 mg by  mouth daily.     dorzolamide (TRUSOPT) 2 % ophthalmic solution Place 1 drop into both eyes daily.     latanoprost (XALATAN) 0.005 % ophthalmic solution Place 1 drop into both eyes at bedtime.     meloxicam (MOBIC) 15 MG tablet Take 15 mg by mouth daily.     Multiple Vitamin (MULTIVITAMIN) tablet Take 1 tablet by mouth daily.     Multiple Vitamins-Minerals (PRESERVISION AREDS 2) CAPS Take 1 capsule by mouth daily.     niacin 500 MG tablet Take 1,000 mg by mouth daily.     Probiotic Product (PROBIOTIC-10 PO) Take 1 capsule by mouth daily.     rosuvastatin (CRESTOR) 20 MG tablet Take 20 mg by mouth daily.     timolol (TIMOPTIC) 0.5 % ophthalmic solution Place 1 drop into both eyes daily.     Turmeric 500 MG CAPS Take 500 mg by mouth daily.     No current facility-administered medications for this visit.    SURGICAL HISTORY:  Past Surgical History:  Procedure Laterality Date   APPENDECTOMY     BREAST BIOPSY Left 03/28/1984   CATARACT EXTRACTION Bilateral    CESAREAN SECTION     INTERCOSTAL NERVE BLOCK Left 01/12/2022   Procedure: INTERCOSTAL NERVE BLOCK;  Surgeon: Loreli Slot, MD;  Location: [facility] OR;  Service: Thoracic;  Laterality: Left;   JOINT REPLACEMENT Left 03/28/2002   knee   LYMPH  NODE DISSECTION Left 01/12/2022   Procedure: LYMPH NODE DISSECTION;  Surgeon: Loreli Slot, MD;  Location: [facility] OR;  Service: Thoracic;  Laterality: Left;   TOTAL KNEE ARTHROPLASTY Right 06/11/2012   Procedure: TOTAL KNEE ARTHROPLASTY;  Surgeon: Dannielle Huh, MD;  Location: [facility] OR;  Service: Orthopedics;  Laterality: Right;   TOTAL KNEE ARTHROPLASTY Right 06/11/2012   Dr Sherlean Foot    REVIEW OF SYSTEMS:  A comprehensive review of systems was negative except for: Respiratory: positive for pleurisy/chest pain   PHYSICAL EXAMINATION: General appearance: alert, cooperative, and no distress Head: Normocephalic, without obvious abnormality, atraumatic Neck: no adenopathy, no JVD, supple,  symmetrical, trachea midline, and thyroid not enlarged, symmetric, no tenderness/mass/nodules Lymph nodes: Cervical, supraclavicular, and axillary nodes normal. Resp: clear to auscultation bilaterally Back: symmetric, no curvature. ROM normal. No CVA tenderness. Cardio: regular rate and rhythm, S1, S2 normal, no murmur, click, rub or gallop GI: soft, non-tender; bowel sounds normal; no masses,  no organomegaly Extremities: extremities normal, atraumatic, no cyanosis or edema  ECOG PERFORMANCE STATUS: 1 - Symptomatic but completely ambulatory  Blood pressure (!) 142/43, pulse 67, temperature 97.6 F (36.4 C), temperature source Oral, resp. rate 13, weight 187 lb (84.8 kg), SpO2 98 %.  LABORATORY DATA: Lab Results  Component Value Date   WBC 6.7 07/29/2022   HGB 12.9 07/29/2022   HCT 38.6 07/29/2022   MCV 90.0 07/29/2022   PLT 254 07/29/2022      Chemistry      Component Value Date/Time   NA 139 07/29/2022 0834   K 4.2 07/29/2022 0834   CL 105 07/29/2022 0834   CO2 27 07/29/2022 0834   BUN 18 07/29/2022 0834   CREATININE 0.66 07/29/2022 0834      Component Value Date/Time   CALCIUM 9.1 07/29/2022 0834   ALKPHOS 67 07/29/2022 0834   AST 22 07/29/2022 0834   ALT 19 07/29/2022 0834   BILITOT 0.5 07/29/2022 0834       RADIOGRAPHIC STUDIES: CT Chest W Contrast  Result Date: 08/02/2022 CLINICAL DATA:  Non-small cell lung cancer staging; * Tracking Code: BO * EXAM: CT CHEST WITH CONTRAST TECHNIQUE: Multidetector CT imaging of the chest was performed during intravenous contrast administration. RADIATION DOSE REDUCTION: This exam was performed according to the departmental dose-optimization program which includes automated exposure control, adjustment of the mA and/or kV according to patient size and/or use of iterative reconstruction technique. CONTRAST:  75mL OMNIPAQUE IOHEXOL 300 MG/ML  SOLN COMPARISON:  Chest CT dated April 25, 2021 FINDINGS: Cardiovascular: Normal heart size.  No pericardial effusion. Normal caliber thoracic aorta with mild atherosclerotic disease. Mild coronary artery calcifications. Mediastinum/Nodes: Esophagus and thyroid are unremarkable. No enlarged lymph nodes seen in the chest. Lungs/Pleura: Central airways are patent. Interval left upper lobectomy. New subpleural opacity of the left lower lobe located on series 7, image 108, a somewhat linear configuration. No pleural effusion. Upper Abdomen: No acute abnormality. Musculoskeletal: No chest wall abnormality. No acute or significant osseous findings. IMPRESSION: 1. Interval left upper lobectomy. No evidence of recurrent or metastatic disease. 2. New subpleural opacity of the left lower lobe, likely scarring or atelectasis. Recommend attention on short-term interval follow-up. 3. Mild coronary artery calcifications and aortic Atherosclerosis (ICD10-I70.0). Electronically Signed   By: Allegra Lai M.D.   On: 08/02/2022 11:46    ASSESSMENT AND PLAN: This is a very pleasant 77 years old white female with Stage IA (T1c, N0, M0) non-small cell lung cancer, well-differentiated neuroendocrine, typical carcinoid tumor diagnosed in  October 2023. She is status post left upper lobectomy with lymph node dissection under the care of Dr. Dorris Fetch. The patient is currently on observation and she is feeling fine with no concerning complaints. She had repeat CT scan of the chest performed recently.  I personally and independently reviewed the scan and discussed the result with the patient today. Her scan showed no concerning findings for disease recurrence or metastasis. I recommended for her to continue on observation with repeat CT scan of the chest in 1 year. The patient was advised to call immediately if she has any concerning symptoms in the interval. The patient voices understanding of current disease status and treatment options and is in agreement with the current care plan.  All questions were answered. The  patient knows to call the clinic with any problems, questions or concerns. We can certainly see the patient much sooner if necessary.  The total time spent in the appointment was 20 minutes.  Disclaimer: This note was dictated with voice recognition software. Similar sounding words can inadvertently be transcribed and may not be corrected upon review.

## 2022-08-03 DIAGNOSIS — H401132 Primary open-angle glaucoma, bilateral, moderate stage: Secondary | ICD-10-CM | POA: Diagnosis not present

## 2022-08-08 DIAGNOSIS — M9901 Segmental and somatic dysfunction of cervical region: Secondary | ICD-10-CM | POA: Diagnosis not present

## 2022-08-08 DIAGNOSIS — M9903 Segmental and somatic dysfunction of lumbar region: Secondary | ICD-10-CM | POA: Diagnosis not present

## 2022-08-08 DIAGNOSIS — M47896 Other spondylosis, lumbar region: Secondary | ICD-10-CM | POA: Diagnosis not present

## 2022-08-08 DIAGNOSIS — M47892 Other spondylosis, cervical region: Secondary | ICD-10-CM | POA: Diagnosis not present

## 2022-08-09 ENCOUNTER — Encounter: Payer: Self-pay | Admitting: Thoracic Surgery (Cardiothoracic Vascular Surgery)

## 2022-08-09 ENCOUNTER — Ambulatory Visit: Payer: No Typology Code available for payment source | Admitting: Thoracic Surgery (Cardiothoracic Vascular Surgery)

## 2022-08-09 VITALS — BP 145/81 | HR 64 | Resp 18 | Ht 62.0 in | Wt 182.0 lb

## 2022-08-09 DIAGNOSIS — Z902 Acquired absence of lung [part of]: Secondary | ICD-10-CM

## 2022-08-09 NOTE — Progress Notes (Signed)
[address]       [place] [address]             [phone]     HPI: Mrs. Lansden returns for a scheduled follow-up after previous left upper lobectomy.  Priscilla Taylor is a 77 year old lifelong non-smoker who was found to have a lung nodule on a CT for coronary calcium screening.  She underwent a robotic assisted left upper lobectomy on 01/12/2022.  She had a T1c, N0, stage Ia low-grade neuroendocrine tumor.  Her postoperative course was uncomplicated.  I last saw her in January.  She was doing well at that time.  In the interim since her last visit she has been feeling well.  Not taking any pain medication.  She saw Dr. Shirline Frees last week and a CT showed some scarring versus atelectasis of the left lung base.  She had questions about that.  Past Medical History:  Diagnosis Date   Arthritis    Coronary atherosclerosis due to calcified coronary lesion    Eczema    Family history of adverse reaction to anesthesia    post op N&V   Fatty liver    Frequency of urination    at bedtime   GERD (gastroesophageal reflux disease)    History of kidney stones    Hyperlipidemia    Pneumonia    hx of years ago   Reflux     Current Outpatient Medications  Medication Sig Dispense Refill   ALPRAZolam (XANAX) 0.25 MG tablet Take 0.125 mg by mouth at bedtime.     ASPIRIN LOW DOSE 81 MG tablet TAKE 1 TABLET (81 MG TOTAL) BY MOUTH DAILY. SWALLOW WHOLE. 90 tablet 4   Cholecalciferol (VITAMIN D3) 25 MCG (1000 UT) CAPS Take 1,000 Units by mouth daily.     citalopram (CELEXA) 20 MG tablet Take 20 mg by mouth daily.     dorzolamide (TRUSOPT) 2 % ophthalmic solution Place 1 drop into both eyes daily.     latanoprost (XALATAN) 0.005 % ophthalmic solution Place 1 drop into both eyes at bedtime.     meloxicam (MOBIC) 15 MG tablet Take 15 mg by mouth daily.     Multiple Vitamin (MULTIVITAMIN) tablet Take 1 tablet by mouth daily.     Multiple Vitamins-Minerals (PRESERVISION  AREDS 2) CAPS Take 1 capsule by mouth daily.     niacin 500 MG tablet Take 1,000 mg by mouth daily.     Probiotic Product (PROBIOTIC-10 PO) Take 1 capsule by mouth daily.     rosuvastatin (CRESTOR) 20 MG tablet Take 20 mg by mouth daily.     timolol (TIMOPTIC) 0.5 % ophthalmic solution Place 1 drop into both eyes daily.     Turmeric 500 MG CAPS Take 500 mg by mouth daily.     No current facility-administered medications for this visit.    Physical Exam BP (!) 145/81 (BP Location: Left Arm, Patient Position: Sitting)   Pulse 64   Resp 18   Ht 5\' 2"  (1.575 m)   Wt 182 lb (82.6 kg)   SpO2 95% Comment: RA  BMI 33.52 kg/m  77 year old woman in no acute distress Alert and oriented x 3 with no focal deficits Lungs slightly diminished at left base but otherwise clear Incisions well-healed Cardiac regular rate and rhythm   Diagnostic Tests: CT CHEST WITH CONTRAST   TECHNIQUE: Multidetector CT imaging of the chest was performed during intravenous contrast administration.   RADIATION DOSE REDUCTION: This exam  was performed according to the departmental dose-optimization program which includes automated exposure control, adjustment of the mA and/or kV according to patient size and/or use of iterative reconstruction technique.   CONTRAST:  75mL OMNIPAQUE IOHEXOL 300 MG/ML  SOLN   COMPARISON:  Chest CT dated April 25, 2021   FINDINGS: Cardiovascular: Normal heart size. No pericardial effusion. Normal caliber thoracic aorta with mild atherosclerotic disease. Mild coronary artery calcifications.   Mediastinum/Nodes: Esophagus and thyroid are unremarkable. No enlarged lymph nodes seen in the chest.   Lungs/Pleura: Central airways are patent. Interval left upper lobectomy. New subpleural opacity of the left lower lobe located on series 7, image 108, a somewhat linear configuration. No pleural effusion.   Upper Abdomen: No acute abnormality.   Musculoskeletal: No chest wall  abnormality. No acute or significant osseous findings.   IMPRESSION: 1. Interval left upper lobectomy. No evidence of recurrent or metastatic disease. 2. New subpleural opacity of the left lower lobe, likely scarring or atelectasis. Recommend attention on short-term interval follow-up. 3. Mild coronary artery calcifications and aortic Atherosclerosis (ICD10-I70.0).     Electronically Signed   By: Allegra Lai M.D.   On: 08/02/2022 11:46 I personally reviewed the CT images.  I agree with the finding of a opacity at the base of the left lower lobe.  Likely small area of round atelectasis.  Impression: Priscilla Taylor is a 77 year old lifelong non-smoker who was found to have a lung nodule on a CT for coronary calcium screening.  She underwent a robotic assisted left upper lobectomy on 01/12/2022.  She had a T1c, N0, stage Ia low-grade neuroendocrine tumor.  Her postoperative course was uncomplicated.  Stage Ia low-grade neuroendocrine tumor-now about 6 months out from lobectomy.  CT shows no evidence of recurrence.  There is a new finding in the left lower lobe.  It is a subpleural density.  Likely round atelectasis.  I doubt it is neoplasm.  However given her history, I think we should rescan it in about 6 months.  She is not scheduled to see Dr. Arbutus Ped again for a year so I will have her come back and see me with a CT in the interim.   Plan: Return in 6 months with CT chest  I spent 20 minutes in review of records, images, and in consultation with Mrs. Traeger today. Loreli Slot, MD [facility] [phone]

## 2022-08-17 DIAGNOSIS — J019 Acute sinusitis, unspecified: Secondary | ICD-10-CM | POA: Diagnosis not present

## 2022-08-17 DIAGNOSIS — R059 Cough, unspecified: Secondary | ICD-10-CM | POA: Diagnosis not present

## 2022-08-17 DIAGNOSIS — Z6834 Body mass index (BMI) 34.0-34.9, adult: Secondary | ICD-10-CM | POA: Diagnosis not present

## 2022-08-29 DIAGNOSIS — M9901 Segmental and somatic dysfunction of cervical region: Secondary | ICD-10-CM | POA: Diagnosis not present

## 2022-08-29 DIAGNOSIS — M9903 Segmental and somatic dysfunction of lumbar region: Secondary | ICD-10-CM | POA: Diagnosis not present

## 2022-08-29 DIAGNOSIS — M47896 Other spondylosis, lumbar region: Secondary | ICD-10-CM | POA: Diagnosis not present

## 2022-08-29 DIAGNOSIS — M47892 Other spondylosis, cervical region: Secondary | ICD-10-CM | POA: Diagnosis not present

## 2022-09-21 ENCOUNTER — Ambulatory Visit: Payer: No Typology Code available for payment source | Admitting: Cardiology

## 2022-09-23 ENCOUNTER — Ambulatory Visit: Payer: No Typology Code available for payment source | Admitting: Cardiology

## 2022-09-23 ENCOUNTER — Encounter: Payer: Self-pay | Admitting: Cardiology

## 2022-09-23 VITALS — BP 130/60 | HR 62 | Ht 62.0 in | Wt 182.0 lb

## 2022-09-23 DIAGNOSIS — R7303 Prediabetes: Secondary | ICD-10-CM | POA: Diagnosis not present

## 2022-09-23 DIAGNOSIS — E782 Mixed hyperlipidemia: Secondary | ICD-10-CM

## 2022-09-23 DIAGNOSIS — I2510 Atherosclerotic heart disease of native coronary artery without angina pectoris: Secondary | ICD-10-CM | POA: Diagnosis not present

## 2022-09-23 DIAGNOSIS — I700 Atherosclerosis of aorta: Secondary | ICD-10-CM | POA: Diagnosis not present

## 2022-09-23 DIAGNOSIS — E785 Hyperlipidemia, unspecified: Secondary | ICD-10-CM | POA: Diagnosis not present

## 2022-09-23 DIAGNOSIS — I2584 Coronary atherosclerosis due to calcified coronary lesion: Secondary | ICD-10-CM | POA: Diagnosis not present

## 2022-09-23 MED ORDER — EZETIMIBE 10 MG PO TABS
10.0000 mg | ORAL_TABLET | Freq: Every day | ORAL | 3 refills | Status: DC
Start: 2022-09-23 — End: 2023-11-02

## 2022-09-23 NOTE — Progress Notes (Signed)
ID:  Priscilla Taylor, DOB 1945-07-29, MRN [id number]  PCP:  [name], MD  Cardiologist:  Tessa Lerner, DO, [facility] (established care 10/08/2021)  Date: 09/23/22 Last Office Visit: 12/16/2021  Chief Complaint  Patient presents with   Follow-up    Coronary calcification   HPI  Priscilla Taylor is a 77 y.o. Caucasian female whose past medical history and cardiovascular risk factors include: Prediabetes, hyperlipidemia, aortic atherosclerosis, moderate coronary calcification (total CAC 118.5 AU, 66th percentile as of May 2023).  Prior to establishing care she had a coronary calcium score at the request of her PCP and she was noted to have moderate CAC with a total score of 118.5 AU, 66th percentile.  She was referred to cardiology for further evaluation and statin therapy dose was increased.  She presents today for 1 year follow-up visit. Since last office visit patient is doing well from a cardiovascular standpoint.  In the interim she underwent lobectomy with Dr. Dorris Fetch and has recovered well.  Overall functional capacity remains stable.  FUNCTIONAL STATUS: Walks 1 mile at least 3 days a week.   Remainder of the day she goes to the [facility] and does water aerobics for approximately 2 hours at a time for 2 days of the week.  ALLERGIES: Allergies  Allergen Reactions   Sulfa Antibiotics Hives   Amoxicillin Other (See Comments)    vaginitis   Lipitor [Atorvastatin] Other (See Comments)    ache   Zocor [Simvastatin] Other (See Comments)    ache    MEDICATION LIST PRIOR TO VISIT: Current Meds  Medication Sig   ALPRAZolam (XANAX) 0.25 MG tablet Take 0.125 mg by mouth at bedtime.   ASPIRIN LOW DOSE 81 MG tablet TAKE 1 TABLET (81 MG TOTAL) BY MOUTH DAILY. SWALLOW WHOLE.   Cholecalciferol (VITAMIN D3) 25 MCG (1000 UT) CAPS Take 1,000 Units by mouth daily.   citalopram (CELEXA) 20 MG tablet Take 20 mg by mouth daily.   Cyanocobalamin (VITAMIN B 12 PO) Take 1 tablet by mouth  daily in the afternoon.   dorzolamide (TRUSOPT) 2 % ophthalmic solution Place 1 drop into both eyes daily.   ezetimibe (ZETIA) 10 MG tablet Take 1 tablet (10 mg total) by mouth daily.   latanoprost (XALATAN) 0.005 % ophthalmic solution Place 1 drop into both eyes at bedtime.   meloxicam (MOBIC) 15 MG tablet Take 15 mg by mouth daily.   Multiple Vitamin (MULTIVITAMIN) tablet Take 1 tablet by mouth daily.   Multiple Vitamins-Minerals (PRESERVISION AREDS 2) CAPS Take 1 capsule by mouth daily.   niacin 500 MG tablet Take 1,000 mg by mouth daily.   Probiotic Product (PROBIOTIC-10 PO) Take 1 capsule by mouth daily.   rosuvastatin (CRESTOR) 20 MG tablet Take 20 mg by mouth daily.   timolol (TIMOPTIC) 0.5 % ophthalmic solution Place 1 drop into both eyes daily.   Turmeric 500 MG CAPS Take 500 mg by mouth daily.     PAST MEDICAL HISTORY: Past Medical History:  Diagnosis Date   Arthritis    Coronary atherosclerosis due to calcified coronary lesion    Eczema    Family history of adverse reaction to anesthesia    post op N&V   Fatty liver    Frequency of urination    at bedtime   GERD (gastroesophageal reflux disease)    History of kidney stones    Hyperlipidemia    Pneumonia    hx of years ago   Reflux     PAST  SURGICAL HISTORY: Past Surgical History:  Procedure Laterality Date   APPENDECTOMY     BREAST BIOPSY Left 03/28/1984   CATARACT EXTRACTION Bilateral    CESAREAN SECTION     INTERCOSTAL NERVE BLOCK Left 01/12/2022   Procedure: INTERCOSTAL NERVE BLOCK;  Surgeon: Loreli Slot, MD;  Location: [facility] OR;  Service: Thoracic;  Laterality: Left;   JOINT REPLACEMENT Left 03/28/2002   knee   LYMPH NODE DISSECTION Left 01/12/2022   Procedure: LYMPH NODE DISSECTION;  Surgeon: Loreli Slot, MD;  Location: [facility] OR;  Service: Thoracic;  Laterality: Left;   TOTAL KNEE ARTHROPLASTY Right 06/11/2012   Procedure: TOTAL KNEE ARTHROPLASTY;  Surgeon: Dannielle Huh, MD;  Location: [facility]  OR;  Service: Orthopedics;  Laterality: Right;   TOTAL KNEE ARTHROPLASTY Right 06/11/2012   Dr Sherlean Foot    FAMILY HISTORY: The patient family history includes Alcoholism in her mother; Heart attack in her father; Heart disease in her father and mother.  SOCIAL HISTORY:  The patient  reports that she has never smoked. She has never used smokeless tobacco. She reports current alcohol use of about 1.0 standard drink of alcohol per week. She reports that she does not use drugs.  REVIEW OF SYSTEMS: Review of Systems  Cardiovascular:  Negative for chest pain, claudication, dyspnea on exertion, irregular heartbeat, leg swelling, near-syncope, orthopnea, palpitations, paroxysmal nocturnal dyspnea and syncope.  Respiratory:  Negative for shortness of breath.   Hematologic/Lymphatic: Negative for bleeding problem.  Musculoskeletal:  Negative for muscle cramps and myalgias.  Neurological:  Negative for dizziness and light-headedness.    PHYSICAL EXAM:    09/23/2022    2:29 PM 08/09/2022    2:19 PM 08/02/2022    1:28 PM  Vitals with BMI  Height 5\' 2"  5\' 2"    Weight 182 lbs 182 lbs 187 lbs  BMI 33.28 33.28   Systolic 130 145 161  Diastolic 60 81 43  Pulse 62 64 67    Physical Exam  Constitutional:  Age appropriate, hemodynamically stable, no acute distress.   Neck: No JVD present.  Cardiovascular: Normal rate, regular rhythm, S1 normal and S2 normal. Exam reveals no gallop and no friction rub.  No murmur heard. Pulses:      Dorsalis pedis pulses are 2+ on the right side and 2+ on the left side.       Posterior tibial pulses are 2+ on the right side and 2+ on the left side.  Pulmonary/Chest: Breath sounds normal. She has no wheezes. She has no rales. She exhibits no tenderness.  Abdominal: Soft. Bowel sounds are normal. She exhibits no distension. There is no abdominal tenderness.  Musculoskeletal:        General: No tenderness or edema.  Neurological: She is alert and oriented to person,  place, and time.  Skin: Skin is warm and dry.   CARDIAC DATABASE: EKG: September 23, 2022: Sinus rhythm, 61 bpm, normal axis, without underlying ischemia or injury pattern.  Echocardiogram: 11/12/2021: Normal LV systolic function with visual EF 60-65%. Left ventricle cavity is normal in size. Mild concentric hypertrophy of the left ventricle. Normal global wall motion. Doppler evidence of grade I (impaired) diastolic dysfunction, normal LAP. Calculated EF 65%. Structurally normal trileaflet aortic valve. Mild (Grade I) aortic regurgitation. Structurally normal tricuspid valve with trace regurgitation. No evidence of pulmonary hypertension.   Stress Testing: Lexiscan (with Mod Bruce protocol) Nuclear stress test 11/15/21 Myocardial perfusion is normal. Overall LV systolic function is normal without regional wall motion abnormalities. Stress LV  EF: 49% which is mildly reduced. Rest EF normal at 61%. Abnormal ECG stress. Peak EKG/ECG demonstrated normal sinus rhythm. Greater than 2 mm horizontal ST depression of the inferior leads. The heart rate response was consistent with Regadenoson. The blood pressure response was physiologic. No previous exam available for comparison.  Heart Catheterization: None  Carotid duplex: 12/05/2014 Mild (1-49%) stenosis in the proximal left ICA secondary to smooth but hypoechoic lipid rich atherosclerotic plaque.  Mild intimal thickening of the right without evidence of ICA stenosis. Vertebral arteries are patent and antegrade flow.  Coronary artery calcium score: 08/03/2021: Left Main: 0   LAD: 118   LCx: 0.5   RCA: 0   Total Agatston Score: 118.5   MESA database percentile: 66   AORTA MEASUREMENTS:   Ascending Aorta: 27 mm   Descending Aorta: 21 mm  Incidental detection of a mass in the left upper lobe measuring approximately 1.7 x 1.1 x 1.4 cm. This is potentially representative of a primary lung carcinoma. Further evaluation is recommended by  a full CT of the chest with IV contrast. Additional multi-disciplinary thoracic oncologic evaluation recommended. The nodule is near a branch point of the anterior left upper lobe bronchus and may be approachable by bronchoscopy.   LABORATORY DATA:    Latest Ref Rng & Units 07/29/2022    8:34 AM 02/01/2022   10:58 AM 01/14/2022   12:27 AM  CBC  WBC 4.0 - 10.5 K/uL 6.7  6.7  10.7   Hemoglobin 12.0 - 15.0 g/dL 16.1  09.6  04.5   Hematocrit 36.0 - 46.0 % 38.6  36.0  32.4   Platelets 150 - 400 K/uL 254  316  211        Latest Ref Rng & Units 07/29/2022    8:34 AM 02/01/2022   10:58 AM 01/14/2022   12:27 AM  CMP  Glucose 70 - 99 mg/dL 409  98  811   BUN 8 - 23 mg/dL 18  16  19    Creatinine 0.44 - 1.00 mg/dL 9.14  7.82  9.56   Sodium 135 - 145 mmol/L 139  141  137   Potassium 3.5 - 5.1 mmol/L 4.2  4.2  4.4   Chloride 98 - 111 mmol/L 105  105  105   CO2 22 - 32 mmol/L 27  30  25    Calcium 8.9 - 10.3 mg/dL 9.1  9.2  8.5   Total Protein 6.5 - 8.1 g/dL 6.9  7.0  5.5   Total Bilirubin 0.3 - 1.2 mg/dL 0.5  0.4  0.4   Alkaline Phos 38 - 126 U/L 67  77  57   AST 15 - 41 U/L 22  18  48   ALT 0 - 44 U/L 19  14  31      Lipid Panel  No results found for: "CHOL", "TRIG", "HDL", "CHOLHDL", "VLDL", "LDLCALC", "LDLDIRECT", "LABVLDL"  No components found for: "NTPROBNP" No results for input(s): "PROBNP" in the last 8760 hours. No results for input(s): "TSH" in the last 8760 hours.  BMP Recent Labs    01/14/22 0027 02/01/22 1058 07/29/22 0834  NA 137 141 139  K 4.4 4.2 4.2  CL 105 105 105  CO2 25 30 27   GLUCOSE 115* 98 147*  BUN 19 16 18   CREATININE 0.79 0.64 0.66  CALCIUM 8.5* 9.2 9.1  GFRNONAA >60 >60 >60    HEMOGLOBIN A1C No results found for: "HGBA1C", "MPG"  External Labs:  Date Collected: 06/23/2021 ,  information obtained by referring physician Potassium: 4.6 BUN 19, creatinine 0.68 mg/dL. eGFR: 91 mL/min per 1.73 m Lipid profile: Total cholesterol 153 ,  triglycerides 176 , HDL 61 , LDL 63, non-HDL 92 AST: 27 , ALT: 29 , alkaline phosphatase: 70  Hemoglobin A1c: 5.8  SARS-CoV-2 /FLU/RSV Panel Plus (PCR) (ECL) Reviewed date:04/14/2022 05:05:47 PM Interpretation: Abnormal Performing Lab: Notes/Report: Testing Performed at: [facility], [address], [place], [place] [address] Called to [facility] on 04/14/2022 4:51 PM by Caryl Bis. (SARS-CoV-2)  SARS-CoV-2 POSITIVE NEGATIVE Testing performed by RT-PCR on Cepheid GeneXpert  FLU A NEGATIVE NEGATIVE Testing performed by PCR on Cepheid GeneXpert  FLU B NEGATIVE NEGATIVE Testing performed by PCR on Cepheid GeneXpert  RSV NEGATIVE NEGATIVE Testing performed by PCR on Cepheid GeneXpert  Lipid Panel w/reflex Reviewed date:06/30/2022 07:51:23 AM Interpretation:At goal Performing Lab: Notes/Report: Testing Performed at: [facility], [address], [place], [place] [address]  Cholesterol 150 <200 mg/dL    CHOL/HDL 2.4 0.9-8.1 Ratio    HDLD 63 30-85 mg/dL Values below 40 mg/dL indicate increased risk factor  Triglyceride 146 0-199 mg/dL    NHDL 87 1-914 mg/dL Range dependent upon risk factors.  LDL Chol Calc (NIH) 62 0-99 mg/dL    Comp Metabolic Panel Reviewed date:06/30/2022 07:50:49 AM Interpretation:Normal Performing Lab: Notes/Report: Testing Performed at: [facility], [address], [address], [place], [place] [address]  Glucose 97 70-99 mg/dL    BUN 21 6-21 mg/dL    Creatinine 3.08 6.57-8.46 mg/dl    [id number] 91 >13 calc In accordance with recommendations from NKF-ASN Task Force, [facility] has updated its eGFR calc to the 2021 CKD-EDI equation that estimates kidney function without a race variable;Stage 1 > 90 ML/Min plus Albuminuria;Stage 2 60-89 ML/MIN;Stage 3 30-59 ML/MIN;Stage 4 15-29 ML/MIN;Stage 5 <15 ML/MIN  Sodium 140 136-145 mmol/L    Potassium 4.8 3.5-5.5 mmol/L    Chloride 105 98-107 mmol/L    CO2 28 22-32 mmol/L    Anion Gap 11.7 6.0-20.0  mmol/L    Calcium 9.4 8.6-10.3 mg/dL    CA-corrected 2.44 0.10-27.25 mg/dL    Protein, Total 6.7 3.6-6.4 g/dL    Albumin 4.4 4.0-3.4 g/dL    TBIL 0.5 7.4-2.5 mg/dL    ALP 66 95-638 U/L    AST 24 0-39 U/L    ALT 21 0-52 U/L    Hemoglobin A1c Reviewed date:06/30/2022 07:51:09 AM Interpretation:6.1 Performing Lab: Notes/Report: Testing Performed at: [facility], [address], [place], [place] [address]  eAG 128      Hgb A1c 6.1 4.8-5.6 % Prediabetes: 5.7-6.4%  Diabetes: >/= 6.5%     IMPRESSION:    ICD-10-CM   1. Coronary atherosclerosis due to calcified coronary lesion  I25.10 EKG 12-Lead   I25.84 Lipid Panel With LDL/HDL Ratio    LDL cholesterol, direct    CMP14+EGFR    ezetimibe (ZETIA) 10 MG tablet    2. Atherosclerosis of aorta (HCC)  I70.0 Lipid Panel With LDL/HDL Ratio    LDL cholesterol, direct    CMP14+EGFR    ezetimibe (ZETIA) 10 MG tablet    3. Prediabetes  R73.03     4. Mixed hyperlipidemia  E78.2 Lipid Panel With LDL/HDL Ratio    LDL cholesterol, direct    CMP14+EGFR    ezetimibe (ZETIA) 10 MG tablet       RECOMMENDATIONS: NADEYA DENUNZIO is a 77 y.o. Caucasian female whose past medical history and cardiac risk factors include: Prediabetes, hyperlipidemia, aortic  atherosclerosis, moderate coronary calcification (total CAC 118.5 AU, 66th percentile as of May 2023).  Coronary atherosclerosis due to calcified coronary lesion Atherosclerosis of aorta (HCC) Continue aspirin and statin therapy. Recently had labs with PCP in March 2024, per patient will request records. Has undergone appropriate cardiovascular testing in the recent past. Reemphasized importance of secondary prevention.  Hyperlipidemia, mixed: Did not tolerate the uptitration of rosuvastatin from 20 mg to 40 mg p.o. nightly. No significant change in her lipid profile when compared to prior years. Shared decision was to start Zetia 10 mg p.o. daily. Labs in 6 weeks to reevaluate  therapy  Prediabetes: Currently managed by PCP.  FINAL MEDICATION LIST END OF ENCOUNTER: Meds ordered this encounter  Medications   ezetimibe (ZETIA) 10 MG tablet    Sig: Take 1 tablet (10 mg total) by mouth daily.    Dispense:  90 tablet    Refill:  3    There are no discontinued medications.    Current Outpatient Medications:    ALPRAZolam (XANAX) 0.25 MG tablet, Take 0.125 mg by mouth at bedtime., Disp: , Rfl:    ASPIRIN LOW DOSE 81 MG tablet, TAKE 1 TABLET (81 MG TOTAL) BY MOUTH DAILY. SWALLOW WHOLE., Disp: 90 tablet, Rfl: 4   Cholecalciferol (VITAMIN D3) 25 MCG (1000 UT) CAPS, Take 1,000 Units by mouth daily., Disp: , Rfl:    citalopram (CELEXA) 20 MG tablet, Take 20 mg by mouth daily., Disp: , Rfl:    Cyanocobalamin (VITAMIN B 12 PO), Take 1 tablet by mouth daily in the afternoon., Disp: , Rfl:    dorzolamide (TRUSOPT) 2 % ophthalmic solution, Place 1 drop into both eyes daily., Disp: , Rfl:    ezetimibe (ZETIA) 10 MG tablet, Take 1 tablet (10 mg total) by mouth daily., Disp: 90 tablet, Rfl: 3   latanoprost (XALATAN) 0.005 % ophthalmic solution, Place 1 drop into both eyes at bedtime., Disp: , Rfl:    meloxicam (MOBIC) 15 MG tablet, Take 15 mg by mouth daily., Disp: , Rfl:    Multiple Vitamin (MULTIVITAMIN) tablet, Take 1 tablet by mouth daily., Disp: , Rfl:    Multiple Vitamins-Minerals (PRESERVISION AREDS 2) CAPS, Take 1 capsule by mouth daily., Disp: , Rfl:    niacin 500 MG tablet, Take 1,000 mg by mouth daily., Disp: , Rfl:    Probiotic Product (PROBIOTIC-10 PO), Take 1 capsule by mouth daily., Disp: , Rfl:    rosuvastatin (CRESTOR) 20 MG tablet, Take 20 mg by mouth daily., Disp: , Rfl:    timolol (TIMOPTIC) 0.5 % ophthalmic solution, Place 1 drop into both eyes daily., Disp: , Rfl:    Turmeric 500 MG CAPS, Take 500 mg by mouth daily., Disp: , Rfl:   Orders Placed This Encounter  Procedures   Lipid Panel With LDL/HDL Ratio   LDL cholesterol, direct   [id number]   EKG  12-Lead     There are no Patient Instructions on file for this visit.   --Continue cardiac medications as reconciled in final medication list. --Return in about 1 year (around 09/23/2023) for Annual follow up visit, Coronary artery calcification. or sooner if needed. --Continue follow-up with your primary care physician regarding the management of your other chronic comorbid conditions.  Patient's questions and concerns were addressed to her satisfaction. She voices understanding of the instructions provided during this encounter.   This note was created using a voice recognition software as a result there may be grammatical errors inadvertently enclosed that do not reflect the  nature of this encounter. Every attempt is made to correct such errors.  Tessa Lerner, [place], [facility]  Pager:  [phone] Office: [phone]

## 2022-10-31 ENCOUNTER — Other Ambulatory Visit: Payer: Self-pay | Admitting: Cardiology

## 2022-10-31 DIAGNOSIS — I2510 Atherosclerotic heart disease of native coronary artery without angina pectoris: Secondary | ICD-10-CM | POA: Diagnosis not present

## 2022-10-31 DIAGNOSIS — E782 Mixed hyperlipidemia: Secondary | ICD-10-CM | POA: Diagnosis not present

## 2022-10-31 DIAGNOSIS — I2584 Coronary atherosclerosis due to calcified coronary lesion: Secondary | ICD-10-CM | POA: Diagnosis not present

## 2022-10-31 DIAGNOSIS — I700 Atherosclerosis of aorta: Secondary | ICD-10-CM | POA: Diagnosis not present

## 2022-11-07 NOTE — Progress Notes (Signed)
Called patient no answer left a vm

## 2022-11-07 NOTE — Progress Notes (Signed)
Pt understands and was reminded her appointment

## 2022-12-07 DIAGNOSIS — D1801 Hemangioma of skin and subcutaneous tissue: Secondary | ICD-10-CM | POA: Diagnosis not present

## 2022-12-07 DIAGNOSIS — L570 Actinic keratosis: Secondary | ICD-10-CM | POA: Diagnosis not present

## 2022-12-07 DIAGNOSIS — L820 Inflamed seborrheic keratosis: Secondary | ICD-10-CM | POA: Diagnosis not present

## 2022-12-07 DIAGNOSIS — L814 Other melanin hyperpigmentation: Secondary | ICD-10-CM | POA: Diagnosis not present

## 2022-12-07 DIAGNOSIS — L821 Other seborrheic keratosis: Secondary | ICD-10-CM | POA: Diagnosis not present

## 2022-12-28 DIAGNOSIS — H401132 Primary open-angle glaucoma, bilateral, moderate stage: Secondary | ICD-10-CM | POA: Diagnosis not present

## 2023-01-05 ENCOUNTER — Other Ambulatory Visit: Payer: Self-pay | Admitting: Thoracic Surgery (Cardiothoracic Vascular Surgery)

## 2023-01-05 DIAGNOSIS — R911 Solitary pulmonary nodule: Secondary | ICD-10-CM

## 2023-01-11 DIAGNOSIS — Z6834 Body mass index (BMI) 34.0-34.9, adult: Secondary | ICD-10-CM | POA: Diagnosis not present

## 2023-01-11 DIAGNOSIS — Z23 Encounter for immunization: Secondary | ICD-10-CM | POA: Diagnosis not present

## 2023-01-11 DIAGNOSIS — Z79899 Other long term (current) drug therapy: Secondary | ICD-10-CM | POA: Diagnosis not present

## 2023-01-11 DIAGNOSIS — F411 Generalized anxiety disorder: Secondary | ICD-10-CM | POA: Diagnosis not present

## 2023-02-06 ENCOUNTER — Ambulatory Visit
Admission: RE | Admit: 2023-02-06 | Discharge: 2023-02-06 | Disposition: A | Discharge status: Home / Self Care | Payer: No Typology Code available for payment source | Source: Ambulatory Visit | Attending: Thoracic Surgery (Cardiothoracic Vascular Surgery) | Admitting: Thoracic Surgery (Cardiothoracic Vascular Surgery)

## 2023-02-06 DIAGNOSIS — Z85118 Personal history of other malignant neoplasm of bronchus and lung: Secondary | ICD-10-CM | POA: Diagnosis not present

## 2023-02-06 DIAGNOSIS — R911 Solitary pulmonary nodule: Secondary | ICD-10-CM

## 2023-02-06 DIAGNOSIS — Z902 Acquired absence of lung [part of]: Secondary | ICD-10-CM | POA: Diagnosis not present

## 2023-02-14 ENCOUNTER — Ambulatory Visit (INDEPENDENT_AMBULATORY_CARE_PROVIDER_SITE_OTHER): Payer: No Typology Code available for payment source | Admitting: Thoracic Surgery (Cardiothoracic Vascular Surgery)

## 2023-02-14 ENCOUNTER — Encounter: Payer: Self-pay | Admitting: Thoracic Surgery (Cardiothoracic Vascular Surgery)

## 2023-02-14 VITALS — BP 139/68 | HR 77 | Resp 20 | Ht 62.0 in | Wt 187.0 lb

## 2023-02-14 DIAGNOSIS — Z902 Acquired absence of lung [part of]: Secondary | ICD-10-CM

## 2023-02-14 DIAGNOSIS — D3A090 Benign carcinoid tumor of the bronchus and lung: Secondary | ICD-10-CM

## 2023-02-14 DIAGNOSIS — R911 Solitary pulmonary nodule: Secondary | ICD-10-CM

## 2023-02-14 HISTORY — DX: Benign carcinoid tumor of the bronchus and lung: D3A.090

## 2023-02-14 NOTE — Progress Notes (Signed)
[address]       [place] [address]             [phone]    HPI: Ms. 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  Priscilla Taylor is a 77 year old woman who had a lung nodule found on a CT for coronary calcium screening.  She is a lifelong non-smoker.  She underwent robotic assisted left upper lobectomy on 01/12/2022.  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  I last saw her in May.  She was doing well at that time.  She had a CT which showed scarring versus atelectasis at the left lung base.  She was very concerned about that so we scheduled her to return now with a repeat CT scan.  Overall she is feeling well.  No respiratory issues.  She does have sensation of belt tightening around her left lower rib cage and sometimes feels some bulging in the left upper abdomen.  Not having any pain per se.  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     ASPIRIN LOW DOSE 81 MG tablet TAKE 1 TABLET (81 MG TOTAL) BY MOUTH DAILY. SWALLOW WHOLE. 90 tablet 4   Cholecalciferol (VITAMIN D3) 25 MCG (1000 UT) CAPS Take 1,000 Units by mouth daily.     citalopram (CELEXA) 20 MG tablet Take 20 mg by mouth daily.     Cyanocobalamin (VITAMIN B 12 PO) Take 1 tablet by mouth daily in the afternoon.     dorzolamide (TRUSOPT) 2 % ophthalmic solution Place  1 drop into both eyes daily.     latanoprost (XALATAN) 0.005 % ophthalmic solution Place 1 drop into both eyes at bedtime.     meloxicam (MOBIC) 15 MG tablet Take 15 mg by mouth daily.     Multiple Vitamin (MULTIVITAMIN) tablet Take 1 tablet by mouth daily.     Multiple Vitamins-Minerals (PRESERVISION AREDS 2) CAPS Take 1 capsule by mouth daily.     niacin 500 MG tablet Take 1,000 mg by mouth daily.     Probiotic Product (PROBIOTIC-10 PO) Take 1 capsule by mouth daily.     rosuvastatin (CRESTOR) 20 MG tablet Take 20 mg by mouth daily.     timolol (TIMOPTIC) 0.5 % ophthalmic solution Place 1 drop into both eyes daily.     Turmeric 500 MG CAPS Take 500 mg by mouth daily.     ezetimibe (ZETIA) 10 MG tablet Take 1 tablet (10 mg total) by mouth daily. 90 tablet 3   No current facility-administered medications for this visit.    Physical Exam BP 139/68 (BP Location: Left Arm, Patient Position: Sitting)   Pulse 77   Resp 20   Ht 5'  2" (1.575 m)   Wt 187 lb (84.8 kg)   SpO2 94% Comment: RA  BMI 34.17 kg/m  77 year old woman in no acute stress Alert and oriented x 3 with no focal deficits Lungs slightly diminished at left base otherwise clear Incisions well-healed Cardiac regular rate and rhythm  Diagnostic Tests: CT CHEST WITHOUT CONTRAST   TECHNIQUE: Multidetector CT imaging of the chest was performed following the standard protocol without IV contrast.   RADIATION DOSE REDUCTION: This exam was performed according to the departmental dose-optimization program which includes automated exposure control, adjustment of the mA and/or kV according to patient size and/or use of iterative reconstruction technique.   COMPARISON:  Chest CT 07/29/2022 and 09/23/2021.  PET-CT 10/20/2021.   FINDINGS: Cardiovascular: Mild atherosclerosis of the aorta, great vessels and coronary arteries. The heart size is normal. There is no pericardial effusion.   Mediastinum/Nodes: There are no enlarged  mediastinal, hilar or axillary lymph nodes.Hilar assessment is limited by the lack of intravenous contrast, although the hilar contours appear unchanged. The thyroid gland, trachea and esophagus demonstrate no significant findings.   Lungs/Pleura: No pleural or pericardial effusion status post left upper lobectomy. Unchanged linear opacities at both lung bases most consistent with scarring or chronic atelectasis. No new, enlarging or suspicious pulmonary nodules.   Upper abdomen: The visualized upper abdomen appears stable, without significant findings.   Musculoskeletal/Chest wall: There is no chest wall mass or suspicious osseous finding. Mild spondylosis.   IMPRESSION: 1. Stable chest CT status post left upper lobectomy. No evidence of local recurrence or metastatic disease. 2. Stable linear opacities at both lung bases most consistent with scarring or chronic atelectasis. 3.  Aortic Atherosclerosis (ICD10-I70.0).     Electronically Signed   By: Carey Bullocks M.D.   On: 02/06/2023 15:39   I personally reviewed the CT images.  No evidence of recurrence.  Stable scarring lung bases left greater than right.  Impression: Priscilla Taylor is a 77 year old non-smoker who had a robotic assisted left upper lobectomy for a stage Ia low-grade neuroendocrine tumor about a year ago.  She is doing well at this time with no evidence of recurrent disease.  CT in May of this year showed some round atelectasis versus scarring at the left lung base.  She was very concerned about that so we repeated the CT.  I have anything that area smaller.  Nothing to suggest malignancy.  She still has some unusual sensations related to the surgery.  I reassured her that that is very common and not a sign of any pathologic process. Clinical clinical including Plan: Follow-up with Dr. Arbutus Ped I will be happy to see [name] back at anytime in the future if I can be of any further assistance with her  care  I spent 20 minutes in review of records, images, and in consultation with Ms. Feher today. Loreli Slot, MD [facility] [phone]

## 2023-05-26 ENCOUNTER — Telehealth: Payer: Self-pay | Admitting: Internal Medicine

## 2023-05-26 NOTE — Telephone Encounter (Signed)
 Rescheduled appointment per provider on call. Patient is aware of the changes made.

## 2023-06-01 DIAGNOSIS — J019 Acute sinusitis, unspecified: Secondary | ICD-10-CM | POA: Diagnosis not present

## 2023-06-01 DIAGNOSIS — H6693 Otitis media, unspecified, bilateral: Secondary | ICD-10-CM | POA: Diagnosis not present

## 2023-06-01 DIAGNOSIS — H10023 Other mucopurulent conjunctivitis, bilateral: Secondary | ICD-10-CM | POA: Diagnosis not present

## 2023-06-05 DIAGNOSIS — Z1231 Encounter for screening mammogram for malignant neoplasm of breast: Secondary | ICD-10-CM | POA: Diagnosis not present

## 2023-06-20 DIAGNOSIS — H6993 Unspecified Eustachian tube disorder, bilateral: Secondary | ICD-10-CM | POA: Diagnosis not present

## 2023-06-20 DIAGNOSIS — H6503 Acute serous otitis media, bilateral: Secondary | ICD-10-CM | POA: Diagnosis not present

## 2023-06-28 DIAGNOSIS — D3131 Benign neoplasm of right choroid: Secondary | ICD-10-CM | POA: Diagnosis not present

## 2023-06-28 DIAGNOSIS — H401132 Primary open-angle glaucoma, bilateral, moderate stage: Secondary | ICD-10-CM | POA: Diagnosis not present

## 2023-06-28 DIAGNOSIS — H524 Presbyopia: Secondary | ICD-10-CM | POA: Diagnosis not present

## 2023-06-28 DIAGNOSIS — H52223 Regular astigmatism, bilateral: Secondary | ICD-10-CM | POA: Diagnosis not present

## 2023-07-10 DIAGNOSIS — E782 Mixed hyperlipidemia: Secondary | ICD-10-CM | POA: Diagnosis not present

## 2023-07-10 DIAGNOSIS — Z79899 Other long term (current) drug therapy: Secondary | ICD-10-CM | POA: Diagnosis not present

## 2023-07-10 DIAGNOSIS — R7303 Prediabetes: Secondary | ICD-10-CM | POA: Diagnosis not present

## 2023-07-10 DIAGNOSIS — H6503 Acute serous otitis media, bilateral: Secondary | ICD-10-CM | POA: Diagnosis not present

## 2023-07-10 DIAGNOSIS — H6993 Unspecified Eustachian tube disorder, bilateral: Secondary | ICD-10-CM | POA: Diagnosis not present

## 2023-07-10 DIAGNOSIS — H903 Sensorineural hearing loss, bilateral: Secondary | ICD-10-CM | POA: Diagnosis not present

## 2023-07-19 DIAGNOSIS — Z0000 Encounter for general adult medical examination without abnormal findings: Secondary | ICD-10-CM | POA: Diagnosis not present

## 2023-07-19 DIAGNOSIS — Z6833 Body mass index (BMI) 33.0-33.9, adult: Secondary | ICD-10-CM | POA: Diagnosis not present

## 2023-07-19 DIAGNOSIS — R351 Nocturia: Secondary | ICD-10-CM | POA: Diagnosis not present

## 2023-07-19 DIAGNOSIS — F411 Generalized anxiety disorder: Secondary | ICD-10-CM | POA: Diagnosis not present

## 2023-07-19 DIAGNOSIS — E782 Mixed hyperlipidemia: Secondary | ICD-10-CM | POA: Diagnosis not present

## 2023-07-19 DIAGNOSIS — Z79899 Other long term (current) drug therapy: Secondary | ICD-10-CM | POA: Diagnosis not present

## 2023-07-19 DIAGNOSIS — L309 Dermatitis, unspecified: Secondary | ICD-10-CM | POA: Diagnosis not present

## 2023-07-19 DIAGNOSIS — R7303 Prediabetes: Secondary | ICD-10-CM | POA: Diagnosis not present

## 2023-07-19 DIAGNOSIS — M8588 Other specified disorders of bone density and structure, other site: Secondary | ICD-10-CM | POA: Diagnosis not present

## 2023-07-19 DIAGNOSIS — Z23 Encounter for immunization: Secondary | ICD-10-CM | POA: Diagnosis not present

## 2023-07-19 DIAGNOSIS — M459 Ankylosing spondylitis of unspecified sites in spine: Secondary | ICD-10-CM | POA: Diagnosis not present

## 2023-07-31 ENCOUNTER — Ambulatory Visit (HOSPITAL_COMMUNITY)
Admission: RE | Admit: 2023-07-31 | Discharge: 2023-07-31 | Disposition: A | Discharge status: Home / Self Care | Source: Ambulatory Visit | Attending: Internal Medicine | Admitting: Internal Medicine

## 2023-07-31 ENCOUNTER — Inpatient Hospital Stay: Payer: No Typology Code available for payment source | Attending: Internal Medicine

## 2023-07-31 DIAGNOSIS — C7A090 Malignant carcinoid tumor of the bronchus and lung: Secondary | ICD-10-CM | POA: Insufficient documentation

## 2023-07-31 DIAGNOSIS — M129 Arthropathy, unspecified: Secondary | ICD-10-CM | POA: Diagnosis not present

## 2023-07-31 DIAGNOSIS — R053 Chronic cough: Secondary | ICD-10-CM | POA: Diagnosis not present

## 2023-07-31 DIAGNOSIS — R062 Wheezing: Secondary | ICD-10-CM | POA: Diagnosis not present

## 2023-07-31 DIAGNOSIS — I2510 Atherosclerotic heart disease of native coronary artery without angina pectoris: Secondary | ICD-10-CM | POA: Insufficient documentation

## 2023-07-31 DIAGNOSIS — E785 Hyperlipidemia, unspecified: Secondary | ICD-10-CM | POA: Diagnosis not present

## 2023-07-31 DIAGNOSIS — K219 Gastro-esophageal reflux disease without esophagitis: Secondary | ICD-10-CM | POA: Insufficient documentation

## 2023-07-31 DIAGNOSIS — C7A00 Malignant carcinoid tumor of unspecified site: Secondary | ICD-10-CM | POA: Diagnosis not present

## 2023-07-31 DIAGNOSIS — Z79899 Other long term (current) drug therapy: Secondary | ICD-10-CM | POA: Insufficient documentation

## 2023-07-31 DIAGNOSIS — Z791 Long term (current) use of non-steroidal anti-inflammatories (NSAID): Secondary | ICD-10-CM | POA: Diagnosis not present

## 2023-07-31 DIAGNOSIS — I700 Atherosclerosis of aorta: Secondary | ICD-10-CM | POA: Diagnosis not present

## 2023-07-31 DIAGNOSIS — K760 Fatty (change of) liver, not elsewhere classified: Secondary | ICD-10-CM | POA: Insufficient documentation

## 2023-07-31 DIAGNOSIS — Z87442 Personal history of urinary calculi: Secondary | ICD-10-CM | POA: Insufficient documentation

## 2023-07-31 DIAGNOSIS — C7A8 Other malignant neuroendocrine tumors: Secondary | ICD-10-CM | POA: Diagnosis not present

## 2023-07-31 DIAGNOSIS — D649 Anemia, unspecified: Secondary | ICD-10-CM | POA: Diagnosis not present

## 2023-07-31 DIAGNOSIS — Z7982 Long term (current) use of aspirin: Secondary | ICD-10-CM | POA: Insufficient documentation

## 2023-07-31 LAB — CMP (CANCER CENTER ONLY)
ALT: 91 U/L — ABNORMAL HIGH (ref 0–44)
AST: 78 U/L — ABNORMAL HIGH (ref 15–41)
Albumin: 3.6 g/dL (ref 3.5–5.0)
Alkaline Phosphatase: 172 U/L — ABNORMAL HIGH (ref 38–126)
Anion gap: 4 — ABNORMAL LOW (ref 5–15)
BUN: 14 mg/dL (ref 8–23)
CO2: 29 mmol/L (ref 22–32)
Calcium: 8.7 mg/dL — ABNORMAL LOW (ref 8.9–10.3)
Chloride: 103 mmol/L (ref 98–111)
Creatinine: 0.69 mg/dL (ref 0.44–1.00)
GFR, Estimated: >60 mL/min (ref >60)
Glucose, Bld: 159 mg/dL — ABNORMAL HIGH (ref 70–99)
Potassium: 3.4 mmol/L — ABNORMAL LOW (ref 3.5–5.1)
Sodium: 136 mmol/L (ref 135–145)
Total Bilirubin: 0.7 mg/dL (ref 0.0–1.2)
Total Protein: 6.8 g/dL (ref 6.5–8.1)

## 2023-07-31 LAB — CBC WITH DIFFERENTIAL (CANCER CENTER ONLY)
Abs Immature Granulocytes: 0.06 10*3/uL (ref 0.00–0.07)
Basophils Absolute: 0.1 10*3/uL (ref 0.0–0.1)
Basophils Relative: 1 %
Eosinophils Absolute: 0 10*3/uL (ref 0.0–0.5)
Eosinophils Relative: 1 %
HCT: 33.8 % — ABNORMAL LOW (ref 36.0–46.0)
Hemoglobin: 11.3 g/dL — ABNORMAL LOW (ref 12.0–15.0)
Immature Granulocytes: 1 %
Lymphocytes Relative: 41 %
Lymphs Abs: 2.7 10*3/uL (ref 0.7–4.0)
MCH: 28.8 pg (ref 26.0–34.0)
MCHC: 33.4 g/dL (ref 30.0–36.0)
MCV: 86.2 fL (ref 80.0–100.0)
Monocytes Absolute: 0.5 10*3/uL (ref 0.1–1.0)
Monocytes Relative: 8 %
Neutro Abs: 3.2 10*3/uL (ref 1.7–7.7)
Neutrophils Relative %: 48 %
Platelet Count: 193 10*3/uL (ref 150–400)
RBC: 3.92 MIL/uL (ref 3.87–5.11)
RDW: 14.4 % (ref 11.5–15.5)
Smear Review: NORMAL
WBC Count: 6.5 10*3/uL (ref 4.0–10.5)
nRBC: 0 % (ref 0.0–0.2)

## 2023-07-31 MED ORDER — IOHEXOL 300 MG/ML  SOLN
75.0000 mL | Freq: Once | INTRAMUSCULAR | Status: AC | PRN
  Administered 2023-07-31: 75 mL via INTRAVENOUS

## 2023-07-31 MED ORDER — SODIUM CHLORIDE (PF) 0.9 % IJ SOLN
INTRAMUSCULAR | Status: AC
Start: 2023-07-31 — End: ?
  Filled 2023-07-31: qty 50

## 2023-08-02 ENCOUNTER — Ambulatory Visit: Payer: No Typology Code available for payment source | Admitting: Internal Medicine

## 2023-08-03 ENCOUNTER — Inpatient Hospital Stay: Payer: No Typology Code available for payment source | Admitting: Internal Medicine

## 2023-08-03 VITALS — BP 148/59 | HR 94 | Temp 98.4°F | Resp 18 | Wt 193.0 lb

## 2023-08-03 DIAGNOSIS — C7A090 Malignant carcinoid tumor of the bronchus and lung: Secondary | ICD-10-CM | POA: Diagnosis not present

## 2023-08-03 DIAGNOSIS — C3490 Malignant neoplasm of unspecified part of unspecified bronchus or lung: Secondary | ICD-10-CM | POA: Diagnosis not present

## 2023-08-03 NOTE — Progress Notes (Signed)
 [facility] Telephone:([phone]) [phone]   Fax:(336) [phone]  OFFICE PROGRESS NOTE  [name], MD [address] [place] [address]  DIAGNOSIS: Stage IA (T1c, N0, M0)  well-differentiated neuroendocrine, typical carcinoid tumor diagnosed in October 2023  PRIOR THERAPY: Status post left upper lobectomy with lymph node dissection under the care of Dr. Luna Salinas on January 12, 2022.  CURRENT THERAPY: Observation.  INTERVAL HISTORY: Priscilla Taylor 78 y.o. female returns to the clinic today for annual follow-up visit.Discussed the use of AI scribe software for clinical note transcription with the patient, who gave verbal consent to proceed.  History of Present Illness   Priscilla Taylor is a 78 year old female with stage 1A well-differentiated neuroendocrine carcinoma who presents for evaluation with repeat CT scan of the chest.  She has experienced a persistent cough with clear sputum, nasal drip, and occasional wheezing, worsening over the past three months. No greenish or yellowish sputum is present.  Recently, she felt unwell for four to five days, with a significant decrease in appetite and fluid intake. During this period, blood work revealed elevated liver enzymes and low hemoglobin, which she attributes to her reduced intake.  She is currently on cholesterol medication and had normal blood work three to four weeks ago. She took one 500 mg Tylenol  recently but does not regularly use over-the-counter medications.       MEDICAL HISTORY: Past Medical History:  Diagnosis Date   Arthritis    Carcinoid tumor of left lung 02/14/2023   Stage Ia low-grade neuroendocrine tumor resected 2023     Coronary atherosclerosis due to calcified coronary lesion    Eczema    Family history of adverse reaction to anesthesia    post op N&V   Fatty liver    Frequency of urination    at bedtime   GERD (gastroesophageal reflux disease)    History of kidney  stones    Hyperlipidemia    Pneumonia    hx of years ago   Reflux     ALLERGIES:  is allergic to sulfa antibiotics, amoxicillin, lipitor [atorvastatin ], and zocor [simvastatin].  MEDICATIONS:  Current Outpatient Medications  Medication Sig Dispense Refill   ALPRAZolam  (XANAX ) 0.25 MG tablet Take 0.125 mg by mouth at bedtime.     ASPIRIN  LOW DOSE 81 MG tablet TAKE 1 TABLET (81 MG TOTAL) BY MOUTH DAILY. SWALLOW WHOLE. 90 tablet 4   Cholecalciferol (VITAMIN D3) 25 MCG (1000 UT) CAPS Take 1,000 Units by mouth daily.     citalopram  (CELEXA ) 20 MG tablet Take 20 mg by mouth daily.     Cyanocobalamin (VITAMIN B 12 PO) Take 1 tablet by mouth daily in the afternoon.     dorzolamide  (TRUSOPT ) 2 % ophthalmic solution Place 1 drop into both eyes daily.     ezetimibe  (ZETIA ) 10 MG tablet Take 1 tablet (10 mg total) by mouth daily. 90 tablet 3   latanoprost  (XALATAN ) 0.005 % ophthalmic solution Place 1 drop into both eyes at bedtime.     meloxicam (MOBIC) 15 MG tablet Take 15 mg by mouth daily.     Multiple Vitamin (MULTIVITAMIN) tablet Take 1 tablet by mouth daily.     Multiple Vitamins-Minerals (PRESERVISION AREDS 2) CAPS Take 1 capsule by mouth daily.     niacin  500 MG tablet Take 1,000 mg by mouth daily.     Probiotic Product (PROBIOTIC-10 PO) Take 1 capsule by mouth daily.     rosuvastatin  (  CRESTOR ) 20 MG tablet Take 20 mg by mouth daily.     timolol  (TIMOPTIC ) 0.5 % ophthalmic solution Place 1 drop into both eyes daily.     Turmeric 500 MG CAPS Take 500 mg by mouth daily.     No current facility-administered medications for this visit.    SURGICAL HISTORY:  Past Surgical History:  Procedure Laterality Date   APPENDECTOMY     BREAST BIOPSY Left 03/28/1984   CATARACT EXTRACTION Bilateral    CESAREAN SECTION     INTERCOSTAL NERVE BLOCK Left 01/12/2022   Procedure: INTERCOSTAL NERVE BLOCK;  Surgeon: Zelphia Higashi, MD;  Location: [facility] OR;  Service: Thoracic;  Laterality: Left;    JOINT REPLACEMENT Left 03/28/2002   knee   LYMPH NODE DISSECTION Left 01/12/2022   Procedure: LYMPH NODE DISSECTION;  Surgeon: Zelphia Higashi, MD;  Location: [facility] OR;  Service: Thoracic;  Laterality: Left;   TOTAL KNEE ARTHROPLASTY Right 06/11/2012   Procedure: TOTAL KNEE ARTHROPLASTY;  Surgeon: Christie Cox, MD;  Location: [facility] OR;  Service: Orthopedics;  Laterality: Right;   TOTAL KNEE ARTHROPLASTY Right 06/11/2012   Dr Genevive Ket    REVIEW OF SYSTEMS:  Constitutional: positive for fatigue Eyes: negative Ears, nose, mouth, throat, and face: negative Respiratory: positive for cough Cardiovascular: negative Gastrointestinal: negative Genitourinary:negative Integument/breast: negative Hematologic/lymphatic: negative Musculoskeletal:positive for arthralgias Neurological: negative Behavioral/Psych: negative Endocrine: negative Allergic/Immunologic: negative   PHYSICAL EXAMINATION: General appearance: alert, cooperative, fatigued, and no distress Head: Normocephalic, without obvious abnormality, atraumatic Neck: no adenopathy, no JVD, supple, symmetrical, trachea midline, and thyroid  not enlarged, symmetric, no tenderness/mass/nodules Lymph nodes: Cervical, supraclavicular, and axillary nodes normal. Resp: clear to auscultation bilaterally Back: symmetric, no curvature. ROM normal. No CVA tenderness. Cardio: regular rate and rhythm, S1, S2 normal, no murmur, click, rub or gallop GI: soft, non-tender; bowel sounds normal; no masses,  no organomegaly Extremities: extremities normal, atraumatic, no cyanosis or edema Neurologic: Alert and oriented X 3, normal strength and tone. Normal symmetric reflexes. Normal coordination and gait  ECOG PERFORMANCE STATUS: 1 - Symptomatic but completely ambulatory  Blood pressure (!) 148/59, pulse 94, temperature 98.4 F (36.9 C), temperature source Temporal, resp. rate 18, weight 193 lb (87.5 kg), SpO2 96%.  LABORATORY DATA: Lab Results  Component  Value Date   WBC 6.5 07/31/2023   HGB 11.3 (L) 07/31/2023   HCT 33.8 (L) 07/31/2023   MCV 86.2 07/31/2023   PLT 193 07/31/2023      Chemistry      Component Value Date/Time   NA 136 07/31/2023 1159   NA 142 10/31/2022 0827   K 3.4 (L) 07/31/2023 1159   CL 103 07/31/2023 1159   CO2 29 07/31/2023 1159   BUN 14 07/31/2023 1159   BUN 17 10/31/2022 0827   CREATININE 0.69 07/31/2023 1159      Component Value Date/Time   CALCIUM  8.7 (L) 07/31/2023 1159   ALKPHOS 172 (H) 07/31/2023 1159   AST 78 (H) 07/31/2023 1159   ALT 91 (H) 07/31/2023 1159   BILITOT 0.7 07/31/2023 1159       RADIOGRAPHIC STUDIES: CT Chest W Contrast Result Date: 08/03/2023 CLINICAL DATA:  Neuroendocrine tumor. Malignant carcinoid tumor of the bronchus. * Tracking Code: BO * EXAM: CT CHEST WITH CONTRAST TECHNIQUE: Multidetector CT imaging of the chest was performed during intravenous contrast administration. RADIATION DOSE REDUCTION: This exam was performed according to the departmental dose-optimization program which includes automated exposure control, adjustment of the mA and/or kV according to patient size and/or  use of iterative reconstruction technique. CONTRAST:  75mL OMNIPAQUE  IOHEXOL  300 MG/ML  SOLN COMPARISON:  Chest CT 02/06/2023 FINDINGS: Cardiovascular: Coronary artery calcification and aortic atherosclerotic calcification. Mediastinum/Nodes: No axillary or supraclavicular adenopathy. No mediastinal or hilar adenopathy. No pericardial fluid. Esophagus normal. Lungs/Pleura: Postsurgical change consistent with LEFT upper lobectomy. No suspicious nodularity or growth in the LEFT lung. No pleural abnormality. RIGHT lung is clear. Upper Abdomen: Limited view of the liver, kidneys, pancreas are unremarkable. Normal adrenal glands. Musculoskeletal: No aggressive osseous lesion. IMPRESSION: 1. No evidence of recurrent neuroendocrine tumor in the chest. 2. Postsurgical change consistent with LEFT upper lobectomy. 3.   Aortic Atherosclerosis (ICD10-I70.0). Electronically Signed   By: Deboraha Fallow M.D.   On: 08/03/2023 12:45    ASSESSMENT AND PLAN: This is a very pleasant 78 years old white female with Stage IA (T1c, N0, M0) non-small cell lung cancer, well-differentiated neuroendocrine, typical carcinoid tumor diagnosed in October 2023. She is status post left upper lobectomy with lymph node dissection under the care of Dr. Luna Salinas. She is currently on observation.  She had repeat CT scan of the chest performed recently.  I personally and independently reviewed with the patient today her scan showed no evidence for recurrent neuroendocrine in the chest.    Neuroendocrine carcinoma of the lung Stage 1A well-differentiated neuroendocrine carcinoma, typical carcinoid, status post left upper lobectomy with lymph node dissection. Currently under observation with no evidence of recurrent tumor in the chest as per recent CT scan. Persistent cough likely due to postnasal drip, exacerbated by springtime allergens. - Continue observation. - Schedule follow-up CT scan of the chest in one year.  Anemia Hemoglobin level at 11.3, indicating mild anemia. History of fluctuating hemoglobin levels, previously as low as 10.6. Recent lack of appetite and poor oral intake may have contributed to the current anemia.  Elevated liver enzymes Recent blood work shows elevated liver enzymes (AST, ALT, and alkaline phosphatase), almost double the normal range. Possible association with cholesterol medication (Zetia ). - Discuss elevated liver enzymes with primary care physician. - Consider holding Zetia  until liver enzymes recover. - Repeat liver function tests in one to two weeks.  Aortic atherosclerosis Aortic atherosclerosis noted on imaging, consistent with age-related changes.   The patient was advised to call immediately if she has any concerning symptoms in the interval.  The patient voices understanding of current  disease status and treatment options and is in agreement with the current care plan.  All questions were answered. The patient knows to call the clinic with any problems, questions or concerns. We can certainly see the patient much sooner if necessary.  The total time spent in the appointment was 30 minutes including review of chart and various tests results, discussions about plan of care and coordination of care plan .   Disclaimer: This note was dictated with voice recognition software. Similar sounding words can inadvertently be transcribed and may not be corrected upon review.

## 2023-08-07 DIAGNOSIS — E782 Mixed hyperlipidemia: Secondary | ICD-10-CM | POA: Diagnosis not present

## 2023-08-07 DIAGNOSIS — Z6834 Body mass index (BMI) 34.0-34.9, adult: Secondary | ICD-10-CM | POA: Diagnosis not present

## 2023-08-07 DIAGNOSIS — R5383 Other fatigue: Secondary | ICD-10-CM | POA: Diagnosis not present

## 2023-08-07 DIAGNOSIS — R059 Cough, unspecified: Secondary | ICD-10-CM | POA: Diagnosis not present

## 2023-08-08 DIAGNOSIS — R350 Frequency of micturition: Secondary | ICD-10-CM | POA: Diagnosis not present

## 2023-08-09 DIAGNOSIS — H401132 Primary open-angle glaucoma, bilateral, moderate stage: Secondary | ICD-10-CM | POA: Diagnosis not present

## 2023-08-10 DIAGNOSIS — D72820 Lymphocytosis (symptomatic): Secondary | ICD-10-CM | POA: Diagnosis not present

## 2023-08-11 DIAGNOSIS — R351 Nocturia: Secondary | ICD-10-CM | POA: Diagnosis not present

## 2023-08-11 DIAGNOSIS — D649 Anemia, unspecified: Secondary | ICD-10-CM | POA: Diagnosis not present

## 2023-08-11 DIAGNOSIS — R5381 Other malaise: Secondary | ICD-10-CM | POA: Diagnosis not present

## 2023-08-26 DIAGNOSIS — E669 Obesity, unspecified: Secondary | ICD-10-CM | POA: Diagnosis not present

## 2023-08-26 DIAGNOSIS — E782 Mixed hyperlipidemia: Secondary | ICD-10-CM | POA: Diagnosis not present

## 2023-08-26 DIAGNOSIS — M459 Ankylosing spondylitis of unspecified sites in spine: Secondary | ICD-10-CM | POA: Diagnosis not present

## 2023-08-26 DIAGNOSIS — F411 Generalized anxiety disorder: Secondary | ICD-10-CM | POA: Diagnosis not present

## 2023-09-01 ENCOUNTER — Other Ambulatory Visit: Payer: Self-pay | Admitting: Cardiology

## 2023-09-01 DIAGNOSIS — I700 Atherosclerosis of aorta: Secondary | ICD-10-CM

## 2023-09-01 DIAGNOSIS — E782 Mixed hyperlipidemia: Secondary | ICD-10-CM

## 2023-09-01 DIAGNOSIS — I2510 Atherosclerotic heart disease of native coronary artery without angina pectoris: Secondary | ICD-10-CM

## 2023-09-01 DIAGNOSIS — R931 Abnormal findings on diagnostic imaging of heart and coronary circulation: Secondary | ICD-10-CM

## 2023-09-08 DIAGNOSIS — H6993 Unspecified Eustachian tube disorder, bilateral: Secondary | ICD-10-CM | POA: Diagnosis not present

## 2023-09-20 DIAGNOSIS — R5381 Other malaise: Secondary | ICD-10-CM | POA: Diagnosis not present

## 2023-09-20 DIAGNOSIS — R748 Abnormal levels of other serum enzymes: Secondary | ICD-10-CM | POA: Diagnosis not present

## 2023-09-20 DIAGNOSIS — D649 Anemia, unspecified: Secondary | ICD-10-CM | POA: Diagnosis not present

## 2023-09-25 ENCOUNTER — Ambulatory Visit: Payer: Self-pay | Admitting: Cardiology

## 2023-09-25 DIAGNOSIS — E782 Mixed hyperlipidemia: Secondary | ICD-10-CM | POA: Diagnosis not present

## 2023-09-25 DIAGNOSIS — M459 Ankylosing spondylitis of unspecified sites in spine: Secondary | ICD-10-CM | POA: Diagnosis not present

## 2023-09-25 DIAGNOSIS — F411 Generalized anxiety disorder: Secondary | ICD-10-CM | POA: Diagnosis not present

## 2023-09-25 DIAGNOSIS — E669 Obesity, unspecified: Secondary | ICD-10-CM | POA: Diagnosis not present

## 2023-09-26 DIAGNOSIS — R899 Unspecified abnormal finding in specimens from other organs, systems and tissues: Secondary | ICD-10-CM | POA: Diagnosis not present

## 2023-09-26 DIAGNOSIS — D649 Anemia, unspecified: Secondary | ICD-10-CM | POA: Diagnosis not present

## 2023-09-26 DIAGNOSIS — B349 Viral infection, unspecified: Secondary | ICD-10-CM | POA: Diagnosis not present

## 2023-09-26 DIAGNOSIS — Z09 Encounter for follow-up examination after completed treatment for conditions other than malignant neoplasm: Secondary | ICD-10-CM | POA: Diagnosis not present

## 2023-09-26 DIAGNOSIS — Z6833 Body mass index (BMI) 33.0-33.9, adult: Secondary | ICD-10-CM | POA: Diagnosis not present

## 2023-10-09 ENCOUNTER — Telehealth: Payer: Self-pay | Admitting: Cardiology

## 2023-10-09 NOTE — Telephone Encounter (Signed)
 STAT if HR is under 50 or over 120  (normal HR is 60-100 beats per minute)  What is your heart rate? Not sure currently   Do you have a log of your heart rate readings (document readings)? no  Do you have any other symptoms? Pt called in stating she has been off balance, shaky, and having a headache. Denies CP or SOB. She states she has been under a lot of stress and she is not sure if her symptoms are anxiety related or her heart. She states her hr has been 50's-60s. Please advise.

## 2023-10-09 NOTE — Telephone Encounter (Signed)
 Spoke with pt who states she had a virus March 2025 that lasted for 3-4 months.  Pt states she after that she had labs completed and she has been under a great deal of stress with a move and thinking her cancer had returned.  Pt states she feels as if she is shaking inside and out.  She denies current CP or SOB.  She does not have current BP readings.  She wonders if this is not anxiety related but she is scheduled to see Dr Michele on 7/30 and requests appointment be moved up to rule out any heart issues.  Appointment rescheduled to 10/12/23 with Dr Michele.  Reviewed ED precautions.  Pt verbalizes understanding and agrees with current plan.

## 2023-10-10 ENCOUNTER — Ambulatory Visit: Admitting: Cardiology

## 2023-10-11 DIAGNOSIS — R899 Unspecified abnormal finding in specimens from other organs, systems and tissues: Secondary | ICD-10-CM | POA: Diagnosis not present

## 2023-10-11 DIAGNOSIS — Z09 Encounter for follow-up examination after completed treatment for conditions other than malignant neoplasm: Secondary | ICD-10-CM | POA: Diagnosis not present

## 2023-10-11 DIAGNOSIS — Z6832 Body mass index (BMI) 32.0-32.9, adult: Secondary | ICD-10-CM | POA: Diagnosis not present

## 2023-10-11 DIAGNOSIS — D649 Anemia, unspecified: Secondary | ICD-10-CM | POA: Diagnosis not present

## 2023-10-12 ENCOUNTER — Encounter: Payer: Self-pay | Admitting: Cardiology

## 2023-10-12 ENCOUNTER — Ambulatory Visit: Attending: Cardiology | Admitting: Cardiology

## 2023-10-12 VITALS — BP 140/76 | HR 80 | Resp 16 | Ht 62.0 in | Wt 186.2 lb

## 2023-10-12 DIAGNOSIS — I700 Atherosclerosis of aorta: Secondary | ICD-10-CM | POA: Diagnosis not present

## 2023-10-12 DIAGNOSIS — I2584 Coronary atherosclerosis due to calcified coronary lesion: Secondary | ICD-10-CM | POA: Diagnosis not present

## 2023-10-12 DIAGNOSIS — I2510 Atherosclerotic heart disease of native coronary artery without angina pectoris: Secondary | ICD-10-CM

## 2023-10-12 DIAGNOSIS — E782 Mixed hyperlipidemia: Secondary | ICD-10-CM | POA: Diagnosis not present

## 2023-10-12 NOTE — Patient Instructions (Signed)
 Medication Instructions:  No changes to your medications  *If you need a refill on your cardiac medications before your next appointment, please call your pharmacy*  Testing/Procedures: Your physician has requested that you have an echocardiogram. Echocardiography is a painless test that uses sound waves to create images of your heart. It provides your doctor with information about the size and shape of your heart and how well your heart's chambers and valves are working. This procedure takes approximately one hour. There are no restrictions for this procedure. Please do NOT wear cologne, perfume, aftershave, or lotions (deodorant is allowed). Please arrive 15 minutes prior to your appointment time.  Please note: We ask at that you not bring children with you during ultrasound (echo/ vascular) testing. Due to room size and safety concerns, children are not allowed in the ultrasound rooms during exams. Our front office staff cannot provide observation of children in our lobby area while testing is being conducted. An adult accompanying a patient to their appointment will only be allowed in the ultrasound room at the discretion of the ultrasound technician under special circumstances. We apologize for any inconvenience.   Follow-Up: At [facility], you and your health needs are our priority.  As part of our continuing mission to provide you with exceptional heart care, our providers are all part of one team.  This team includes your primary Cardiologist (physician) and Advanced Practice Providers or APPs (Physician Assistants and Nurse Practitioners) who all work together to provide you with the care you need, when you need it.  Your next appointment:   1 year(s)  Provider:   Madonna Large, DO    We recommend signing up for the patient portal called MyChart.  Sign up information is provided on this After Visit Summary.  MyChart is used to connect with patients for Virtual Visits  (Telemedicine).  Patients are able to view lab/test results, encounter notes, upcoming appointments, etc.  Non-urgent messages can be sent to your provider as well.   To learn more about what you can do with MyChart, go to [email or web address].

## 2023-10-12 NOTE — Progress Notes (Unsigned)
 Cardiology Office Note:  .   Date:  10/12/2023  ID:  Priscilla Taylor, DOB 1945/07/18, MRN [id number] PCP:  Okey Carlin Redbird, MD  Former Cardiology Providers: None  [facility] Providers Cardiologist:  None , [facility] (established care 10/08/2021) Electrophysiologist:  None  Click to update primary MD,subspecialty MD or APP then REFRESH:1}    Chief Complaint  Patient presents with   Coronary atherosclerosis due to calcified coronary lesion   Follow-up    History of Present Illness: .   Priscilla Taylor is a 78 y.o. Caucasian female whose past medical history and cardiovascular risk factors includes: Prediabetes, hyperlipidemia, aortic atherosclerosis, moderate coronary calcification (total CAC 118.5 AU, 66th percentile as of May 2023).   Prior to establishing care she had a coronary calcium  score under the care of her PCP and was noted to have moderate coronary calcification with a total score of 118.5 placing her at the 66 percentile.  She underwent additional testing as outlined below.  And has also undergone lobectomy with Dr. Kerrin in the past.  *** Y walks 2hrs for 2 days   Review of Systems: .   Review of Systems  Cardiovascular:  Negative for chest pain, claudication, irregular heartbeat, leg swelling, near-syncope, orthopnea, palpitations, paroxysmal nocturnal dyspnea and syncope.  Respiratory:  Negative for shortness of breath.   Hematologic/Lymphatic: Negative for bleeding problem.    Studies Reviewed:   EKG: EKG Interpretation Date/Time:  Thursday October 12 2023 13:35:49 EDT Ventricular Rate:  77 PR Interval:  212 QRS Duration:  78 QT Interval:  396 QTC Calculation: 448 R Axis:   59  Text Interpretation: Sinus rhythm with 1st degree A-V block When compared with ECG of 10-Jan-2022 11:55, No significant change was found Confirmed by Michele Richardson [phone]) on 10/12/2023 1:51:24 PM  Echocardiogram: 11/12/2021: Normal LV systolic function with visual EF  60-65%. Left ventricle cavity is normal in size. Mild concentric hypertrophy of the left ventricle. Normal global wall motion. Doppler evidence of grade I (impaired) diastolic dysfunction, normal LAP. Calculated EF 65%. Structurally normal trileaflet aortic valve. Mild (Grade I) aortic regurgitation. Structurally normal tricuspid valve with trace regurgitation. No evidence of pulmonary hypertension.   Stress Testing: Lexiscan  (with Mod Bruce protocol) Nuclear stress test 11/15/21 Myocardial perfusion is normal. See report for details    Heart Catheterization: None   Carotid duplex: 12/05/2014 Mild (1-49%) stenosis in the proximal left ICA secondary to smooth but hypoechoic lipid rich atherosclerotic plaque.  Mild intimal thickening of the right without evidence of ICA stenosis. Vertebral arteries are patent and antegrade flow.   Coronary artery calcium  score: 08/03/2021: Left Main: 0   LAD: 118   LCx: 0.5   RCA: 0   Total Agatston Score: 118.5   MESA database percentile: 66   AORTA MEASUREMENTS:   Ascending Aorta: 27 mm   Descending Aorta: 21 mm   Incidental detection of a mass in the left upper lobe measuring approximately 1.7 x 1.1 x 1.4 cm. This is potentially representative of a primary lung carcinoma. Further evaluation is recommended by a full CT of the chest with IV contrast. Additional multi-disciplinary thoracic oncologic evaluation recommended. The nodule is near a branch point of the anterior left upper lobe bronchus and may be approachable by bronchoscopy.  RADIOLOGY: NA  Risk Assessment/Calculations:   NA   Labs:       Latest Ref Rng & Units 07/31/2023   11:59 AM 07/29/2022    8:34 AM 02/01/2022   10:58 AM  CBC  WBC 4.0 - 10.5 K/uL 6.5  6.7  6.7   Hemoglobin 12.0 - 15.0 g/dL 88.6  87.0  87.9   Hematocrit 36.0 - 46.0 % 33.8  38.6  36.0   Platelets 150 - 400 K/uL 193  254  316        Latest Ref Rng & Units 07/31/2023   11:59 AM 10/31/2022    8:27 AM  07/29/2022    8:34 AM  BMP  Glucose 70 - 99 mg/dL 840  895  852   BUN 8 - 23 mg/dL 14  17  18    Creatinine 0.44 - 1.00 mg/dL 9.30  9.34  9.33   BUN/Creat Ratio 12 - 28  26    Sodium 135 - 145 mmol/L 136  142  139   Potassium 3.5 - 5.1 mmol/L 3.4  4.7  4.2   Chloride 98 - 111 mmol/L 103  104  105   CO2 22 - 32 mmol/L 29  23  27    Calcium  8.9 - 10.3 mg/dL 8.7  9.4  9.1       Latest Ref Rng & Units 07/31/2023   11:59 AM 10/31/2022    8:27 AM 07/29/2022    8:34 AM  CMP  Glucose 70 - 99 mg/dL 840  895  852   BUN 8 - 23 mg/dL 14  17  18    Creatinine 0.44 - 1.00 mg/dL 9.30  9.34  9.33   Sodium 135 - 145 mmol/L 136  142  139   Potassium 3.5 - 5.1 mmol/L 3.4  4.7  4.2   Chloride 98 - 111 mmol/L 103  104  105   CO2 22 - 32 mmol/L 29  23  27    Calcium  8.9 - 10.3 mg/dL 8.7  9.4  9.1   Total Protein 6.5 - 8.1 g/dL 6.8  6.7  6.9   Total Bilirubin 0.0 - 1.2 mg/dL 0.7  0.4  0.5   Alkaline Phos 38 - 126 U/L 172  79  67   AST 15 - 41 U/L 78  25  22   ALT 0 - 44 U/L 91  22  19     Lab Results  Component Value Date   CHOL 147 10/31/2022   HDL 71 10/31/2022   LDLCALC 53 10/31/2022   LDLDIRECT 51 10/31/2022   TRIG 134 10/31/2022   No results for input(s): LIPOA in the last 8760 hours. No components found for: NTPROBNP No results for input(s): PROBNP in the last 8760 hours. No results for input(s): TSH in the last 8760 hours.  Physical Exam:    Today's Vitals   10/12/23 1331  BP: (!) 146/76  Pulse: 80  Resp: 16  SpO2: 92%  Weight: 186 lb 3.2 oz (84.5 kg)  Height: 5' 2 (1.575 m)   Body mass index is 34.06 kg/m. Wt Readings from Last 3 Encounters:  10/12/23 186 lb 3.2 oz (84.5 kg)  08/03/23 193 lb (87.5 kg)  02/14/23 187 lb (84.8 kg)    Physical Exam   Impression & Recommendation(s):  Impression:   ICD-10-CM   1. Coronary atherosclerosis due to calcified coronary lesion  I25.10 EKG 12-Lead   I25.84        Recommendation(s):  ***  Orders Placed:  Orders Placed  This Encounter  Procedures   EKG 12-Lead     Final Medication List:   No orders of the defined types were placed in this encounter.   Medications Discontinued During  This Encounter  Medication Reason   timolol  (TIMOPTIC ) 0.5 % ophthalmic solution Completed Course   dorzolamide  (TRUSOPT ) 2 % ophthalmic solution Completed Course     Current Outpatient Medications:    ALPRAZolam  (XANAX ) 0.25 MG tablet, Take 0.125 mg by mouth at bedtime., Disp: , Rfl:    aspirin  EC (ASPIRIN  LOW DOSE) 81 MG tablet, Take 1 tablet (81 mg total) by mouth daily. SWALLOW WHOLE., Disp: 60 tablet, Rfl: 0   Cholecalciferol (VITAMIN D3) 25 MCG (1000 UT) CAPS, Take 1,000 Units by mouth daily., Disp: , Rfl:    citalopram  (CELEXA ) 20 MG tablet, Take 20 mg by mouth daily., Disp: , Rfl:    Cyanocobalamin  (VITAMIN B 12 PO), Take 1 tablet by mouth daily in the afternoon., Disp: , Rfl:    ezetimibe  (ZETIA ) 10 MG tablet, Take 1 tablet (10 mg total) by mouth daily., Disp: 90 tablet, Rfl: 3   latanoprost  (XALATAN ) 0.005 % ophthalmic solution, Place 1 drop into both eyes at bedtime., Disp: , Rfl:    meloxicam (MOBIC) 15 MG tablet, Take 15 mg by mouth daily., Disp: , Rfl:    Multiple Vitamin (MULTIVITAMIN) tablet, Take 1 tablet by mouth daily., Disp: , Rfl:    Multiple Vitamins-Minerals (PRESERVISION AREDS 2) CAPS, Take 1 capsule by mouth daily., Disp: , Rfl:    niacin  500 MG tablet, Take 1,000 mg by mouth daily., Disp: , Rfl:    Probiotic Product (PROBIOTIC-10 PO), Take 1 capsule by mouth daily., Disp: , Rfl:    rosuvastatin  (CRESTOR ) 20 MG tablet, Take 20 mg by mouth daily., Disp: , Rfl:    Turmeric 500 MG CAPS, Take 500 mg by mouth daily., Disp: , Rfl:   Consent:   NA  Disposition:   ***  Her questions and concerns were addressed to her satisfaction. She voices understanding of the recommendations provided during this encounter.    Signed, Madonna Michele HAS, [facility]  A Division of [facility] [address], [place], [place] [address]  [place], [place] [address] 10/12/2023 1:51 PM

## 2023-10-13 ENCOUNTER — Encounter: Payer: Self-pay | Admitting: Cardiology

## 2023-10-14 ENCOUNTER — Emergency Department (HOSPITAL_COMMUNITY)

## 2023-10-14 ENCOUNTER — Emergency Department (HOSPITAL_COMMUNITY)
Admission: EM | Admit: 2023-10-14 | Discharge: 2023-10-14 | Disposition: A | Discharge status: Home / Self Care | Attending: Emergency Medicine | Admitting: Emergency Medicine

## 2023-10-14 DIAGNOSIS — I2510 Atherosclerotic heart disease of native coronary artery without angina pectoris: Secondary | ICD-10-CM | POA: Diagnosis not present

## 2023-10-14 DIAGNOSIS — R251 Tremor, unspecified: Secondary | ICD-10-CM | POA: Diagnosis not present

## 2023-10-14 DIAGNOSIS — R202 Paresthesia of skin: Secondary | ICD-10-CM | POA: Diagnosis not present

## 2023-10-14 DIAGNOSIS — R519 Headache, unspecified: Secondary | ICD-10-CM | POA: Diagnosis not present

## 2023-10-14 DIAGNOSIS — I672 Cerebral atherosclerosis: Secondary | ICD-10-CM | POA: Diagnosis not present

## 2023-10-14 DIAGNOSIS — Z7982 Long term (current) use of aspirin: Secondary | ICD-10-CM | POA: Insufficient documentation

## 2023-10-14 DIAGNOSIS — Z9889 Other specified postprocedural states: Secondary | ICD-10-CM | POA: Diagnosis not present

## 2023-10-14 DIAGNOSIS — M549 Dorsalgia, unspecified: Secondary | ICD-10-CM | POA: Diagnosis not present

## 2023-10-14 DIAGNOSIS — R9431 Abnormal electrocardiogram [ECG] [EKG]: Secondary | ICD-10-CM | POA: Diagnosis not present

## 2023-10-14 DIAGNOSIS — J9811 Atelectasis: Secondary | ICD-10-CM | POA: Diagnosis not present

## 2023-10-14 LAB — CBC WITH DIFFERENTIAL/PLATELET
Abs Immature Granulocytes: 0.02 K/uL (ref 0.00–0.07)
Basophils Absolute: 0 K/uL (ref 0.0–0.1)
Basophils Relative: 1 %
Eosinophils Absolute: 0 K/uL (ref 0.0–0.5)
Eosinophils Relative: 1 %
HCT: 42.3 % (ref 36.0–46.0)
Hemoglobin: 13.8 g/dL (ref 12.0–15.0)
Immature Granulocytes: 0 %
Lymphocytes Relative: 25 %
Lymphs Abs: 1.6 K/uL (ref 0.7–4.0)
MCH: 29.1 pg (ref 26.0–34.0)
MCHC: 32.6 g/dL (ref 30.0–36.0)
MCV: 89.2 fL (ref 80.0–100.0)
Monocytes Absolute: 0.4 K/uL (ref 0.1–1.0)
Monocytes Relative: 7 %
Neutro Abs: 4.2 K/uL (ref 1.7–7.7)
Neutrophils Relative %: 66 %
Platelets: 212 K/uL (ref 150–400)
RBC: 4.74 MIL/uL (ref 3.87–5.11)
RDW: 13.9 % (ref 11.5–15.5)
WBC: 6.3 K/uL (ref 4.0–10.5)
nRBC: 0 % (ref 0.0–0.2)

## 2023-10-14 LAB — COMPREHENSIVE METABOLIC PANEL WITH GFR
ALT: 42 U/L (ref 0–44)
AST: 34 U/L (ref 15–41)
Albumin: 4.5 g/dL (ref 3.5–5.0)
Alkaline Phosphatase: 108 U/L (ref 38–126)
Anion gap: 9 (ref 5–15)
BUN: 18 mg/dL (ref 8–23)
CO2: 27 mmol/L (ref 22–32)
Calcium: 9.1 mg/dL (ref 8.9–10.3)
Chloride: 101 mmol/L (ref 98–111)
Creatinine, Ser: 0.62 mg/dL (ref 0.44–1.00)
GFR, Estimated: >60 mL/min (ref >60)
Glucose, Bld: 185 mg/dL — ABNORMAL HIGH (ref 70–99)
Potassium: 3.8 mmol/L (ref 3.5–5.1)
Sodium: 137 mmol/L (ref 135–145)
Total Bilirubin: 0.7 mg/dL (ref 0.0–1.2)
Total Protein: 8.2 g/dL — ABNORMAL HIGH (ref 6.5–8.1)

## 2023-10-14 LAB — URINALYSIS, ROUTINE W REFLEX MICROSCOPIC
Bilirubin Urine: NEGATIVE
Glucose, UA: NEGATIVE mg/dL
Hgb urine dipstick: NEGATIVE
Ketones, ur: NEGATIVE mg/dL
Leukocytes,Ua: NEGATIVE
Nitrite: NEGATIVE
Protein, ur: NEGATIVE mg/dL
Specific Gravity, Urine: 1.011 (ref 1.005–1.030)
pH: 6 (ref 5.0–8.0)

## 2023-10-14 LAB — MAGNESIUM: Magnesium: 2.1 mg/dL (ref 1.7–2.4)

## 2023-10-14 LAB — TROPONIN I (HIGH SENSITIVITY): Troponin I (High Sensitivity): 7 ng/L (ref <18)

## 2023-10-14 MED ORDER — SODIUM CHLORIDE 0.9 % IV BOLUS
1000.0000 mL | Freq: Once | INTRAVENOUS | Status: AC
Start: 2023-10-14 — End: 2023-10-14
  Administered 2023-10-14: 1000 mL via INTRAVENOUS

## 2023-10-14 NOTE — ED Triage Notes (Signed)
 Patient complaining of hands shaking, tremor like shakes. Patient states she has chronic back pain and now has a headache. States she has been having weird symptoms since having a virus in February. Headache 8/10, took Tylenol  before coming to ED. Patient does have history of Migraines but has been a long time. Denies any numbness, weakness, or tingling at triage.

## 2023-10-14 NOTE — ED Provider Notes (Signed)
 [place] EMERGENCY DEPARTMENT AT [facility] Provider Note   CSN: [id number] Arrival date & time: 10/14/23  1510     Patient presents with: No chief complaint on file.   Priscilla Taylor is a 78 y.o. female.  With a history of left carcinoid tumor status postresection, CAD, arthritis who presents to the ED for neurologic symptoms.  Patient has experienced episodic tremoring in both hands for the last few months.  Her primary care doctor attributed this symptom as a potential adverse effect of the oxybutynin and the patient discontinued this medication about 6 days ago.  She has noted less tremoring but today tremors in both hands along with numbness and tingling in the left hand which were new symptoms for her.  She also experienced severe right-sided headache for which she took 2 Tylenol  at home.  It was initially 8 out of 10 now 5 out of 10.  No other new symptoms today.  Daughter at bedside does voice some concern for generalized weakness and fatigue over the last couple months.  No fevers chills chest pain shortness of breath.  Patient has had sensation of heart pounding for last couple weeks.  No heart pounding symptoms palpitations today.  She does remark that she has been urinating more frequently than usual especially at night.  No dysuria abdominal pain nausea vomiting or diarrhea.   HPI     Prior to Admission medications   Medication Sig Start Date End Date Taking? Authorizing Provider  ALPRAZolam  (XANAX ) 0.25 MG tablet Take 0.125 mg by mouth at bedtime.    [provider]  aspirin  EC (ASPIRIN  LOW DOSE) 81 MG tablet Take 1 tablet (81 mg total) by mouth daily. SWALLOW WHOLE. 09/01/23   Tolia, Sunit, DO  Cholecalciferol (VITAMIN D3) 25 MCG (1000 UT) CAPS Take 1,000 Units by mouth daily.    [provider]  citalopram  (CELEXA ) 20 MG tablet Take 20 mg by mouth daily.    [provider]  Cyanocobalamin  (VITAMIN B 12 PO) Take 1 tablet by mouth  daily in the afternoon.    [provider]  ezetimibe  (ZETIA ) 10 MG tablet Take 1 tablet (10 mg total) by mouth daily. 09/23/22 10/12/23  Tolia, Sunit, DO  latanoprost  (XALATAN ) 0.005 % ophthalmic solution Place 1 drop into both eyes at bedtime.    [provider]  meloxicam (MOBIC) 15 MG tablet Take 15 mg by mouth daily. 09/20/21   [provider]  Multiple Vitamin (MULTIVITAMIN) tablet Take 1 tablet by mouth daily.    [provider]  Multiple Vitamins-Minerals (PRESERVISION AREDS 2) CAPS Take 1 capsule by mouth daily.    [provider]  niacin  500 MG tablet Take 1,000 mg by mouth daily.    [provider]  Probiotic Product (PROBIOTIC-10 PO) Take 1 capsule by mouth daily.    [provider]  rosuvastatin  (CRESTOR ) 20 MG tablet Take 20 mg by mouth daily.    [provider]  Turmeric 500 MG CAPS Take 500 mg by mouth daily.    [provider]    Allergies: Sulfa antibiotics, Amoxicillin, Lipitor [atorvastatin ], and Zocor [simvastatin]    Review of Systems  Updated Vital Signs BP (!) 148/70   Pulse 92   Temp 97.7 F (36.5 C) (Oral)   Resp 16   SpO2 97%   Physical Exam Vitals and nursing note reviewed.  HENT:     Head: Normocephalic and atraumatic.  Eyes:     Pupils: Pupils  are equal, round, and reactive to light.  Cardiovascular:     Rate and Rhythm: Normal rate and regular rhythm.  Pulmonary:     Effort: Pulmonary effort is normal.     Breath sounds: Normal breath sounds.  Abdominal:     Palpations: Abdomen is soft.     Tenderness: There is no abdominal tenderness.  Skin:    General: Skin is warm and dry.  Neurological:     General: No focal deficit present.     Mental Status: She is alert.     Sensory: No sensory deficit.     Motor: No weakness.     Comments: Clear fluent speech Awake alert oriented x 4 No dysmetria on finger-to-nose Visual fields grossly intact  Psychiatric:        Mood  and Affect: Mood normal.     (all labs ordered are listed, but only abnormal results are displayed) Labs Reviewed  COMPREHENSIVE METABOLIC PANEL WITH GFR - Abnormal; Notable for the following components:      Result Value   Glucose, Bld 185 (*)    Total Protein 8.2 (*)    All other components within normal limits  URINALYSIS, ROUTINE W REFLEX MICROSCOPIC - Abnormal; Notable for the following components:   Color, Urine STRAW (*)    All other components within normal limits  CBC WITH DIFFERENTIAL/PLATELET  MAGNESIUM   TROPONIN I (HIGH SENSITIVITY)  TROPONIN I (HIGH SENSITIVITY)    EKG: EKG Interpretation Date/Time:  Saturday October 14 2023 16:33:40 EDT Ventricular Rate:  84 PR Interval:  249 QRS Duration:  99 QT Interval:  402 QTC Calculation: 476 R Axis:   46  Text Interpretation: Sinus rhythm Prolonged PR interval Borderline ST depression, anterolateral leads Confirmed by Pamella Sharper [phone]) on 10/14/2023 5:03:58 PM  Radiology: CT Head Wo Contrast Result Date: 10/14/2023 CLINICAL DATA:  Headache, new onset (Age >= 51y) EXAM: CT HEAD WITHOUT CONTRAST TECHNIQUE: Contiguous axial images were obtained from the base of the skull through the vertex without intravenous contrast. RADIATION DOSE REDUCTION: This exam was performed according to the departmental dose-optimization program which includes automated exposure control, adjustment of the mA and/or kV according to patient size and/or use of iterative reconstruction technique. COMPARISON:  Head CT 11/23/2021 FINDINGS: Brain: No intracranial hemorrhage, mass effect, or midline shift. No hydrocephalus. The basilar cisterns are patent. No evidence of territorial infarct or acute ischemia. No extra-axial or intracranial fluid collection. Vascular: Atherosclerosis of skullbase vasculature without hyperdense vessel or abnormal calcification. Skull: No fracture or focal lesion. Sinuses/Orbits: No acute finding. Other: None. IMPRESSION: No acute  intracranial abnormality. Electronically Signed   By: Andrea Gasman M.D.   On: 10/14/2023 17:16   DG Chest Portable 1 View Result Date: 10/14/2023 CLINICAL DATA:  Carcinoid. Status post resection. Hand shaking with tremors. Chronic back pain EXAM: PORTABLE CHEST 1 VIEW COMPARISON:  X-ray 04/05/2022. contrast chest CT 07/31/2023 FINDINGS: Surgical changes of prior left lobectomy. Hyperinflation. Mild linear opacity at the lung base on the right likely scar or atelectasis. No consolidation, pneumothorax or effusion. No edema. Normal cardiopericardial silhouette with calcified aorta. Under penetrated radiograph. Overlapping artifact from the patient's clothing. Degenerative changes of the spine. IMPRESSION: Hyperinflation.  Mild right basilar scar atelectasis. Under penetrated radiograph. Electronically Signed   By: Ranell Bring M.D.   On: 10/14/2023 16:56     Procedures   Medications Ordered in the ED  sodium chloride  0.9 % bolus 1,000 mL (1,000 mLs Intravenous New Bag/Given 10/14/23 1711)  Clinical Course as of 10/14/23 1915  Sat Oct 14, 2023  1912 No dysrhythmia on EKG.  Laboratory workup shows hyperglycemia 185 otherwise unremarkable.  No UTI.  He has a troponin of 7 not consistent with ACS.  Chest x-ray without acute disease.  CT head unremarkable.  Informed patient and daughter at bedside of ED workup findings.  Offered her MRI to rule out acute stroke although lower suspicion for this at this time.  Patient voices understanding and wants to hold off on MRI at this time.  Feels well enough without worsening of symptoms.  Will follow-up with her primary care doctor [MP]    Clinical Course User Index [MP] Pamella Ozell LABOR, DO                                 Medical Decision Making 78 year old female with history as above presenting with concern for concern of hand tremoring and left upper extremity numbness tingling.  Headache started today as well.  Right-sided headache.  NIH stroke  scale score of 0 - on my initial assessment.  Afebrile slightly hypertensive today.  No other findings on my physical exam.  Considering symptoms of headache, new onset numbness ting of the left upper extremity differential diagnosis would include TIA/CVA, partial seizure, complex migraine, electrolyte imbalance, dysrhythmia, atypical ACS elderly female urinary tract infection, recurrence of tumor, pneumonia, anxiety.  Will obtain CT head without contrast to start, chest x-ray, UA, laboratory workup including oxygen troponin.  Will provide IV fluids and reassess.  Will hold off on analgesics at this time as headache is improving after taking Tylenol  at home  Amount and/or Complexity of Data Reviewed Labs: ordered. Radiology: ordered.        Final diagnoses:  Acute nonintractable headache, unspecified headache type  Paresthesia    ED Discharge Orders     None          Pamella Ozell LABOR, DO 10/14/23 1915

## 2023-10-14 NOTE — Discharge Instructions (Signed)
 You were seen in the emergency department for headache and numbness on the left arm The CAT scan of your head did not show any evidence of stroke Your blood work showed high blood sugar but otherwise looked okay You do not have a UTI We offered an MRI to fully evaluate for stroke but you did not want this test at the time It is important that you follow-up with to discuss today's visit and further testing Return to the emergency room for recurrent numbness, tingling on one side, weakness in one-sided body, severe headaches or any other concerns

## 2023-10-14 NOTE — ED Notes (Signed)
 Pt provided insufficient amount of urine for specimen.

## 2023-10-25 ENCOUNTER — Ambulatory Visit: Admitting: Cardiology

## 2023-10-27 ENCOUNTER — Other Ambulatory Visit: Payer: Self-pay | Admitting: Cardiology

## 2023-10-27 DIAGNOSIS — I2510 Atherosclerotic heart disease of native coronary artery without angina pectoris: Secondary | ICD-10-CM

## 2023-10-27 DIAGNOSIS — I700 Atherosclerosis of aorta: Secondary | ICD-10-CM

## 2023-10-27 DIAGNOSIS — R931 Abnormal findings on diagnostic imaging of heart and coronary circulation: Secondary | ICD-10-CM

## 2023-10-30 DIAGNOSIS — Z09 Encounter for follow-up examination after completed treatment for conditions other than malignant neoplasm: Secondary | ICD-10-CM | POA: Diagnosis not present

## 2023-10-30 DIAGNOSIS — Z6832 Body mass index (BMI) 32.0-32.9, adult: Secondary | ICD-10-CM | POA: Diagnosis not present

## 2023-10-30 DIAGNOSIS — R350 Frequency of micturition: Secondary | ICD-10-CM | POA: Diagnosis not present

## 2023-11-02 ENCOUNTER — Telehealth: Payer: Self-pay | Admitting: Cardiology

## 2023-11-02 DIAGNOSIS — I2510 Atherosclerotic heart disease of native coronary artery without angina pectoris: Secondary | ICD-10-CM

## 2023-11-02 DIAGNOSIS — I700 Atherosclerosis of aorta: Secondary | ICD-10-CM

## 2023-11-02 DIAGNOSIS — E782 Mixed hyperlipidemia: Secondary | ICD-10-CM

## 2023-11-02 MED ORDER — EZETIMIBE 10 MG PO TABS: 10.0000 mg | ORAL_TABLET | Freq: Every day | ORAL | 3 refills | Status: AC

## 2023-11-02 NOTE — Telephone Encounter (Signed)
*  STAT* If patient is at the pharmacy, call can be transferred to refill team.   1. Which medications need to be refilled? (please list name of each medication and dose if known) ezetimibe  (ZETIA ) 10 MG tablet (Expired)    2. Would you like to learn more about the convenience, safety, & potential cost savings by using the [facility]?     3. Are you open to using the [facility] (Type [facility].  ).   4. Which pharmacy/location (including street and city if local pharmacy) is medication to be sent to? [id number] - [place], [place] - [address]    5. Do they need a 30 day or 90 day supply? 90 day

## 2023-11-20 ENCOUNTER — Ambulatory Visit (HOSPITAL_COMMUNITY)
Admission: RE | Admit: 2023-11-20 | Discharge: 2023-11-20 | Disposition: A | Discharge status: Home / Self Care | Source: Ambulatory Visit | Attending: Cardiology | Admitting: Cardiology

## 2023-11-20 DIAGNOSIS — I2584 Coronary atherosclerosis due to calcified coronary lesion: Secondary | ICD-10-CM | POA: Insufficient documentation

## 2023-11-20 DIAGNOSIS — I2510 Atherosclerotic heart disease of native coronary artery without angina pectoris: Secondary | ICD-10-CM | POA: Diagnosis not present

## 2023-11-20 LAB — ECHOCARDIOGRAM COMPLETE
Area-P 1/2: 3.33 cm2
S' Lateral: 2.36 cm

## 2023-11-26 DIAGNOSIS — E669 Obesity, unspecified: Secondary | ICD-10-CM | POA: Diagnosis not present

## 2023-11-26 DIAGNOSIS — E782 Mixed hyperlipidemia: Secondary | ICD-10-CM | POA: Diagnosis not present

## 2023-11-26 DIAGNOSIS — F411 Generalized anxiety disorder: Secondary | ICD-10-CM | POA: Diagnosis not present

## 2023-11-26 DIAGNOSIS — M459 Ankylosing spondylitis of unspecified sites in spine: Secondary | ICD-10-CM | POA: Diagnosis not present

## 2023-12-03 ENCOUNTER — Ambulatory Visit: Payer: Self-pay | Admitting: Cardiology

## 2023-12-12 DIAGNOSIS — H6993 Unspecified Eustachian tube disorder, bilateral: Secondary | ICD-10-CM | POA: Diagnosis not present

## 2023-12-26 DIAGNOSIS — F411 Generalized anxiety disorder: Secondary | ICD-10-CM | POA: Diagnosis not present

## 2023-12-26 DIAGNOSIS — E669 Obesity, unspecified: Secondary | ICD-10-CM | POA: Diagnosis not present

## 2023-12-26 DIAGNOSIS — E782 Mixed hyperlipidemia: Secondary | ICD-10-CM | POA: Diagnosis not present

## 2023-12-26 DIAGNOSIS — M459 Ankylosing spondylitis of unspecified sites in spine: Secondary | ICD-10-CM | POA: Diagnosis not present

## 2024-07-22 ENCOUNTER — Other Ambulatory Visit

## 2024-07-29 ENCOUNTER — Ambulatory Visit: Admitting: Internal Medicine
